# Patient Record
Sex: Male | Born: 1985 | Race: Black or African American | Hispanic: No | Marital: Single | Smoking: Former smoker
Health system: Southern US, Community
[De-identification: ages and names within clinical notes are randomized; demographics above are authoritative.]

## PROBLEM LIST (undated history)

## (undated) DIAGNOSIS — E119 Type 2 diabetes mellitus without complications: Secondary | ICD-10-CM

## (undated) DIAGNOSIS — I10 Essential (primary) hypertension: Secondary | ICD-10-CM

## (undated) DIAGNOSIS — K760 Fatty (change of) liver, not elsewhere classified: Secondary | ICD-10-CM

## (undated) DIAGNOSIS — E785 Hyperlipidemia, unspecified: Secondary | ICD-10-CM

## (undated) HISTORY — DX: Type 2 diabetes mellitus without complications: E11.9

## (undated) HISTORY — DX: Essential (primary) hypertension: I10

## (undated) HISTORY — DX: Fatty (change of) liver, not elsewhere classified: K76.0

## (undated) HISTORY — DX: Hyperlipidemia, unspecified: E78.5

---

## 2014-10-15 HISTORY — PX: FRACTURE SURGERY: SHX138

## 2016-02-06 ENCOUNTER — Ambulatory Visit (INDEPENDENT_AMBULATORY_CARE_PROVIDER_SITE_OTHER): Payer: Worker's Compensation

## 2016-02-06 ENCOUNTER — Encounter: Payer: Self-pay | Admitting: Family Medicine

## 2016-02-06 ENCOUNTER — Ambulatory Visit (INDEPENDENT_AMBULATORY_CARE_PROVIDER_SITE_OTHER): Payer: Worker's Compensation | Admitting: Family Medicine

## 2016-02-06 VITALS — BP 132/82 | HR 80 | Temp 97.0°F | Ht 79.0 in | Wt 351.0 lb

## 2016-02-06 DIAGNOSIS — M545 Low back pain: Secondary | ICD-10-CM

## 2016-02-06 DIAGNOSIS — M25561 Pain in right knee: Secondary | ICD-10-CM

## 2016-02-06 MED ORDER — CYCLOBENZAPRINE HCL 10 MG PO TABS: 10.0000 mg | ORAL_TABLET | Freq: Three times a day (TID) | ORAL | Status: DC | PRN

## 2016-02-06 NOTE — Progress Notes (Signed)
BP 132/82 mmHg  Pulse 80  Temp(Src) 97 F (36.1 C) (Oral)  Ht 6\' 7"  (2.007 m)  Wt 351 lb (159.213 kg)  BMI 39.53 kg/m2   Subjective:    Patient ID: Derek Decker, male    DOB: 07/31/1986, 30 y.o.   MRN: [id number]  HPI: Derek Decker is a 30 y.o. male presenting on 02/06/2016 for Worker's Comp Accident   HPI Worker's Comp., back pain and right leg pain Injury was on 02/04/2016, a short from [employer]. 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 Also when he fell he landed on his right side of his lower back on the concrete. He has been having sharp pains that are 8 out of 10 in his right lower back and hip and right thigh pain with certain movements but the worst of it is the back. He has taken some Advil just yesterday. He denies any numbness or weakness or pain shooting down either leg. He has had back issues previously but never quite like what he is having currently.  Relevant past medical, surgical, family and social history reviewed and updated as indicated. Interim medical history since our last visit reviewed. Allergies and medications reviewed and updated.  Review of Systems  Constitutional: Negative for fever and chills.  HENT: Negative for ear discharge and ear pain.   Eyes: Negative for discharge and visual disturbance.  Respiratory: Negative for cough, shortness of breath and wheezing.   Cardiovascular: Negative for chest pain and leg swelling.  Gastrointestinal: Negative for abdominal pain, diarrhea and constipation.  Genitourinary: Negative for difficulty urinating.  Musculoskeletal: Positive for myalgias, back pain and arthralgias. Negative for gait problem.  Skin: Negative for rash.  Neurological: Negative for syncope, weakness, light-headedness, numbness and headaches.  All other systems reviewed and are negative.   Per HPI unless specifically indicated above       Medication List       This list is accurate as of: 02/06/16 11:19 AM.  Always use your most recent med list.               acetaminophen 325 MG tablet  Commonly known as:  TYLENOL  Take 650 mg by mouth every 6 (six) hours as needed.     cyclobenzaprine 10 MG tablet  Commonly known as:  FLEXERIL  Take 1 tablet (10 mg total) by mouth 3 (three) times daily as needed for muscle spasms.     lisinopril-hydrochlorothiazide 10-12.5 MG tablet  Commonly known as:  PRINZIDE,ZESTORETIC  Take 1 tablet by mouth daily.           Objective:    BP 132/82 mmHg  Pulse 80  Temp(Src) 97 F (36.1 C) (Oral)  Ht 6\' 7"  (2.007 m)  Wt 351 lb (159.213 kg)  BMI 39.53 kg/m2  Wt Readings from Last 3 Encounters:  02/06/16 351 lb (159.213 kg)    Physical Exam  Constitutional: He is oriented to person, place, and time. He appears well-developed and well-nourished. No distress.  Eyes: Conjunctivae and EOM are normal. Pupils are equal, round, and reactive to light. Right eye exhibits no discharge. No scleral icterus.  Cardiovascular: Normal rate, regular rhythm, normal heart sounds and intact distal pulses.   No murmur heard. Pulmonary/Chest: Effort normal and breath sounds normal. No respiratory distress. He has no wheezes.  Musculoskeletal: Normal range of motion. He exhibits no  edema.       Lumbar back: He exhibits tenderness, pain and spasm. He exhibits normal range of motion, no bony tenderness, no swelling and no laceration.       Back:       Right upper leg: He exhibits tenderness.       Legs: Neurological: He is alert and oriented to person, place, and time. Coordination normal.  Skin: Skin is warm and dry. No rash noted. He is not diaphoretic.  Psychiatric: He has a normal mood and affect. His behavior is normal.  Nursing note and vitals reviewed.   Lumbar x-ray: Negative for any acute bony abnormalities, with final report radiologist. Right knee x-ray: Negative for any acute bony  abnormality, early signs of osteoarthritis, await final read by radiology.    Assessment & Plan:       Problem List Items Addressed This Visit    None    Visit Diagnoses    Low back pain, unspecified back pain laterality, with sciatica presence unspecified    -  Primary    Low back pain is the worst of his, will do one week of no lifting greater than 20 pounds and no pushing or pulling greater than 40 pounds consistently.    Relevant Medications    acetaminophen (TYLENOL) 325 MG tablet    cyclobenzaprine (FLEXERIL) 10 MG tablet    Other Relevant Orders    DG Lumbar Spine 2-3 Views    Knee pain, acute, right        Relevant Orders    DG Knee 1-2 Views Right        Follow up plan: Return if symptoms worsen or fail to improve.  Counseling provided for all of the vaccine components Orders Placed This Encounter  Procedures  . DG Knee 1-2 Views Right  . DG Lumbar Spine 2-3 Views    Arville Care, MD [facility] 02/06/2016, 11:19 AM

## 2016-02-06 NOTE — Patient Instructions (Signed)
Return as needed, Work restrictions of no lifting greater than 20 pounds consistently and no pushing or pulling greater than 40 pounds consistently. Stretching, heat, anti-inflammatory, muscle relaxer

## 2016-03-22 ENCOUNTER — Encounter: Payer: Self-pay | Admitting: Family Medicine

## 2016-03-22 ENCOUNTER — Ambulatory Visit: Payer: Worker's Compensation | Admitting: Family Medicine

## 2016-03-22 VITALS — BP 128/81 | HR 93 | Temp 97.4°F | Ht 79.0 in | Wt 349.9 lb

## 2016-03-22 DIAGNOSIS — M545 Low back pain, unspecified: Secondary | ICD-10-CM

## 2016-03-22 MED ORDER — PREDNISONE 20 MG PO TABS: ORAL_TABLET | ORAL | Status: DC

## 2016-03-22 NOTE — Progress Notes (Signed)
BP 128/81 mmHg  Pulse 93  Temp(Src) 97.4 F (36.3 C) (Oral)  Ht  (2.007 m)  Wt 349 lb 13.8 oz (158.695 kg)  BMI 39.40 kg/m2   Subjective:    Patient ID: Derek Decker, male    DOB: 1985/11/03, 30 y.o.   MRN: [id number]  HPI: Derek Decker is a 30 y.o. male presenting on 03/22/2016 for Back Pain   HPI Back pain Patient has low back pain that has been off and on and persistent for the past couple months. He went to urgent care in the emergency room over the weekend and had x-ray which showed that it was normal. He denies any tingling or numbness going down either leg.  Relevant past medical, surgical, family and social history reviewed and updated as indicated. Interim medical history since our last visit reviewed. Allergies and medications reviewed and updated.  Review of Systems  Constitutional: Negative for fever and chills.  HENT: Negative for ear discharge and ear pain.   Eyes: Negative for discharge and visual disturbance.  Respiratory: Negative for cough, shortness of breath and wheezing.   Cardiovascular: Negative for chest pain and leg swelling.  Gastrointestinal: Negative for abdominal pain, diarrhea and constipation.  Genitourinary: Negative for difficulty urinating.  Musculoskeletal: Positive for myalgias, back pain and arthralgias. Negative for gait problem.  Skin: Negative for rash.  Neurological: Negative for syncope, weakness, light-headedness, numbness and headaches.  All other systems reviewed and are negative.   Per HPI unless specifically indicated above     Medication List       This list is accurate as of: 03/22/16  3:22 PM.  Always use your most recent med list.               acetaminophen 325 MG tablet  Commonly known as:  TYLENOL  Take 650 mg by mouth every 6 (six) hours as needed.     cyclobenzaprine 10 MG tablet  Commonly known as:  FLEXERIL  Take 1 tablet (10 mg total) by mouth 3 (three) times daily as needed for muscle spasms.       lisinopril-hydrochlorothiazide 10-12.5 MG tablet  Commonly known as:  PRINZIDE,ZESTORETIC  Take 1 tablet by mouth daily.     predniSONE 20 MG tablet  Commonly known as:  DELTASONE  2 po at same time daily for 5 days           Objective:    BP 128/81 mmHg  Pulse 93  Temp(Src) 97.4 F (36.3 C) (Oral)  Ht  (2.007 m)  Wt 349 lb 13.8 oz (158.695 kg)  BMI 39.40 kg/m2  Wt Readings from Last 3 Encounters:  03/22/16 349 lb 13.8 oz (158.695 kg)  02/06/16 351 lb (159.213 kg)    Physical Exam  Constitutional: He is oriented to person, place, and time. He appears well-developed and well-nourished. No distress.  Eyes: Conjunctivae and EOM are normal. Pupils are equal, round, and reactive to light. Right eye exhibits no discharge. No scleral icterus.  Cardiovascular: Normal rate, regular rhythm, normal heart sounds and intact distal pulses.   No murmur heard. Pulmonary/Chest: Effort normal and breath sounds normal. No respiratory distress. He has no wheezes.  Musculoskeletal: Normal range of motion. He exhibits no edema.       Lumbar back: He exhibits tenderness (Bilateral paraspinal tenderness. Negative straight leg raise), pain and spasm. He exhibits normal range of motion, no bony tenderness, no swelling and no laceration.  Neurological: He is alert and  oriented to person, place, and time. Coordination normal.  Skin: Skin is warm and dry. No rash noted. He is not diaphoretic.  Psychiatric: He has a normal mood and affect. His behavior is normal.  Nursing note and vitals reviewed.   No results found for this or any previous visit.    Assessment & Plan:       Problem List Items Addressed This Visit    None    Visit Diagnoses    Bilateral low back pain without sciatica    -  Primary    Has tried Flexeril and will try a dose of prednisone, will send to physical therapy.    Relevant Medications    predniSONE (DELTASONE) 20 MG tablet    Other Relevant Orders    Ambulatory  referral to Physical Therapy        Follow up plan: Return if symptoms worsen or fail to improve.  Counseling provided for all of the vaccine components Orders Placed This Encounter  Procedures  . Ambulatory referral to Physical Therapy    Arville CareJoshua Dettinger, MD [facility] 03/22/2016, 3:22 PM

## 2016-06-19 ENCOUNTER — Encounter: Payer: Self-pay | Admitting: Family Medicine

## 2016-06-19 ENCOUNTER — Ambulatory Visit: Payer: Worker's Compensation | Admitting: Family Medicine

## 2016-06-19 VITALS — BP 128/79 | HR 86 | Temp 97.1°F | Ht 79.0 in | Wt 346.8 lb

## 2016-06-19 DIAGNOSIS — M5432 Sciatica, left side: Secondary | ICD-10-CM | POA: Diagnosis not present

## 2016-06-19 DIAGNOSIS — Z029 Encounter for administrative examinations, unspecified: Secondary | ICD-10-CM

## 2016-06-19 NOTE — Patient Instructions (Signed)
Continue your current medications, we will work on an orthopedic referral.

## 2016-06-19 NOTE — Progress Notes (Signed)
   HPI  Patient presents today for Worker's Compensation follow-up of left back pain.  Patient was injured on 02/04/2016 when fell under a conveyor belt which landed on his right mid thigh causing right leg and right back pain at that time.   He was sent for physical therapy on follow-up in June without much improvement of pain.  Pain over the last week has gotten much worse. He describes left-sided low back pain with radiation down his left lateral leg. It hurts worse with movement, twisting, and bending. He's been told there is no light duty at his job and he has been out of work for one week.  He states he's been on prednisone, Naprosyn, and Flexeril with not much improvement.  He's also had third injection at urgent care on 06/12/2016.  PMH: Smoking status noted ROS: Per HPI  Objective: BP 128/79   Pulse 86   Temp 97.1 F (36.2 C) (Oral)   Ht 6\' 7"  (2.007 m)   Wt (!) 346 lb 12.8 oz (157.3 kg)   BMI 39.07 kg/m  Gen: NAD, alert, cooperative with exam HEENT: NCAT, EOMI Ext: No edema, warm Neuro: Alert and oriented, No gross deficits  Msk: Left-sided and midline tenderness to palpation Pain with twisting to the left reproduces, also pain with bending forward at about 70.  Assessment and plan:  # Left-sided low back pain with sciatica Continue current medications Given work restrictions including not lifting or carrying more than 5 pounds or pushing or pulling more than 10 pounds. No bending Referring to orthopedic surgery.  Considering that his pain is better at rest I have not changed his medications.  If there is no light duty at his job and he will have to be out until he can be cleared by orthopedic surgery considering that his pain is unresolved after 1 week of no work and worsened with activity.    Orders Placed This Encounter  Procedures  . Ambulatory referral to Orthopedic Surgery    Referral Priority:   Routine    Referral Type:   Surgical    Referral  Reason:   Specialty Services Required    Requested Specialty:   Orthopedic Surgery    Number of Visits Requested:   1    Murtis SinkSam Irelynn Schermerhorn, MD [facility] 06/19/2016, 9:57 AM

## 2016-06-21 ENCOUNTER — Telehealth: Payer: Self-pay | Admitting: *Deleted

## 2016-06-21 NOTE — Telephone Encounter (Addendum)
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 Patient will need to have FMLA forms filled out since it is not a wc. I spoke with Bjorn Loserhonda in our billing department and she is going to check on this and verify with [employer] and then let us know.

## 2016-06-28 NOTE — Telephone Encounter (Signed)
Per [employer] they opened up the WC back up.

## 2018-04-21 DIAGNOSIS — I10 Essential (primary) hypertension: Secondary | ICD-10-CM | POA: Diagnosis not present

## 2018-04-21 DIAGNOSIS — L729 Follicular cyst of the skin and subcutaneous tissue, unspecified: Secondary | ICD-10-CM | POA: Diagnosis not present

## 2018-04-21 DIAGNOSIS — K611 Rectal abscess: Secondary | ICD-10-CM | POA: Diagnosis not present

## 2018-10-01 ENCOUNTER — Emergency Department (HOSPITAL_COMMUNITY)
Admission: EM | Admit: 2018-10-01 | Discharge: 2018-10-02 | Disposition: A | Discharge status: Home / Self Care | Payer: Self-pay | Attending: Emergency Medicine | Admitting: Emergency Medicine

## 2018-10-01 ENCOUNTER — Emergency Department (HOSPITAL_COMMUNITY): Payer: Self-pay

## 2018-10-01 ENCOUNTER — Encounter (HOSPITAL_COMMUNITY): Payer: Self-pay | Admitting: Emergency Medicine

## 2018-10-01 ENCOUNTER — Other Ambulatory Visit: Payer: Self-pay

## 2018-10-01 DIAGNOSIS — I10 Essential (primary) hypertension: Secondary | ICD-10-CM | POA: Insufficient documentation

## 2018-10-01 DIAGNOSIS — L0291 Cutaneous abscess, unspecified: Secondary | ICD-10-CM

## 2018-10-01 DIAGNOSIS — L02215 Cutaneous abscess of perineum: Secondary | ICD-10-CM | POA: Insufficient documentation

## 2018-10-01 DIAGNOSIS — Z87891 Personal history of nicotine dependence: Secondary | ICD-10-CM | POA: Insufficient documentation

## 2018-10-01 LAB — COMPREHENSIVE METABOLIC PANEL
ALT: 35 U/L (ref 0–44)
AST: 22 U/L (ref 15–41)
Albumin: 4.1 g/dL (ref 3.5–5.0)
Alkaline Phosphatase: 51 U/L (ref 38–126)
Anion gap: 10 (ref 5–15)
BUN: 9 mg/dL (ref 6–20)
CO2: 24 mmol/L (ref 22–32)
Calcium: 8.9 mg/dL (ref 8.9–10.3)
Chloride: 104 mmol/L (ref 98–111)
Creatinine, Ser: 0.87 mg/dL (ref 0.61–1.24)
GFR calc non Af Amer: >60 mL/min (ref >60)
Glucose, Bld: 204 mg/dL — ABNORMAL HIGH (ref 70–99)
POTASSIUM: 3.7 mmol/L (ref 3.5–5.1)
Sodium: 138 mmol/L (ref 135–145)
Total Bilirubin: 1.5 mg/dL — ABNORMAL HIGH (ref 0.3–1.2)
Total Protein: 7.2 g/dL (ref 6.5–8.1)

## 2018-10-01 LAB — CBC WITH DIFFERENTIAL/PLATELET
Abs Immature Granulocytes: 0.04 10*3/uL (ref 0.00–0.07)
BASOS PCT: 0 %
Basophils Absolute: 0 10*3/uL (ref 0.0–0.1)
EOS ABS: 0.1 10*3/uL (ref 0.0–0.5)
Eosinophils Relative: 1 %
HCT: 45.8 % (ref 39.0–52.0)
Hemoglobin: 14.4 g/dL (ref 13.0–17.0)
Immature Granulocytes: 0 %
Lymphocytes Relative: 18 %
Lymphs Abs: 1.8 10*3/uL (ref 0.7–4.0)
MCH: 29.1 pg (ref 26.0–34.0)
MCHC: 31.4 g/dL (ref 30.0–36.0)
MCV: 92.7 fL (ref 80.0–100.0)
Monocytes Absolute: 0.9 10*3/uL (ref 0.1–1.0)
Monocytes Relative: 8 %
Neutro Abs: 7.4 10*3/uL (ref 1.7–7.7)
Neutrophils Relative %: 73 %
PLATELETS: 227 10*3/uL (ref 150–400)
RBC: 4.94 MIL/uL (ref 4.22–5.81)
RDW: 11.8 % (ref 11.5–15.5)
WBC: 10.3 10*3/uL (ref 4.0–10.5)
nRBC: 0 % (ref 0.0–0.2)

## 2018-10-01 MED ORDER — LIDOCAINE HCL (PF) 2 % IJ SOLN
INTRAMUSCULAR | Status: AC
  Filled 2018-10-01: qty 10

## 2018-10-01 MED ORDER — TRAMADOL HCL 50 MG PO TABS: 50.0000 mg | ORAL_TABLET | Freq: Four times a day (QID) | ORAL | 0 refills | Status: DC | PRN

## 2018-10-01 MED ORDER — POVIDONE-IODINE 10 % EX SOLN
CUTANEOUS | Status: AC
  Filled 2018-10-01: qty 15

## 2018-10-01 MED ORDER — CIPROFLOXACIN HCL 250 MG PO TABS
500.0000 mg | ORAL_TABLET | Freq: Once | ORAL | Status: AC
  Administered 2018-10-01: 500 mg via ORAL
  Filled 2018-10-01: qty 2

## 2018-10-01 MED ORDER — HYDROMORPHONE HCL 1 MG/ML IJ SOLN
1.0000 mg | Freq: Once | INTRAMUSCULAR | Status: AC
  Administered 2018-10-01: 1 mg via INTRAVENOUS
  Filled 2018-10-01: qty 1

## 2018-10-01 MED ORDER — METRONIDAZOLE 500 MG PO TABS
500.0000 mg | ORAL_TABLET | Freq: Once | ORAL | Status: AC
  Administered 2018-10-01: 500 mg via ORAL
  Filled 2018-10-01: qty 1

## 2018-10-01 MED ORDER — SODIUM CHLORIDE 0.9 % IV SOLN
1.0000 g | Freq: Once | INTRAVENOUS | Status: AC
  Administered 2018-10-01: 1 g via INTRAVENOUS
  Filled 2018-10-01: qty 10

## 2018-10-01 MED ORDER — CIPROFLOXACIN HCL 500 MG PO TABS: 500.0000 mg | ORAL_TABLET | Freq: Two times a day (BID) | ORAL | 0 refills | Status: DC

## 2018-10-01 MED ORDER — LORAZEPAM 2 MG/ML IJ SOLN
0.5000 mg | Freq: Once | INTRAMUSCULAR | Status: AC
  Administered 2018-10-01: 0.5 mg via INTRAVENOUS
  Filled 2018-10-01: qty 1

## 2018-10-01 MED ORDER — METRONIDAZOLE 500 MG PO TABS: 500.0000 mg | ORAL_TABLET | Freq: Two times a day (BID) | ORAL | 0 refills | Status: DC

## 2018-10-01 MED ORDER — IOPAMIDOL (ISOVUE-300) INJECTION 61%
100.0000 mL | Freq: Once | INTRAVENOUS | Status: AC | PRN
  Administered 2018-10-01: 100 mL via INTRAVENOUS

## 2018-10-01 NOTE — ED Triage Notes (Signed)
Patient reports an abscess behind the left testicle. Onset 1 week ago. Patient reports edema today.

## 2018-10-01 NOTE — Discharge Instructions (Signed)
Clean area twice a day and sit in warm bathtub twice a day.  Follow-up with Dr. Lovell SheehanJenkins on Tuesday.  Return here sooner if any problems.  Also you need to get a family doctor to follow your sugar which was slightly elevated and your blood pressure at that is slightly elevated

## 2018-10-01 NOTE — ED Provider Notes (Signed)
[facility]   CSN: [id number] Arrival date & time: 10/01/18  1640     History   Chief Complaint Chief Complaint  Patient presents with  . Abscess    HPI Earma ReadingKevin T Hatchel is a 32 y.o. male.  Patient complains of swelling between his anus and scrotum.  The history is provided by the patient. No language interpreter was used.  Illness  This is a new problem. The current episode started more than 2 days ago. The problem occurs constantly. The problem has not changed since onset.Pertinent negatives include no chest pain, no abdominal pain and no headaches. Nothing aggravates the symptoms. Nothing relieves the symptoms. He has tried nothing for the symptoms. The treatment provided no relief.    Past Medical History:  Diagnosis Date  . Hypertension     There are no active problems to display for this patient.   Past Surgical History:  Procedure Laterality Date  . FRACTURE SURGERY Left 2016   left foot        Home Medications    Prior to Admission medications   Medication Sig Start Date End Date Taking? Authorizing Provider  ciprofloxacin (CIPRO) 500 MG tablet Take 1 tablet (500 mg total) by mouth 2 (two) times daily. One po bid x 7 days 10/01/18   Bethann BerkshireZammit, Placido Hangartner, MD  metroNIDAZOLE (FLAGYL) 500 MG tablet Take 1 tablet (500 mg total) by mouth 2 (two) times daily. One po bid x 7 days 10/01/18   Bethann BerkshireZammit, Yashira Offenberger, MD  traMADol (ULTRAM) 50 MG tablet Take 1 tablet (50 mg total) by mouth every 6 (six) hours as needed. 10/01/18   Bethann BerkshireZammit, Jalaine Riggenbach, MD    Family History Family History  Problem Relation Age of Onset  . Hypertension Mother   . Hypertension Father     Social History Social History   Tobacco Use  . Smoking status: Former Smoker    Packs/day: 0.25    Years: 3.00    Pack years: 0.75  . Smokeless tobacco: Never Used  Substance Use Topics  . Alcohol use: No  . Drug use: No     Allergies   Patient has no known  allergies.   Review of Systems Review of Systems  Constitutional: Negative for appetite change and fatigue.  HENT: Negative for congestion, ear discharge and sinus pressure.   Eyes: Negative for discharge.  Respiratory: Negative for cough.   Cardiovascular: Negative for chest pain.  Gastrointestinal: Negative for abdominal pain and diarrhea.  Genitourinary: Negative for frequency and hematuria.  Musculoskeletal: Negative for back pain.  Skin: Negative for rash.       Swelling and pain between his anus and scrotum  Neurological: Negative for seizures and headaches.  Psychiatric/Behavioral: Negative for hallucinations.     Physical Exam Updated Vital Signs BP (!) 149/92 (BP Location: Right Arm) Comment: told rn high bp  Pulse (!) 104 Comment: told rn high pulse   Temp 100.2 F (37.9 C) (Oral) Comment (Src): told rn temp up  Resp 20   Ht 6\' 7"  (2.007 m)   Wt (!) 148.3 kg   SpO2 97%   BMI 36.84 kg/m   Physical Exam Constitutional:      Appearance: He is well-developed.  HENT:     Head: Normocephalic.     Nose: Nose normal.  Eyes:     General: No scleral icterus.    Conjunctiva/sclera: Conjunctivae normal.  Neck:     Musculoskeletal: Neck supple.     Thyroid:  No thyromegaly.  Cardiovascular:     Rate and Rhythm: Normal rate and regular rhythm.     Heart sounds: No murmur. No friction rub. No gallop.   Pulmonary:     Breath sounds: No stridor. No wheezing or rales.  Chest:     Chest wall: No tenderness.  Abdominal:     General: There is no distension.     Tenderness: There is no abdominal tenderness. There is no rebound.  Genitourinary:    Comments: Patient has tenderness and swelling to the perineal area Musculoskeletal: Normal range of motion.  Lymphadenopathy:     Cervical: No cervical adenopathy.  Skin:    Findings: No erythema or rash.  Neurological:     Mental Status: He is oriented to person, place, and time.     Motor: No abnormal muscle tone.      Coordination: Coordination normal.  Psychiatric:        Behavior: Behavior normal.      ED Treatments / Results  Labs (all labs ordered are listed, but only abnormal results are displayed) Labs Reviewed  COMPREHENSIVE METABOLIC PANEL - Abnormal; Notable for the following components:      Result Value   Glucose, Bld 204 (*)    Total Bilirubin 1.5 (*)    All other components within normal limits  CBC WITH DIFFERENTIAL/PLATELET    EKG None  Radiology Ct Abdomen Pelvis W Contrast  Result Date: 10/01/2018 CLINICAL DATA:  Abscess behind the left testicle for the past week. Worsening edema today. EXAM: CT ABDOMEN AND PELVIS WITH CONTRAST TECHNIQUE: Multidetector CT imaging of the abdomen and pelvis was performed using the standard protocol following bolus administration of intravenous contrast. CONTRAST:  ISOVUE-300 IOPAMIDOL (ISOVUE-300) INJECTION 61% COMPARISON:  None. FINDINGS: Lower chest: Limited visualization of the lower thorax is negative for focal airspace opacity or pleural effusion. Normal heart size.  No pericardial effusion. Hepatobiliary: Normal hepatic contour. There is diffuse decreased attenuation of the hepatic parenchyma on this postcontrast examination suggestive of hepatic steatosis. No discrete hepatic lesions. Normal appearance of the gallbladder given degree distention. No radiopaque gallstones. No intra extrahepatic biliary duct dilatation. No ascites. Pancreas: Normal appearance of the pancreas. Spleen: Normal appearance of the spleen Adrenals/Urinary Tract: There is symmetric enhancement of the bilateral kidneys. No definite renal stones. No urine obstruction or perinephric stranding. Normal appearance the bilateral adrenal glands. Normal appearance of the urinary bladder given degree distention. Stomach/Bowel: Scattered colonic diverticulosis without evidence of superimposed acute diverticulitis. Moderate colonic stool burden without evidence of enteric  obstruction. Normal appearance of the terminal ileum and the retrocecal appendix. No pneumoperitoneum, pneumatosis or portal venous gas. Vascular/Lymphatic: Normal caliber of the abdominal aorta. The major branch vessels of the abdominal aorta appear patent on this non CTA examination. Scattered retroperitoneal lymph nodes are numerous though individually not enlarged by size criteria. No bulky retroperitoneal, mesenteric, pelvic or inguinal lymphadenopathy. Reproductive: No free fluid in the pelvic cul-de-sac. Other: There is a approximately 5.1 x 2.6 x 3.4 cm peripherally enhancing abscess within the perineum, anterior to the anus (axial image 109, series 2; sagittal image 72, series 6). Expected adjacent subcutaneous stranding. No subcutaneous emphysema. There is no definitive communication between this apparent perineal abscess and the undersurface of the base of the penis. Musculoskeletal: No acute or aggressive osseous abnormalities. Moderate multilevel lumbar spine DDD, worse at L3-L4 and L4-L5 with disc space height loss, endplate irregularity and sclerosis. IMPRESSION: 1. Approximately 5.1 cm suspected abscess within the perineum, anterior  to the anus, without subcutaneous emphysema or definitive communication with the base of the penis. 2. Advanced hepatic steatosis.  Correlation with LFTs is advised. 3. Colonic diverticulosis without evidence of superimposed acute diverticulitis. Electronically Signed   By: Simonne Come M.D.   On: 10/01/2018 21:34    Procedures .Marland KitchenIncision and Drainage Date/Time: 10/01/2018 11:18 PM Performed by: Bethann Berkshire, MD Authorized by: Bethann Berkshire, MD   Consent:    Consent obtained:  Verbal   Consent given by:  Patient   Risks discussed:  Bleeding   Alternatives discussed:  No treatment Location:    Type:  Abscess   Size:  5 x 3 x 3 cm   Location: Perineum. Pre-procedure details:    Skin preparation:  Antiseptic wash Sedation:    Sedation type: Dilaudid  and Ativan. Procedure type:    Complexity:  Simple Procedure details:    Incision types:  Single straight   Scalpel blade:  11   Wound management:  Probed and deloculated   Drainage:  Bloody   Drainage amount:  Moderate   Wound treatment:  Wound left open   Packing materials:  None Post-procedure details:    Patient tolerance of procedure:  Tolerated well, no immediate complications Comments:     Patient had incision that was about 2 and half centimeters in his perineum.  Area was probed and deloculated.  No obvious pus drained.  Only blood   (including critical care time)  Medications Ordered in ED Medications  lidocaine (XYLOCAINE) 2 % injection (has no administration in time range)  lidocaine (XYLOCAINE) 2 % injection (has no administration in time range)  povidone-iodine (BETADINE) 10 % external solution (has no administration in time range)  metroNIDAZOLE (FLAGYL) tablet 500 mg (has no administration in time range)  ciprofloxacin (CIPRO) tablet 500 mg (has no administration in time range)  cefTRIAXone (ROCEPHIN) 1 g in sodium chloride 0.9 % 100 mL IVPB (0 g Intravenous Stopped 10/01/18 2133)  iopamidol (ISOVUE-300) 61 % injection 100 mL (100 mLs Intravenous Contrast Given 10/01/18 2114)  HYDROmorphone (DILAUDID) injection 1 mg (1 mg Intravenous Given 10/01/18 2207)  LORazepam (ATIVAN) injection 0.5 mg (0.5 mg Intravenous Given 10/01/18 2206)     Initial Impression / Assessment and Plan / ED Course  I have reviewed the triage vital signs and the nursing notes.  Pertinent labs & imaging results that were available during my care of the patient were reviewed by me and considered in my medical decision making (see chart for details).     Patient has abscess to perineum.  I spoke with Dr. Lovell Sheehan general surgery.  And then I went ahead and I&D the area.  I did not get any pus out.  I spoke again with Dr. Lovell Sheehan and he wants patient on Flagyl and Cipro and will follow him up  Tuesday.  Patient is also been told to return sooner if any problems and Dr. Kathrine Cords said he is on call rest of the week  Final Clinical Impressions(s) / ED Diagnoses   Final diagnoses:  Abscess    ED Discharge Orders         Ordered    ciprofloxacin (CIPRO) 500 MG tablet  2 times daily     10/01/18 2312    metroNIDAZOLE (FLAGYL) 500 MG tablet  2 times daily     10/01/18 2312    traMADol (ULTRAM) 50 MG tablet  Every 6 hours PRN     10/01/18 2312  Bethann Berkshire, MD 10/01/18 2320

## 2018-10-02 NOTE — ED Notes (Signed)
Dressing placed to I&D site.  Patient tolerated well.

## 2018-11-28 DIAGNOSIS — I10 Essential (primary) hypertension: Secondary | ICD-10-CM | POA: Diagnosis not present

## 2018-11-28 DIAGNOSIS — R748 Abnormal levels of other serum enzymes: Secondary | ICD-10-CM | POA: Diagnosis not present

## 2018-12-25 DIAGNOSIS — E119 Type 2 diabetes mellitus without complications: Secondary | ICD-10-CM | POA: Diagnosis not present

## 2018-12-25 DIAGNOSIS — I10 Essential (primary) hypertension: Secondary | ICD-10-CM | POA: Diagnosis not present

## 2018-12-25 DIAGNOSIS — R748 Abnormal levels of other serum enzymes: Secondary | ICD-10-CM | POA: Diagnosis not present

## 2019-02-25 ENCOUNTER — Other Ambulatory Visit: Payer: Self-pay | Admitting: *Deleted

## 2019-02-26 DIAGNOSIS — I10 Essential (primary) hypertension: Secondary | ICD-10-CM | POA: Diagnosis not present

## 2019-02-26 DIAGNOSIS — E119 Type 2 diabetes mellitus without complications: Secondary | ICD-10-CM | POA: Diagnosis not present

## 2019-02-26 DIAGNOSIS — R748 Abnormal levels of other serum enzymes: Secondary | ICD-10-CM | POA: Diagnosis not present

## 2019-07-09 ENCOUNTER — Other Ambulatory Visit: Payer: Self-pay

## 2019-07-10 ENCOUNTER — Encounter: Payer: Self-pay | Admitting: Family Medicine

## 2019-07-10 ENCOUNTER — Ambulatory Visit (INDEPENDENT_AMBULATORY_CARE_PROVIDER_SITE_OTHER): Payer: BC Managed Care – PPO | Admitting: Family Medicine

## 2019-07-10 VITALS — BP 135/90 | HR 85 | Temp 97.1°F | Resp 20 | Ht 79.0 in | Wt 342.8 lb

## 2019-07-10 DIAGNOSIS — E119 Type 2 diabetes mellitus without complications: Secondary | ICD-10-CM

## 2019-07-10 DIAGNOSIS — I10 Essential (primary) hypertension: Secondary | ICD-10-CM

## 2019-07-10 DIAGNOSIS — Z23 Encounter for immunization: Secondary | ICD-10-CM | POA: Diagnosis not present

## 2019-07-10 DIAGNOSIS — I152 Hypertension secondary to endocrine disorders: Secondary | ICD-10-CM

## 2019-07-10 DIAGNOSIS — E1159 Type 2 diabetes mellitus with other circulatory complications: Secondary | ICD-10-CM

## 2019-07-10 LAB — BAYER DCA HB A1C WAIVED: HB A1C (BAYER DCA - WAIVED): 6.7 % (ref <7.0)

## 2019-07-10 MED ORDER — METFORMIN HCL 500 MG PO TABS: 500.0000 mg | ORAL_TABLET | Freq: Two times a day (BID) | ORAL | 2 refills | Status: DC

## 2019-07-10 MED ORDER — LISINOPRIL-HYDROCHLOROTHIAZIDE 10-12.5 MG PO TABS: 1.0000 | ORAL_TABLET | Freq: Every day | ORAL | 2 refills | Status: DC

## 2019-07-10 NOTE — Patient Instructions (Addendum)

## 2019-07-10 NOTE — Progress Notes (Signed)
Subjective:  Patient ID: Derek Decker, male    DOB: 21-Jul-1986, 33 y.o.   MRN: [id number]  Patient Care Team: Dettinger, Fransisca Kaufmann, MD as PCP - General (Family Medicine)   Chief Complaint:  New Patient (Initial Visit)   HPI: Derek Decker is a 33 y.o. male presenting on 07/10/2019 for New Patient (Initial Visit)   Pt presents today to establish care with new PCP as his PCP has retired. Pt states he has a history of type 2 diabetes and hypertension. Pt states he checks his blood sugars daily and they have been running in the 160-180 range over the last few weeks. States he has been out of his medications for three weeks. States his blood pressure is usually good. He states he has had a slight headache recently but controls this with tylenol as needed. He denies chest pain, palpitations, leg swelling, or dizziness. States weight has been stable. He tries to watch is diet and is active daily. States he does not have a set exercise routine. His last A1C was 7.2 on 02/26/2019.    Relevant past medical, surgical, family, and social history reviewed and updated as indicated.  Allergies and medications reviewed and updated. Date reviewed: Chart in Epic.   Past Medical History:  Diagnosis Date   Hypertension     Past Surgical History:  Procedure Laterality Date   FRACTURE SURGERY Left 2016   left foot    Social History   Socioeconomic History   Marital status: Single    Spouse name: Not on file   Number of children: Not on file   Years of education: Not on file   Highest education level: Not on file  Occupational History   Not on file  Social Needs   Financial resource strain: Not on file   Food insecurity    Worry: Not on file    Inability: Not on file   Transportation needs    Medical: Not on file    Non-medical: Not on file  Tobacco Use   Smoking status: Former Smoker    Packs/day: 0.25    Years: 3.00    Pack years: 0.75   Smokeless tobacco: Never Used    Substance and Sexual Activity   Alcohol use: No   Drug use: No   Sexual activity: Not on file  Lifestyle   Physical activity    Days per week: Not on file    Minutes per session: Not on file   Stress: Not on file  Relationships   Social connections    Talks on phone: Not on file    Gets together: Not on file    Attends religious service: Not on file    Active member of club or organization: Not on file    Attends meetings of clubs or organizations: Not on file    Relationship status: Not on file   Intimate partner violence    Fear of current or ex partner: Not on file    Emotionally abused: Not on file    Physically abused: Not on file    Forced sexual activity: Not on file  Other Topics Concern   Not on file  Social History Narrative   Not on file    Outpatient Encounter Medications as of 07/10/2019  Medication Sig   glucose blood (ONETOUCH VERIO) test strip USE 1 STRIP TO CHECK GLUCOSE ONCE DAILY   lisinopril-hydrochlorothiazide (ZESTORETIC) 10-12.5 MG tablet Take 1 tablet by mouth daily.  metFORMIN (GLUCOPHAGE) 500 MG tablet Take 1 tablet (500 mg total) by mouth 2 (two) times daily.   [DISCONTINUED] lisinopril-hydrochlorothiazide (ZESTORETIC) 10-12.5 MG tablet Take 10-12.5 tablets by mouth daily.   [DISCONTINUED] metFORMIN (GLUCOPHAGE) 500 MG tablet Take 500 mg by mouth 2 (two) times daily.   No facility-administered encounter medications on file as of 07/10/2019.     No Known Allergies  Review of Systems  Constitutional: Negative for activity change, appetite change, chills, diaphoresis, fatigue, fever and unexpected weight change.  HENT: Negative.   Eyes: Negative.  Negative for photophobia and visual disturbance.  Respiratory: Negative for cough, chest tightness and shortness of breath.   Cardiovascular: Negative for chest pain, palpitations and leg swelling.  Gastrointestinal: Negative for abdominal pain, blood in stool, constipation, diarrhea,  nausea and vomiting.  Endocrine: Negative.  Negative for polydipsia, polyphagia and polyuria.  Genitourinary: Negative for decreased urine volume, difficulty urinating, dysuria, frequency and urgency.  Musculoskeletal: Negative for arthralgias and myalgias.  Skin: Negative.   Allergic/Immunologic: Negative.   Neurological: Positive for headaches. Negative for dizziness, tremors, seizures, syncope, facial asymmetry, speech difficulty, weakness, light-headedness and numbness.  Hematological: Negative.   Psychiatric/Behavioral: Negative for confusion, hallucinations, sleep disturbance and suicidal ideas.  All other systems reviewed and are negative.       Objective:  BP 135/90    Pulse 85    Temp (!) 97.1 F (36.2 C) (Temporal)    Resp 20    Ht '6\' 7"'  (2.007 m)    Wt (!) 342 lb 12.8 oz (155.5 kg)    SpO2 98%    BMI 38.62 kg/m    Wt Readings from Last 3 Encounters:  07/10/19 (!) 342 lb 12.8 oz (155.5 kg)  10/01/18 (!) 327 lb (148.3 kg)  06/19/16 (!) 346 lb 12.8 oz (157.3 kg)    Physical Exam Vitals signs and nursing note reviewed.  Constitutional:      General: He is not in acute distress.    Appearance: Normal appearance. He is well-developed and well-groomed. He is obese. He is not ill-appearing, toxic-appearing or diaphoretic.  HENT:     Head: Normocephalic and atraumatic.     Jaw: There is normal jaw occlusion.     Right Ear: Hearing, tympanic membrane, ear canal and external ear normal.     Left Ear: Hearing, tympanic membrane, ear canal and external ear normal.     Nose: Nose normal.     Mouth/Throat:     Lips: Pink.     Mouth: Mucous membranes are moist.     Pharynx: Oropharynx is clear. Uvula midline.  Eyes:     General: Lids are normal.     Extraocular Movements: Extraocular movements intact.     Conjunctiva/sclera: Conjunctivae normal.     Pupils: Pupils are equal, round, and reactive to light.  Neck:     Musculoskeletal: Normal range of motion and neck supple.      Thyroid: No thyroid mass, thyromegaly or thyroid tenderness.     Vascular: No carotid bruit or JVD.     Trachea: Trachea and phonation normal.  Cardiovascular:     Rate and Rhythm: Normal rate and regular rhythm.     Chest Wall: PMI is not displaced.     Pulses: Normal pulses.     Heart sounds: Normal heart sounds. No murmur. No friction rub. No gallop.   Pulmonary:     Effort: Pulmonary effort is normal. No respiratory distress.     Breath sounds: Normal breath sounds.  No wheezing.  Abdominal:     General: Bowel sounds are normal. There is no distension or abdominal bruit.     Palpations: Abdomen is soft. There is no hepatomegaly or splenomegaly.     Tenderness: There is no abdominal tenderness. There is no right CVA tenderness or left CVA tenderness.     Hernia: No hernia is present.  Musculoskeletal: Normal range of motion.     Right lower leg: No edema.     Left lower leg: No edema.  Lymphadenopathy:     Cervical: No cervical adenopathy.  Skin:    General: Skin is warm and dry.     Capillary Refill: Capillary refill takes less than 2 seconds.     Coloration: Skin is not cyanotic, jaundiced or pale.     Findings: No rash.  Neurological:     General: No focal deficit present.     Mental Status: He is alert and oriented to person, place, and time.     Cranial Nerves: Cranial nerves are intact. No cranial nerve deficit.     Sensory: Sensation is intact. No sensory deficit.     Motor: Motor function is intact. No weakness.     Coordination: Coordination is intact. Coordination normal.     Gait: Gait is intact. Gait normal.     Deep Tendon Reflexes: Reflexes are normal and symmetric. Reflexes normal.  Psychiatric:        Attention and Perception: Attention and perception normal.        Mood and Affect: Mood and affect normal.        Speech: Speech normal.        Behavior: Behavior normal. Behavior is cooperative.        Thought Content: Thought content normal.        Cognition  and Memory: Cognition and memory normal.        Judgment: Judgment normal.     Results for orders placed or performed during the hospital encounter of 10/01/18  CBC with Differential/Platelet  Result Value Ref Range   WBC 10.3 4.0 - 10.5 K/uL   RBC 4.94 4.22 - 5.81 MIL/uL   Hemoglobin 14.4 13.0 - 17.0 g/dL   HCT 45.8 39.0 - 52.0 %   MCV 92.7 80.0 - 100.0 fL   MCH 29.1 26.0 - 34.0 pg   MCHC 31.4 30.0 - 36.0 g/dL   RDW 11.8 11.5 - 15.5 %   Platelets 227 150 - 400 K/uL   nRBC 0.0 0.0 - 0.2 %   Neutrophils Relative % 73 %   Neutro Abs 7.4 1.7 - 7.7 K/uL   Lymphocytes Relative 18 %   Lymphs Abs 1.8 0.7 - 4.0 K/uL   Monocytes Relative 8 %   Monocytes Absolute 0.9 0.1 - 1.0 K/uL   Eosinophils Relative 1 %   Eosinophils Absolute 0.1 0.0 - 0.5 K/uL   Basophils Relative 0 %   Basophils Absolute 0.0 0.0 - 0.1 K/uL   Immature Granulocytes 0 %   Abs Immature Granulocytes 0.04 0.00 - 0.07 K/uL  Comprehensive metabolic panel  Result Value Ref Range   Sodium 138 135 - 145 mmol/L   Potassium 3.7 3.5 - 5.1 mmol/L   Chloride 104 98 - 111 mmol/L   CO2 24 22 - 32 mmol/L   Glucose, Bld 204 (H) 70 - 99 mg/dL   BUN 9 6 - 20 mg/dL   Creatinine, Ser 0.87 0.61 - 1.24 mg/dL   Calcium 8.9 8.9 - 10.3 mg/dL  Total Protein 7.2 6.5 - 8.1 g/dL   Albumin 4.1 3.5 - 5.0 g/dL   AST 22 15 - 41 U/L   ALT 35 0 - 44 U/L   Alkaline Phosphatase 51 38 - 126 U/L   Total Bilirubin 1.5 (H) 0.3 - 1.2 mg/dL   GFR calc non Af Amer >60 >60 mL/min   GFR calc Af Amer >60 >60 mL/min   Anion gap 10 5 - 15       Pertinent labs & imaging results that were available during my care of the patient were reviewed by me and considered in my medical decision making.  Assessment & Plan:  Gardy was seen today for new patient (initial visit).  Diagnoses and all orders for this visit:  Type 2 diabetes mellitus without complication, without long-term current use of insulin ([place]) Lab Results  Component Value Date   HGBA1C  6.7 07/10/2019    Diabetes Control: well, A1C 6.7 today Instruction/counseling given: reminded to get eye exam, reminded to bring blood glucose meter & log to each visit, reminded to bring medications to each visit, discussed foot care, discussed the need for weight loss, discussed diet, discussed sick day management and provided printed educational material   1.  Rx changes: none 2.  Education: Reviewed ABCs of diabetes management (respective goals in parentheses):  A1C (<7), blood pressure (<130/80), BMI (<25), and cholesterol (LDL <100). 3.  Discussed pathophysiology of DM; difference between type 1 and type 2 DM. 4.  CHO counting diet discussed.  Reviewed CHO amount in various foods and how to read nutrition labels.  Discussed recommended serving sizes.  5.  Recommend check BG several times a week and record readings to bring to next appointment. Report persistent high or low readings. 6.  Recommended increase physical activity - goal is 150 minutes per week and advance as tolerated. 7.  Adequate sleep, at least 6-8 hours per night.  8.  Smoking cessation.  9.  Follow up: 3 months   -     metFORMIN (GLUCOPHAGE) 500 MG tablet; Take 1 tablet (500 mg total) by mouth 2 (two) times daily. -     CMP14+EGFR -     CBC with Differential/Platelet -     Lipid panel -     Thyroid Panel With TSH -     Microalbumin / creatinine urine ratio -     Bayer DCA Hb A1c Waived  Hypertension associated with type 2 diabetes mellitus (HCC) BP fairly controlled. Changes were not made in regimen today as pt has been out of medications for 3 weeks. Daily blood pressure log given with instructions on how to fill out and told to bring to next visit. Goal BP 130/80. Pt aware to report any persistent high or low readings. DASH diet and exercise encouraged. Exercise at least 150 minutes per week and increase as tolerated. Goal BMI > 25. Stress management encouraged. Smoking cessation discussed. Avoid excessive alcohol.  Avoid NSAID's. Avoid more than 2000 mg of sodium daily. Medications as prescribed. Follow up as scheduled.  -     lisinopril-hydrochlorothiazide (ZESTORETIC) 10-12.5 MG tablet; Take 1 tablet by mouth daily. -     CMP14+EGFR -     CBC with Differential/Platelet -     Lipid panel -     Thyroid Panel With TSH -     Microalbumin / creatinine urine ratio  Morbid obesity (HCC) Diet and exercise encouraged. Labs pending.  -     CMP14+EGFR -  CBC with Differential/Platelet -     Lipid panel -     Thyroid Panel With TSH -     Microalbumin / creatinine urine ratio  Need for immunization against influenza -     Flu Vaccine QUAD 36+ mos IM     Continue all other maintenance medications.  Follow up plan: Return in about 3 months (around 10/09/2019), or if symptoms worsen or fail to improve, for DM.  Continue healthy lifestyle choices, including diet (rich in fruits, vegetables, and lean proteins, and low in salt and simple carbohydrates) and exercise (at least 30 minutes of moderate physical activity daily).  Educational handout given for DM  The above assessment and management plan was discussed with the patient. The patient verbalized understanding of and has agreed to the management plan. Patient is aware to call the clinic if they develop any new symptoms or if symptoms persist or worsen. Patient is aware when to return to the clinic for a follow-up visit. Patient educated on when it is appropriate to go to the emergency department.   Monia Pouch, FNP-C [facility] [phone]

## 2019-07-11 LAB — CBC WITH DIFFERENTIAL/PLATELET
Basophils Absolute: 0.1 10*3/uL (ref 0.0–0.2)
Basos: 1 %
EOS (ABSOLUTE): 0.4 10*3/uL (ref 0.0–0.4)
Eos: 5 %
Hematocrit: 44.6 % (ref 37.5–51.0)
Hemoglobin: 15 g/dL (ref 13.0–17.7)
Immature Grans (Abs): 0.1 10*3/uL (ref 0.0–0.1)
Immature Granulocytes: 1 %
Lymphocytes Absolute: 2.8 10*3/uL (ref 0.7–3.1)
Lymphs: 33 %
MCH: 29.4 pg (ref 26.6–33.0)
MCHC: 33.6 g/dL (ref 31.5–35.7)
MCV: 87 fL (ref 79–97)
Monocytes Absolute: 0.4 10*3/uL (ref 0.1–0.9)
Monocytes: 5 %
Neutrophils Absolute: 4.6 10*3/uL (ref 1.4–7.0)
Neutrophils: 55 %
Platelets: 333 10*3/uL (ref 150–450)
RBC: 5.11 x10E6/uL (ref 4.14–5.80)
RDW: 11.7 % (ref 11.6–15.4)
WBC: 8.4 10*3/uL (ref 3.4–10.8)

## 2019-07-11 LAB — MICROALBUMIN / CREATININE URINE RATIO
Creatinine, Urine: 150.9 mg/dL
Microalb/Creat Ratio: 115 mg/g creat — ABNORMAL HIGH (ref 0–29)
Microalbumin, Urine: 173.2 ug/mL

## 2019-07-11 LAB — CMP14+EGFR
ALT: 68 IU/L — ABNORMAL HIGH (ref 0–44)
AST: 44 IU/L — ABNORMAL HIGH (ref 0–40)
Albumin/Globulin Ratio: 1.7 (ref 1.2–2.2)
Albumin: 4.5 g/dL (ref 4.0–5.0)
Alkaline Phosphatase: 60 IU/L (ref 39–117)
BUN/Creatinine Ratio: 11 (ref 9–20)
BUN: 10 mg/dL (ref 6–20)
Bilirubin Total: 0.5 mg/dL (ref 0.0–1.2)
CO2: 20 mmol/L (ref 20–29)
Calcium: 9.7 mg/dL (ref 8.7–10.2)
Chloride: 102 mmol/L (ref 96–106)
Creatinine, Ser: 0.92 mg/dL (ref 0.76–1.27)
GFR calc Af Amer: 125 mL/min/{1.73_m2} (ref >59)
GFR calc non Af Amer: 108 mL/min/{1.73_m2} (ref >59)
Globulin, Total: 2.6 g/dL (ref 1.5–4.5)
Glucose: 122 mg/dL — ABNORMAL HIGH (ref 65–99)
Potassium: 4.6 mmol/L (ref 3.5–5.2)
Sodium: 141 mmol/L (ref 134–144)
Total Protein: 7.1 g/dL (ref 6.0–8.5)

## 2019-07-11 LAB — LIPID PANEL
Chol/HDL Ratio: 3.3 ratio (ref 0.0–5.0)
Cholesterol, Total: 161 mg/dL (ref 100–199)
HDL: 49 mg/dL (ref >39)
LDL Chol Calc (NIH): 89 mg/dL (ref 0–99)
Triglycerides: 128 mg/dL (ref 0–149)
VLDL Cholesterol Cal: 23 mg/dL (ref 5–40)

## 2019-07-11 LAB — THYROID PANEL WITH TSH
Free Thyroxine Index: 2.4 (ref 1.2–4.9)
T3 Uptake Ratio: 27 % (ref 24–39)
T4, Total: 8.9 ug/dL (ref 4.5–12.0)
TSH: 0.86 u[IU]/mL (ref 0.450–4.500)

## 2019-10-12 ENCOUNTER — Other Ambulatory Visit: Payer: Self-pay

## 2019-10-12 ENCOUNTER — Ambulatory Visit (INDEPENDENT_AMBULATORY_CARE_PROVIDER_SITE_OTHER): Payer: BC Managed Care – PPO | Admitting: Family Medicine

## 2019-10-12 ENCOUNTER — Encounter: Payer: Self-pay | Admitting: Family Medicine

## 2019-10-12 VITALS — BP 133/86 | HR 106 | Temp 97.1°F | Resp 20 | Ht 79.0 in | Wt 342.0 lb

## 2019-10-12 DIAGNOSIS — E119 Type 2 diabetes mellitus without complications: Secondary | ICD-10-CM

## 2019-10-12 DIAGNOSIS — E1159 Type 2 diabetes mellitus with other circulatory complications: Secondary | ICD-10-CM

## 2019-10-12 DIAGNOSIS — I10 Essential (primary) hypertension: Secondary | ICD-10-CM

## 2019-10-12 LAB — BAYER DCA HB A1C WAIVED: HB A1C (BAYER DCA - WAIVED): 7.9 % — ABNORMAL HIGH (ref <7.0)

## 2019-10-12 MED ORDER — FREESTYLE LIBRE 14 DAY SENSOR MISC: 1.0000 "application " | 11 refills | Status: DC

## 2019-10-12 MED ORDER — FREESTYLE LIBRE READER DEVI: 1.0000 | Freq: Three times a day (TID) | 0 refills | Status: DC

## 2019-10-12 MED ORDER — CANAGLIFLOZIN 100 MG PO TABS: 100.0000 mg | ORAL_TABLET | Freq: Every day | ORAL | 3 refills | Status: DC

## 2019-10-12 NOTE — Patient Instructions (Signed)

## 2019-10-12 NOTE — Progress Notes (Signed)
Subjective:  Patient ID: Derek Decker, male    DOB: 01/15/1986, 33 y.o.   MRN: [id number]  Patient Care Team: Baruch Gouty, FNP as PCP - General (Family Medicine)   Chief Complaint:  Medical Management of Chronic Issues (3 mo ), Diabetes, and Hypertension   HPI: Derek Decker is a 33 y.o. male presenting on 10/12/2019 for Medical Management of Chronic Issues (3 mo ), Diabetes, and Hypertension   1. Hypertension associated with type 2 diabetes mellitus ([place]) Complaint with meds - Yes Current Medications - Zestoretic Checking BP at home - No Exercising Regularly - Yes Watching Salt intake - Yes Pertinent ROS:  Headache - No Fatigue - No Visual Disturbances - No Chest pain - No Dyspnea - No Palpitations - No LE edema - No They report good compliance with medications and can restate their regimen by memory. No medication side effects.  Family, social, and smoking history reviewed.   BP Readings from Last 3 Encounters:  10/12/19 133/86  07/10/19 135/90  10/02/18 (!) 164/92   CMP Latest Ref Rng & Units 07/10/2019 10/01/2018  Glucose 65 - 99 mg/dL 122(H) 204(H)  BUN 6 - 20 mg/dL 10 9  Creatinine 0.76 - 1.27 mg/dL 0.92 0.87  Sodium 134 - 144 mmol/L 141 138  Potassium 3.5 - 5.2 mmol/L 4.6 3.7  Chloride 96 - 106 mmol/L 102 104  CO2 20 - 29 mmol/L 20 24  Calcium 8.7 - 10.2 mg/dL 9.7 8.9  Total Protein 6.0 - 8.5 g/dL 7.1 7.2  Total Bilirubin 0.0 - 1.2 mg/dL 0.5 1.5(H)  Alkaline Phos 39 - 117 IU/L 60 51  AST 0 - 40 IU/L 44(H) 22  ALT 0 - 44 IU/L 68(H) 35      2. Type 2 diabetes mellitus without complication, without long-term current use of insulin (HCC) Pt presents for follow up evaluation of Type 2 diabetes mellitus.  Current symptoms include none. Patient denies foot ulcerations, hyperglycemia, hypoglycemia , increased appetite, nausea, paresthesia of the feet, polydipsia, polyuria, visual disturbances, vomiting and weight loss.  Current diabetic medications  include metformin Compliant with meds - Yes, having diarrhea every other day with metformin.  Current monitoring regimen: home blood tests - 2-3 times daily Home blood sugar records: trend: fluctuating a lot Any episodes of hypoglycemia? no  Known diabetic complications: cardiovascular disease Cardiovascular risk factors: diabetes mellitus, dyslipidemia, hypertension, male gender and obesity (BMI >= 30 kg/m2) Eye exam current (within one year): no Podiatry yearly?  No Weight trend: stable Current diet: in general, an "unhealthy" diet Current exercise: walking  PNA Vaccine UTD?  No Hep B Vaccine?  Yes Tdap Vaccine UTD?  Yes Urine microalbumin UTD? Yes  Is He on ACE inhibitor or angiotensin II receptor blocker?  Yes, lisinopril Is He on statin? No , declines Is He on ASA 81 mg daily?  No    3. Morbid obesity ([place]) Pt states his diet has not been great due to the holidays. States he has been walking daily.    Relevant past medical, surgical, family, and social history reviewed and updated as indicated.  Allergies and medications reviewed and updated. Date reviewed: Chart in Epic.   Past Medical History:  Diagnosis Date  . Hypertension     Past Surgical History:  Procedure Laterality Date  . FRACTURE SURGERY Left 2016   left foot    Social History   Socioeconomic History  . Marital status: Single    Spouse name: Not on  file  . Number of children: Not on file  . Years of education: Not on file  . Highest education level: Not on file  Occupational History  . Not on file  Tobacco Use  . Smoking status: Former Smoker    Packs/day: 0.25    Years: 3.00    Pack years: 0.75  . Smokeless tobacco: Never Used  Substance and Sexual Activity  . Alcohol use: No  . Drug use: No  . Sexual activity: Not on file  Other Topics Concern  . Not on file  Social History Narrative  . Not on file   Social Determinants of Health   Financial Resource Strain:   . Difficulty of  Paying Living Expenses: Not on file  Food Insecurity:   . Worried About Charity fundraiser in the Last Year: Not on file  . Ran Out of Food in the Last Year: Not on file  Transportation Needs:   . Lack of Transportation (Medical): Not on file  . Lack of Transportation (Non-Medical): Not on file  Physical Activity:   . Days of Exercise per Week: Not on file  . Minutes of Exercise per Session: Not on file  Stress:   . Feeling of Stress : Not on file  Social Connections:   . Frequency of Communication with Friends and Family: Not on file  . Frequency of Social Gatherings with Friends and Family: Not on file  . Attends Religious Services: Not on file  . Active Member of Clubs or Organizations: Not on file  . Attends Archivist Meetings: Not on file  . Marital Status: Not on file  Intimate Partner Violence:   . Fear of Current or Ex-Partner: Not on file  . Emotionally Abused: Not on file  . Physically Abused: Not on file  . Sexually Abused: Not on file    Outpatient Encounter Medications as of 10/12/2019  Medication Sig  . glucose blood (ONETOUCH VERIO) test strip USE 1 STRIP TO CHECK GLUCOSE ONCE DAILY  . lisinopril-hydrochlorothiazide (ZESTORETIC) 10-12.5 MG tablet Take 1 tablet by mouth daily.  . metFORMIN (GLUCOPHAGE) 500 MG tablet Take 1 tablet (500 mg total) by mouth 2 (two) times daily.  . canagliflozin (INVOKANA) 100 MG TABS tablet Take 1 tablet (100 mg total) by mouth daily before breakfast.  . Continuous Blood Gluc Receiver (FREESTYLE LIBRE READER) DEVI 1 Device by Does not apply route 3 (three) times daily.  . Continuous Blood Gluc Sensor (FREESTYLE LIBRE 14 DAY SENSOR) MISC 1 application by Does not apply route every 14 (fourteen) days.   No facility-administered encounter medications on file as of 10/12/2019.    No Known Allergies  Review of Systems  Constitutional: Negative for activity change, appetite change, chills, diaphoresis, fatigue and fever.    HENT: Negative.   Eyes: Negative.  Negative for photophobia and visual disturbance.  Respiratory: Negative for cough, chest tightness and shortness of breath.   Cardiovascular: Negative for chest pain, palpitations and leg swelling.  Gastrointestinal: Positive for diarrhea. Negative for abdominal distention, abdominal pain, anal bleeding, blood in stool, constipation, nausea, rectal pain and vomiting.  Endocrine: Negative.  Negative for polydipsia, polyphagia and polyuria.  Genitourinary: Negative for decreased urine volume, difficulty urinating, dysuria, frequency and urgency.  Musculoskeletal: Negative for arthralgias and myalgias.  Skin: Negative.  Negative for color change, rash and wound.  Allergic/Immunologic: Negative.   Neurological: Negative for dizziness, tremors, seizures, syncope, facial asymmetry, speech difficulty, weakness, numbness and headaches.  Hematological: Negative.  Psychiatric/Behavioral: Negative for confusion, hallucinations, sleep disturbance and suicidal ideas.  All other systems reviewed and are negative.       Objective:  BP 133/86   Pulse (!) 106   Temp (!) 97.1 F (36.2 C)   Resp 20   Ht '6\' 7"'  (2.007 m)   Wt (!) 342 lb (155.1 kg)   SpO2 98%   BMI 38.53 kg/m    Wt Readings from Last 3 Encounters:  10/12/19 (!) 342 lb (155.1 kg)  07/10/19 (!) 342 lb 12.8 oz (155.5 kg)  10/01/18 (!) 327 lb (148.3 kg)    Physical Exam Vitals and nursing note reviewed.  Constitutional:      General: He is not in acute distress.    Appearance: Normal appearance. He is well-developed and well-groomed. He is morbidly obese. He is not ill-appearing, toxic-appearing or diaphoretic.  HENT:     Head: Normocephalic and atraumatic.     Jaw: There is normal jaw occlusion.     Right Ear: Hearing normal.     Left Ear: Hearing normal.     Nose: Nose normal.     Mouth/Throat:     Lips: Pink.     Mouth: Mucous membranes are moist.     Pharynx: Oropharynx is clear.  Uvula midline.  Eyes:     General: Lids are normal.     Extraocular Movements: Extraocular movements intact.     Conjunctiva/sclera: Conjunctivae normal.     Pupils: Pupils are equal, round, and reactive to light.  Neck:     Thyroid: No thyroid mass, thyromegaly or thyroid tenderness.     Vascular: No carotid bruit or JVD.     Trachea: Trachea and phonation normal.  Cardiovascular:     Rate and Rhythm: Normal rate and regular rhythm.     Chest Wall: PMI is not displaced.     Pulses: Normal pulses.     Heart sounds: Normal heart sounds. No murmur. No friction rub. No gallop.   Pulmonary:     Effort: Pulmonary effort is normal. No respiratory distress.     Breath sounds: Normal breath sounds. No wheezing.  Abdominal:     General: Bowel sounds are normal. There is no distension or abdominal bruit.     Palpations: Abdomen is soft. There is no hepatomegaly or splenomegaly.     Tenderness: There is no abdominal tenderness. There is no right CVA tenderness or left CVA tenderness.     Hernia: No hernia is present.  Musculoskeletal:        General: Normal range of motion.     Cervical back: Normal range of motion and neck supple.     Right lower leg: No edema.     Left lower leg: No edema.  Lymphadenopathy:     Cervical: No cervical adenopathy.  Skin:    General: Skin is warm and dry.     Capillary Refill: Capillary refill takes less than 2 seconds.     Coloration: Skin is not cyanotic, jaundiced or pale.     Findings: No rash.  Neurological:     General: No focal deficit present.     Mental Status: He is alert and oriented to person, place, and time.     Cranial Nerves: Cranial nerves are intact.     Sensory: Sensation is intact.     Motor: Motor function is intact.     Coordination: Coordination is intact.     Gait: Gait is intact.     Deep  Tendon Reflexes: Reflexes are normal and symmetric.  Psychiatric:        Attention and Perception: Attention and perception normal.         Mood and Affect: Mood and affect normal.        Speech: Speech normal.        Behavior: Behavior normal. Behavior is cooperative.        Thought Content: Thought content normal.        Cognition and Memory: Cognition and memory normal.        Judgment: Judgment normal.     Results for orders placed or performed in visit on 07/10/19  CMP14+EGFR  Result Value Ref Range   Glucose 122 (H) 65 - 99 mg/dL   BUN 10 6 - 20 mg/dL   Creatinine, Ser 0.92 0.76 - 1.27 mg/dL   GFR calc non Af Amer 108 >59 mL/min/1.73   GFR calc Af Amer 125 >59 mL/min/1.73   BUN/Creatinine Ratio 11 9 - 20   Sodium 141 134 - 144 mmol/L   Potassium 4.6 3.5 - 5.2 mmol/L   Chloride 102 96 - 106 mmol/L   CO2 20 20 - 29 mmol/L   Calcium 9.7 8.7 - 10.2 mg/dL   Total Protein 7.1 6.0 - 8.5 g/dL   Albumin 4.5 4.0 - 5.0 g/dL   Globulin, Total 2.6 1.5 - 4.5 g/dL   Albumin/Globulin Ratio 1.7 1.2 - 2.2   Bilirubin Total 0.5 0.0 - 1.2 mg/dL   Alkaline Phosphatase 60 39 - 117 IU/L   AST 44 (H) 0 - 40 IU/L   ALT 68 (H) 0 - 44 IU/L  CBC with Differential/Platelet  Result Value Ref Range   WBC 8.4 3.4 - 10.8 x10E3/uL   RBC 5.11 4.14 - 5.80 x10E6/uL   Hemoglobin 15.0 13.0 - 17.7 g/dL   Hematocrit 44.6 37.5 - 51.0 %   MCV 87 79 - 97 fL   MCH 29.4 26.6 - 33.0 pg   MCHC 33.6 31.5 - 35.7 g/dL   RDW 11.7 11.6 - 15.4 %   Platelets 333 150 - 450 x10E3/uL   Neutrophils 55 Not Estab. %   Lymphs 33 Not Estab. %   Monocytes 5 Not Estab. %   Eos 5 Not Estab. %   Basos 1 Not Estab. %   Neutrophils Absolute 4.6 1.4 - 7.0 x10E3/uL   Lymphocytes Absolute 2.8 0.7 - 3.1 x10E3/uL   Monocytes Absolute 0.4 0.1 - 0.9 x10E3/uL   EOS (ABSOLUTE) 0.4 0.0 - 0.4 x10E3/uL   Basophils Absolute 0.1 0.0 - 0.2 x10E3/uL   Immature Granulocytes 1 Not Estab. %   Immature Grans (Abs) 0.1 0.0 - 0.1 x10E3/uL  Lipid panel  Result Value Ref Range   Cholesterol, Total 161 100 - 199 mg/dL   Triglycerides 128 0 - 149 mg/dL   HDL 49 >39 mg/dL   VLDL  Cholesterol Cal 23 5 - 40 mg/dL   LDL Chol Calc (NIH) 89 0 - 99 mg/dL   Chol/HDL Ratio 3.3 0.0 - 5.0 ratio  Thyroid Panel With TSH  Result Value Ref Range   TSH 0.860 0.450 - 4.500 uIU/mL   T4, Total 8.9 4.5 - 12.0 ug/dL   T3 Uptake Ratio 27 24 - 39 %   Free Thyroxine Index 2.4 1.2 - 4.9  Microalbumin / creatinine urine ratio  Result Value Ref Range   Creatinine, Urine 150.9 Not Estab. mg/dL   Microalbumin, Urine 173.2 Not Estab. ug/mL   Microalb/Creat Ratio 115 (  H) 0 - 29 mg/g creat  Bayer DCA Hb A1c Waived  Result Value Ref Range   HB A1C (BAYER DCA - WAIVED) 6.7 <7.0 %       Pertinent labs & imaging results that were available during my care of the patient were reviewed by me and considered in my medical decision making.  Assessment & Plan:  Eliott was seen today for medical management of chronic issues, diabetes and hypertension.  Diagnoses and all orders for this visit:  Hypertension associated with type 2 diabetes mellitus ([place]) BP well controlled. Changes were not made in regimen today. Goal BP is 130/80. Pt aware to report any persistent high or low readings. DASH diet and exercise encouraged. Exercise at least 150 minutes per week and increase as tolerated. Goal BMI > 25. Stress management encouraged. Avoid nicotine and tobacco product use. Avoid excessive alcohol and NSAID's. Avoid more than 2000 mg of sodium daily. Medications as prescribed. Follow up as scheduled.  -     CBC with Differential/Platelet -     CMP14+EGFR  Type 2 diabetes mellitus without complication, without long-term current use of insulin (HCC) Lab Results  Component Value Date   HGBA1C 6.7 07/10/2019    Diabetes Control: poor, A1V 7.9 today Instruction/counseling given: reminded to get eye exam, reminded to bring blood glucose meter & log to each visit, reminded to bring medications to each visit, discussed foot care, discussed the need for weight loss, discussed diet and provided printed educational  material   1.  Rx changes: Added invoakna, did not increase metformin due to intolerance to higher doses 2.  Education: Reviewed 'ABCs' of diabetes management (respective goals in parentheses):  A1C (<7), blood pressure (<130/80), BMI (<25), and cholesterol (LDL <100). 3.  Discussed pathophysiology of DM; difference between type 1 and type 2 DM. 4.  CHO counting diet discussed.  Reviewed CHO amount in various foods and how to read nutrition labels.  Discussed recommended serving sizes.  5.  Recommend check BG several times a week and record readings to bring to next appointment. Report persistent high or low readings. 6.  Recommended increase physical activity - goal is 150 minutes per week and advance as tolerated. 7.  Adequate sleep, at least 6-8 hours per night.  8.  Smoking cessation.  9.  Follow up: 3 months   -     CBC with Differential/Platelet -     Bayer DCA Hb A1c Waived -     Continuous Blood Gluc Receiver (FREESTYLE LIBRE READER) DEVI; 1 Device by Does not apply route 3 (three) times daily. -     Continuous Blood Gluc Sensor (FREESTYLE LIBRE 14 DAY SENSOR) MISC; 1 application by Does not apply route every 14 (fourteen) days. -     canagliflozin (INVOKANA) 100 MG TABS tablet; Take 1 tablet (100 mg total) by mouth daily before breakfast.  Morbid obesity ([place]) Diet and exercise encouraged. Will initiate below due to elevated A1C, will help with weight loss.  -     canagliflozin (INVOKANA) 100 MG TABS tablet; Take 1 tablet (100 mg total) by mouth daily before breakfast.     Continue all other maintenance medications.  Follow up plan: Return in about 3 months (around 01/10/2020), or if symptoms worsen or fail to improve, for DM.  Continue healthy lifestyle choices, including diet (rich in fruits, vegetables, and lean proteins, and low in salt and simple carbohydrates) and exercise (at least 30 minutes of moderate physical activity daily).  Educational  handout given for DM  The  above assessment and management plan was discussed with the patient. The patient verbalized understanding of and has agreed to the management plan. Patient is aware to call the clinic if they develop any new symptoms or if symptoms persist or worsen. Patient is aware when to return to the clinic for a follow-up visit. Patient educated on when it is appropriate to go to the emergency department.   Monia Pouch, FNP-C [facility] [phone]

## 2019-10-13 LAB — CMP14+EGFR
ALT: 82 IU/L — ABNORMAL HIGH (ref 0–44)
AST: 61 IU/L — ABNORMAL HIGH (ref 0–40)
Albumin/Globulin Ratio: 2.2 (ref 1.2–2.2)
Albumin: 4.8 g/dL (ref 4.0–5.0)
Alkaline Phosphatase: 65 IU/L (ref 39–117)
BUN/Creatinine Ratio: 10 (ref 9–20)
BUN: 11 mg/dL (ref 6–20)
Bilirubin Total: 0.7 mg/dL (ref 0.0–1.2)
CO2: 24 mmol/L (ref 20–29)
Calcium: 10 mg/dL (ref 8.7–10.2)
Chloride: 96 mmol/L (ref 96–106)
Creatinine, Ser: 1.13 mg/dL (ref 0.76–1.27)
GFR calc Af Amer: 97 mL/min/{1.73_m2} (ref >59)
GFR calc non Af Amer: 84 mL/min/{1.73_m2} (ref >59)
Globulin, Total: 2.2 g/dL (ref 1.5–4.5)
Glucose: 234 mg/dL — ABNORMAL HIGH (ref 65–99)
Potassium: 4.9 mmol/L (ref 3.5–5.2)
Sodium: 138 mmol/L (ref 134–144)
Total Protein: 7 g/dL (ref 6.0–8.5)

## 2019-10-13 LAB — CBC WITH DIFFERENTIAL/PLATELET
Basophils Absolute: 0.1 10*3/uL (ref 0.0–0.2)
Basos: 1 %
EOS (ABSOLUTE): 0.3 10*3/uL (ref 0.0–0.4)
Eos: 3 %
Hematocrit: 45.5 % (ref 37.5–51.0)
Hemoglobin: 15.3 g/dL (ref 13.0–17.7)
Immature Grans (Abs): 0.1 10*3/uL (ref 0.0–0.1)
Immature Granulocytes: 1 %
Lymphocytes Absolute: 3 10*3/uL (ref 0.7–3.1)
Lymphs: 27 %
MCH: 29.8 pg (ref 26.6–33.0)
MCHC: 33.6 g/dL (ref 31.5–35.7)
MCV: 89 fL (ref 79–97)
Monocytes Absolute: 0.7 10*3/uL (ref 0.1–0.9)
Monocytes: 6 %
Neutrophils Absolute: 6.9 10*3/uL (ref 1.4–7.0)
Neutrophils: 62 %
Platelets: 318 10*3/uL (ref 150–450)
RBC: 5.14 x10E6/uL (ref 4.14–5.80)
RDW: 11.8 % (ref 11.6–15.4)
WBC: 11 10*3/uL — ABNORMAL HIGH (ref 3.4–10.8)

## 2019-10-14 NOTE — Progress Notes (Signed)
Pt was giving his labs

## 2019-11-03 ENCOUNTER — Other Ambulatory Visit: Payer: Self-pay | Admitting: *Deleted

## 2019-11-03 DIAGNOSIS — E119 Type 2 diabetes mellitus without complications: Secondary | ICD-10-CM

## 2019-11-03 DIAGNOSIS — I152 Hypertension secondary to endocrine disorders: Secondary | ICD-10-CM

## 2019-11-03 DIAGNOSIS — E1159 Type 2 diabetes mellitus with other circulatory complications: Secondary | ICD-10-CM

## 2019-11-03 MED ORDER — METFORMIN HCL 500 MG PO TABS: 500.0000 mg | ORAL_TABLET | Freq: Two times a day (BID) | ORAL | 0 refills | Status: DC

## 2019-11-03 MED ORDER — LISINOPRIL-HYDROCHLOROTHIAZIDE 10-12.5 MG PO TABS: 1.0000 | ORAL_TABLET | Freq: Every day | ORAL | 0 refills | Status: DC

## 2019-12-07 ENCOUNTER — Other Ambulatory Visit: Payer: Self-pay | Admitting: *Deleted

## 2019-12-07 DIAGNOSIS — E119 Type 2 diabetes mellitus without complications: Secondary | ICD-10-CM

## 2019-12-08 ENCOUNTER — Other Ambulatory Visit: Payer: Self-pay | Admitting: Family Medicine

## 2019-12-08 DIAGNOSIS — E119 Type 2 diabetes mellitus without complications: Secondary | ICD-10-CM

## 2020-01-13 ENCOUNTER — Ambulatory Visit (INDEPENDENT_AMBULATORY_CARE_PROVIDER_SITE_OTHER): Payer: 59 | Admitting: Family Medicine

## 2020-01-13 ENCOUNTER — Encounter: Payer: Self-pay | Admitting: Family Medicine

## 2020-01-13 ENCOUNTER — Other Ambulatory Visit: Payer: Self-pay

## 2020-01-13 VITALS — BP 136/84 | HR 120 | Temp 105.0°F | Ht 79.0 in | Wt 337.6 lb

## 2020-01-13 DIAGNOSIS — E1159 Type 2 diabetes mellitus with other circulatory complications: Secondary | ICD-10-CM | POA: Diagnosis not present

## 2020-01-13 DIAGNOSIS — Z23 Encounter for immunization: Secondary | ICD-10-CM | POA: Diagnosis not present

## 2020-01-13 DIAGNOSIS — E119 Type 2 diabetes mellitus without complications: Secondary | ICD-10-CM

## 2020-01-13 DIAGNOSIS — I10 Essential (primary) hypertension: Secondary | ICD-10-CM

## 2020-01-13 LAB — BAYER DCA HB A1C WAIVED: HB A1C (BAYER DCA - WAIVED): 7.8 % — ABNORMAL HIGH (ref <7.0)

## 2020-01-13 MED ORDER — METFORMIN HCL 500 MG PO TABS: 500.0000 mg | ORAL_TABLET | Freq: Every day | ORAL | 2 refills | Status: DC

## 2020-01-13 MED ORDER — CANAGLIFLOZIN 300 MG PO TABS: 300.0000 mg | ORAL_TABLET | Freq: Every day | ORAL | 2 refills | Status: DC

## 2020-01-13 MED ORDER — FREESTYLE LIBRE 14 DAY SENSOR MISC: 1.0000 "application " | 11 refills | Status: DC

## 2020-01-13 MED ORDER — LISINOPRIL-HYDROCHLOROTHIAZIDE 10-12.5 MG PO TABS: 1.0000 | ORAL_TABLET | Freq: Every day | ORAL | 2 refills | Status: DC

## 2020-01-13 NOTE — Patient Instructions (Signed)

## 2020-01-13 NOTE — Progress Notes (Signed)
Subjective:  Patient ID: Derek Decker, male    DOB: September 28, 1986, 34 y.o.   MRN: [id number]  Patient Care Team: Sharion Balloon, FNP as PCP - General (Family Medicine)   Chief Complaint:  Medical Management of Chronic Issues, Hypertension, and Diabetes   HPI: Derek Decker is a 34 y.o. male presenting on 01/13/2020 for Medical Management of Chronic Issues, Hypertension, and Diabetes  1. Hypertension associated with type 2 diabetes mellitus ([place]) Pt has been taking medications as prescribed and tolerating well. No chest pain, shortness of breath, leg swelling, headaches, weakness, or confusion.   2. Type 2 diabetes mellitus without complication, without long-term current use of insulin ([place]) Has been doing ok with diet but would like more information about diet. States the metformin is causing diarrhea daily and he is unable to tolerate it. States he has not adjusted to the diarrhea. He denies polyuria, polydipsia, or polyphagia.   3. Morbid obesity ([place]) Has tried to change diet. Does not exercise on a regular basis.      Relevant past medical, surgical, family, and social history reviewed and updated as indicated.  Allergies and medications reviewed and updated. Date reviewed: Chart in Epic.   Past Medical History:  Diagnosis Date  . Hypertension     Past Surgical History:  Procedure Laterality Date  . FRACTURE SURGERY Left 2016   left foot    Social History   Socioeconomic History  . Marital status: Single    Spouse name: Not on file  . Number of children: Not on file  . Years of education: Not on file  . Highest education level: Not on file  Occupational History  . Not on file  Tobacco Use  . Smoking status: Former Smoker    Packs/day: 0.25    Years: 3.00    Pack years: 0.75  . Smokeless tobacco: Never Used  Substance and Sexual Activity  . Alcohol use: No  . Drug use: No  . Sexual activity: Not on file  Other Topics Concern  . Not on file  Social  History Narrative  . Not on file   Social Determinants of Health   Financial Resource Strain:   . Difficulty of Paying Living Expenses:   Food Insecurity:   . Worried About Charity fundraiser in the Last Year:   . Arboriculturist in the Last Year:   Transportation Needs:   . Film/video editor (Medical):   Marland Kitchen Lack of Transportation (Non-Medical):   Physical Activity:   . Days of Exercise per Week:   . Minutes of Exercise per Session:   Stress:   . Feeling of Stress :   Social Connections:   . Frequency of Communication with Friends and Family:   . Frequency of Social Gatherings with Friends and Family:   . Attends Religious Services:   . Active Member of Clubs or Organizations:   . Attends Archivist Meetings:   Marland Kitchen Marital Status:   Intimate Partner Violence:   . Fear of Current or Ex-Partner:   . Emotionally Abused:   Marland Kitchen Physically Abused:   . Sexually Abused:     Outpatient Encounter Medications as of 01/13/2020  Medication Sig  . canagliflozin (INVOKANA) 300 MG TABS tablet Take 1 tablet (300 mg total) by mouth daily before breakfast.  . Continuous Blood Gluc Receiver (FREESTYLE LIBRE 14 DAY READER) DEVI 1 Device by Other route 3 (three) times daily. Dx E11.9  .  Continuous Blood Gluc Sensor (FREESTYLE LIBRE 14 DAY SENSOR) MISC 1 application by Does not apply route every 14 (fourteen) days.  Marland Kitchen glucose blood (ONETOUCH VERIO) test strip USE 1 STRIP TO CHECK GLUCOSE ONCE DAILY  . lisinopril-hydrochlorothiazide (ZESTORETIC) 10-12.5 MG tablet Take 1 tablet by mouth daily.  . metFORMIN (GLUCOPHAGE) 500 MG tablet Take 1 tablet (500 mg total) by mouth daily with breakfast.  . [DISCONTINUED] canagliflozin (INVOKANA) 100 MG TABS tablet Take 1 tablet (100 mg total) by mouth daily before breakfast.  . [DISCONTINUED] Continuous Blood Gluc Sensor (FREESTYLE LIBRE 14 DAY SENSOR) MISC 1 application by Does not apply route every 14 (fourteen) days.  . [DISCONTINUED]  lisinopril-hydrochlorothiazide (ZESTORETIC) 10-12.5 MG tablet Take 1 tablet by mouth daily.  . [DISCONTINUED] metFORMIN (GLUCOPHAGE) 500 MG tablet Take 1 tablet (500 mg total) by mouth 2 (two) times daily.   No facility-administered encounter medications on file as of 01/13/2020.    No Known Allergies  Review of Systems  Constitutional: Negative for activity change, appetite change, chills, diaphoresis, fatigue, fever and unexpected weight change.  HENT: Negative.   Eyes: Negative.  Negative for photophobia and visual disturbance.  Respiratory: Negative for cough, chest tightness and shortness of breath.   Cardiovascular: Negative for chest pain, palpitations and leg swelling.  Gastrointestinal: Positive for diarrhea. Negative for abdominal pain, blood in stool, constipation, nausea and vomiting.  Endocrine: Negative.  Negative for cold intolerance, heat intolerance, polydipsia, polyphagia and polyuria.  Genitourinary: Negative for decreased urine volume, difficulty urinating, dysuria, frequency and urgency.  Musculoskeletal: Negative for arthralgias and myalgias.  Skin: Negative.   Allergic/Immunologic: Negative.   Neurological: Negative for dizziness, tremors, seizures, syncope, facial asymmetry, speech difficulty, weakness, light-headedness, numbness and headaches.  Hematological: Negative.   Psychiatric/Behavioral: Negative for confusion, hallucinations, sleep disturbance and suicidal ideas.  All other systems reviewed and are negative.       Objective:  BP 136/84   Pulse (!) 120   Temp (!) 105 F (40.6 C)   Ht _0  (2.007 m)   Wt (!) 337 lb 9.6 oz (153.1 kg)   SpO2 98%   BMI 38.03 kg/m    Wt Readings from Last 3 Encounters:  01/13/20 (!) 337 lb 9.6 oz (153.1 kg)  10/12/19 (!) 342 lb (155.1 kg)  07/10/19 (!) 342 lb 12.8 oz (155.5 kg)    Physical Exam Vitals and nursing note reviewed.  Constitutional:      General: He is not in acute distress.    Appearance: Normal  appearance. He is well-developed and well-groomed. He is morbidly obese. He is not ill-appearing, toxic-appearing or diaphoretic.  HENT:     Head: Normocephalic and atraumatic.     Jaw: There is normal jaw occlusion.     Right Ear: Hearing normal.     Left Ear: Hearing normal.     Nose: Nose normal.     Mouth/Throat:     Lips: Pink.     Mouth: Mucous membranes are moist.     Pharynx: Oropharynx is clear. Uvula midline.  Eyes:     General: Lids are normal.     Extraocular Movements: Extraocular movements intact.     Conjunctiva/sclera: Conjunctivae normal.     Pupils: Pupils are equal, round, and reactive to light.  Neck:     Thyroid: No thyroid mass, thyromegaly or thyroid tenderness.     Vascular: No carotid bruit or JVD.     Trachea: Trachea and phonation normal.  Cardiovascular:     Rate  and Rhythm: Normal rate and regular rhythm.     Chest Wall: PMI is not displaced.     Pulses: Normal pulses.     Heart sounds: Normal heart sounds. No murmur. No friction rub. No gallop.   Pulmonary:     Effort: Pulmonary effort is normal. No respiratory distress.     Breath sounds: Normal breath sounds. No wheezing.  Abdominal:     General: Bowel sounds are normal. There is no distension or abdominal bruit.     Palpations: Abdomen is soft. There is no hepatomegaly or splenomegaly.     Tenderness: There is no abdominal tenderness. There is no right CVA tenderness or left CVA tenderness.     Hernia: No hernia is present.  Musculoskeletal:        General: Normal range of motion.     Cervical back: Normal range of motion and neck supple.     Right lower leg: No edema.     Left lower leg: No edema.  Lymphadenopathy:     Cervical: No cervical adenopathy.  Skin:    General: Skin is warm and dry.     Capillary Refill: Capillary refill takes less than 2 seconds.     Coloration: Skin is not cyanotic, jaundiced or pale.     Findings: No rash.  Neurological:     General: No focal deficit  present.     Mental Status: He is alert and oriented to person, place, and time.     Cranial Nerves: Cranial nerves are intact. No cranial nerve deficit.     Sensory: Sensation is intact. No sensory deficit.     Motor: Motor function is intact. No weakness.     Coordination: Coordination is intact. Coordination normal.     Gait: Gait is intact. Gait normal.     Deep Tendon Reflexes: Reflexes are normal and symmetric. Reflexes normal.  Psychiatric:        Attention and Perception: Attention and perception normal.        Mood and Affect: Mood and affect normal.        Speech: Speech normal.        Behavior: Behavior normal. Behavior is cooperative.        Thought Content: Thought content normal.        Cognition and Memory: Cognition and memory normal.        Judgment: Judgment normal.     Results for orders placed or performed in visit on 01/13/20  Bayer DCA Hb A1c Waived  Result Value Ref Range   HB A1C (BAYER DCA - WAIVED) 7.8 (H) <7.0 %       Pertinent labs & imaging results that were available during my care of the patient were reviewed by me and considered in my medical decision making.  Assessment & Plan:  Lakyn was seen today for medical management of chronic issues, hypertension and diabetes.  Diagnoses and all orders for this visit:  Hypertension associated with type 2 diabetes mellitus ([place]) BP well controlled. Changes were not made in regimen today. Goal BP is 130/80. Pt aware to report any persistent high or low readings. DASH diet and exercise encouraged. Exercise at least 150 minutes per week and increase as tolerated. Goal BMI > 25. Stress management encouraged. Avoid nicotine and tobacco product use. Avoid excessive alcohol and NSAID's. Avoid more than 2000 mg of sodium daily. Medications as prescribed. Follow up as scheduled.  -     CMP14+EGFR -  CBC with Differential/Platelet -     lisinopril-hydrochlorothiazide (ZESTORETIC) 10-12.5 MG tablet; Take 1 tablet by  mouth daily.  Type 2 diabetes mellitus without complication, without long-term current use of insulin (H[facility]) A1C 7.8 today. Pt unable to tolerate metformin 500 mg twice daily. Will decrease to once daily and increase invokana to 300 mg daily. Diet and exercise discussed in detail. PNA vaccine updated today. Pt scheduled to have eye exam next month with Dr. Marin Comment. Will get records.  -     CMP14+EGFR -     CBC with Differential/Platelet -     Cancel: HgB A1c -     Bayer DCA Hb A1c Waived -     Continuous Blood Gluc Sensor (FREESTYLE LIBRE 14 DAY SENSOR) MISC; 1 application by Does not apply route every 14 (fourteen) days. -     metFORMIN (GLUCOPHAGE) 500 MG tablet; Take 1 tablet (500 mg total) by mouth daily with breakfast. -     canagliflozin (INVOKANA) 300 MG TABS tablet; Take 1 tablet (300 mg total) by mouth daily before breakfast. -     Pneumococcal polysaccharide vaccine 23-valent greater than or equal to 2yo subcutaneous/IM  Morbid obesity (HCC) Diet and exercise encouraged.  -     canagliflozin (INVOKANA) 300 MG TABS tablet; Take 1 tablet (300 mg total) by mouth daily before breakfast.     Continue all other maintenance medications.  Follow up plan: Return in about 3 months (around 04/13/2020), or if symptoms worsen or fail to improve, for DM.    Continue healthy lifestyle choices, including diet (rich in fruits, vegetables, and lean proteins, and low in salt and simple carbohydrates) and exercise (at least 30 minutes of moderate physical activity daily).  Educational handout given for DM, diabetic diet, diabetic medications   The above assessment and management plan was discussed with the patient. The patient verbalized understanding of and has agreed to the management plan. Patient is aware to call the clinic if they develop any new symptoms or if symptoms persist or worsen. Patient is aware when to return to the clinic for a follow-up visit. Patient educated on when it is appropriate  to go to the emergency department.   Monia Pouch, FNP-C [facility] [phone]

## 2020-01-14 LAB — CMP14+EGFR
ALT: 64 IU/L — ABNORMAL HIGH (ref 0–44)
AST: 42 IU/L — ABNORMAL HIGH (ref 0–40)
Albumin/Globulin Ratio: 1.8 (ref 1.2–2.2)
Albumin: 4.4 g/dL (ref 4.0–5.0)
Alkaline Phosphatase: 62 IU/L (ref 39–117)
BUN/Creatinine Ratio: 11 (ref 9–20)
BUN: 11 mg/dL (ref 6–20)
Bilirubin Total: 0.5 mg/dL (ref 0.0–1.2)
CO2: 21 mmol/L (ref 20–29)
Calcium: 9.8 mg/dL (ref 8.7–10.2)
Chloride: 99 mmol/L (ref 96–106)
Creatinine, Ser: 1.01 mg/dL (ref 0.76–1.27)
GFR calc Af Amer: 112 mL/min/{1.73_m2} (ref >59)
GFR calc non Af Amer: 97 mL/min/{1.73_m2} (ref >59)
Globulin, Total: 2.4 g/dL (ref 1.5–4.5)
Glucose: 173 mg/dL — ABNORMAL HIGH (ref 65–99)
Potassium: 4.5 mmol/L (ref 3.5–5.2)
Sodium: 139 mmol/L (ref 134–144)
Total Protein: 6.8 g/dL (ref 6.0–8.5)

## 2020-01-14 LAB — CBC WITH DIFFERENTIAL/PLATELET
Basophils Absolute: 0.1 10*3/uL (ref 0.0–0.2)
Basos: 1 %
EOS (ABSOLUTE): 0.3 10*3/uL (ref 0.0–0.4)
Eos: 3 %
Hematocrit: 45.2 % (ref 37.5–51.0)
Hemoglobin: 15.1 g/dL (ref 13.0–17.7)
Immature Grans (Abs): 0 10*3/uL (ref 0.0–0.1)
Immature Granulocytes: 0 %
Lymphocytes Absolute: 2.7 10*3/uL (ref 0.7–3.1)
Lymphs: 28 %
MCH: 30.1 pg (ref 26.6–33.0)
MCHC: 33.4 g/dL (ref 31.5–35.7)
MCV: 90 fL (ref 79–97)
Monocytes Absolute: 0.6 10*3/uL (ref 0.1–0.9)
Monocytes: 6 %
Neutrophils Absolute: 6 10*3/uL (ref 1.4–7.0)
Neutrophils: 62 %
Platelets: 286 10*3/uL (ref 150–450)
RBC: 5.02 x10E6/uL (ref 4.14–5.80)
RDW: 11.8 % (ref 11.6–15.4)
WBC: 9.7 10*3/uL (ref 3.4–10.8)

## 2020-01-25 ENCOUNTER — Ambulatory Visit (INDEPENDENT_AMBULATORY_CARE_PROVIDER_SITE_OTHER)
Admission: RE | Admit: 2020-01-25 | Discharge: 2020-01-25 | Disposition: A | Discharge status: Home / Self Care | Payer: Self-pay | Source: Ambulatory Visit

## 2020-01-25 DIAGNOSIS — L0291 Cutaneous abscess, unspecified: Secondary | ICD-10-CM

## 2020-01-25 MED ORDER — CIPROFLOXACIN HCL 500 MG PO TABS: 500.0000 mg | ORAL_TABLET | Freq: Two times a day (BID) | ORAL | 0 refills | Status: AC

## 2020-01-25 MED ORDER — METRONIDAZOLE 500 MG PO TABS: 500.0000 mg | ORAL_TABLET | Freq: Two times a day (BID) | ORAL | 0 refills | Status: DC

## 2020-01-25 NOTE — ED Provider Notes (Addendum)
Virtual Visit via Video Note:  Derek Decker  initiated request for Telemedicine visit with [facility] team. I connected with Derek Decker  on 01/25/2020 at 11:09 AM  for a synchronized telemedicine visit using a video enabled HIPPA compliant telemedicine application. I verified that I am speaking with Derek Decker  using two identifiers. Janace Aris, NP  was physically located in a [facility] site and Derek Decker was located at a different location.   The limitations of evaluation and management by telemedicine as well as the availability of in-person appointments were discussed. Patient was informed that he  may incur a bill ( including co-pay) for this virtual visit encounter. Derek Decker  expressed understanding and gave verbal consent to proceed with virtual visit.     History of Present Illness:Derek Decker  is a 34 y.o. male presents with perirectal abscess.  This has been present and worsening over the past couple days.  Denies any drainage from the area.  History of similar in the past a few years back.  Denies any fever, chills, body aches or night sweats.  Denies any nausea or vomiting.  Past Medical History:  Diagnosis Date  . Hypertension     No Known Allergies      Observations/Objective:VITALS: Per patient if applicable, see vitals. GENERAL: Alert, appears well and in no acute distress. HEENT: Atraumatic, conjunctiva clear, no obvious abnormalities on inspection of external nose and ears. NECK: Normal movements of the head and neck. CARDIOPULMONARY: No increased WOB. Speaking in clear sentences. I:E ratio WNL.  MS: Moves all visible extremities without noticeable abnormality. PSYCH: Pleasant and cooperative, well-groomed. Speech normal rate and rhythm. Affect is appropriate. Insight and judgement are appropriate. Attention is focused, linear, and appropriate.  NEURO: CN grossly intact. Oriented as arrived to appointment on time with no  prompting. Moves both UE equally.  SKIN: No obvious lesions, wounds, erythema, or cyanosis noted on face or hands.    Assessment and Plan: Based on history and symptoms we will go ahead and treat for possible recurrence of perirectal abscess.  Treating with Cipro and Flagyl. Warm compresses, hot showers and baths Tylenol for pain or ibuprofen as needed   Follow Up Instructions: Recommended if symptoms continue or worsen to include more severe pain, swelling with indication for incision and drainage he will need to go to the ER. Patient understanding and agree.    I discussed the assessment and treatment plan with the patient. The patient was provided an opportunity to ask questions and all were answered. The patient agreed with the plan and demonstrated an understanding of the instructions.   The patient was advised to call back or seek an in-person evaluation if the symptoms worsen or if the condition fails to improve as anticipated.      Janace Aris, NP  01/25/2020 11:09 AM         Janace Aris, NP 01/26/20 1559    Janace Aris, NP 01/26/20 1600

## 2020-01-25 NOTE — Discharge Instructions (Signed)
Go to the ER for any continued or worsening problems. Take the antibiotics as prescribed. Warm compresses

## 2020-01-26 ENCOUNTER — Other Ambulatory Visit: Payer: Self-pay | Admitting: Family Medicine

## 2020-01-26 DIAGNOSIS — E119 Type 2 diabetes mellitus without complications: Secondary | ICD-10-CM

## 2020-02-29 ENCOUNTER — Other Ambulatory Visit: Payer: Self-pay | Admitting: *Deleted

## 2020-02-29 DIAGNOSIS — E119 Type 2 diabetes mellitus without complications: Secondary | ICD-10-CM

## 2020-02-29 MED ORDER — FREESTYLE LIBRE 14 DAY SENSOR MISC: 1.0000 "application " | 2 refills | Status: DC

## 2020-02-29 MED ORDER — FREESTYLE LIBRE 14 DAY READER DEVI: 1.0000 | Freq: Three times a day (TID) | 2 refills | Status: DC

## 2020-03-28 ENCOUNTER — Other Ambulatory Visit: Payer: Self-pay | Admitting: *Deleted

## 2020-03-28 DIAGNOSIS — E119 Type 2 diabetes mellitus without complications: Secondary | ICD-10-CM

## 2020-03-28 DIAGNOSIS — I152 Hypertension secondary to endocrine disorders: Secondary | ICD-10-CM

## 2020-03-28 MED ORDER — METFORMIN HCL 500 MG PO TABS: 500.0000 mg | ORAL_TABLET | Freq: Two times a day (BID) | ORAL | 0 refills | Status: DC

## 2020-03-28 MED ORDER — LISINOPRIL-HYDROCHLOROTHIAZIDE 10-12.5 MG PO TABS: 1.0000 | ORAL_TABLET | Freq: Every day | ORAL | 0 refills | Status: DC

## 2020-04-13 ENCOUNTER — Other Ambulatory Visit: Payer: Self-pay

## 2020-04-13 ENCOUNTER — Encounter: Payer: Self-pay | Admitting: Family

## 2020-04-13 ENCOUNTER — Ambulatory Visit (INDEPENDENT_AMBULATORY_CARE_PROVIDER_SITE_OTHER): Payer: 59 | Admitting: Family

## 2020-04-13 VITALS — BP 129/78 | HR 106 | Temp 97.6°F | Ht 79.0 in | Wt 332.0 lb

## 2020-04-13 DIAGNOSIS — E1169 Type 2 diabetes mellitus with other specified complication: Secondary | ICD-10-CM | POA: Diagnosis not present

## 2020-04-13 DIAGNOSIS — I152 Hypertension secondary to endocrine disorders: Secondary | ICD-10-CM

## 2020-04-13 DIAGNOSIS — E1159 Type 2 diabetes mellitus with other circulatory complications: Secondary | ICD-10-CM

## 2020-04-13 DIAGNOSIS — I10 Essential (primary) hypertension: Secondary | ICD-10-CM

## 2020-04-13 DIAGNOSIS — E785 Hyperlipidemia, unspecified: Secondary | ICD-10-CM

## 2020-04-13 DIAGNOSIS — E119 Type 2 diabetes mellitus without complications: Secondary | ICD-10-CM

## 2020-04-13 DIAGNOSIS — Z1159 Encounter for screening for other viral diseases: Secondary | ICD-10-CM

## 2020-04-13 DIAGNOSIS — R252 Cramp and spasm: Secondary | ICD-10-CM

## 2020-04-13 LAB — BAYER DCA HB A1C WAIVED: HB A1C (BAYER DCA - WAIVED): 8.8 % — ABNORMAL HIGH (ref <7.0)

## 2020-04-13 MED ORDER — OZEMPIC (0.25 OR 0.5 MG/DOSE) 2 MG/1.5ML ~~LOC~~ SOPN: 0.5000 mg | PEN_INJECTOR | SUBCUTANEOUS | 2 refills | Status: DC

## 2020-04-13 MED ORDER — LISINOPRIL 20 MG PO TABS: 20.0000 mg | ORAL_TABLET | Freq: Every day | ORAL | 3 refills | Status: DC

## 2020-04-13 MED ORDER — DEXCOM G6 TRANSMITTER MISC: 4 refills | Status: DC

## 2020-04-13 MED ORDER — ONETOUCH VERIO VI STRP: ORAL_STRIP | 2 refills | Status: AC

## 2020-04-13 MED ORDER — DEXCOM G6 SENSOR MISC: 9 refills | Status: DC

## 2020-04-13 MED ORDER — DEXCOM G6 RECEIVER DEVI: 1 refills | Status: DC

## 2020-04-13 MED ORDER — ATORVASTATIN CALCIUM 20 MG PO TABS: 20.0000 mg | ORAL_TABLET | Freq: Every day | ORAL | 3 refills | Status: DC

## 2020-04-13 MED ORDER — METFORMIN HCL 500 MG PO TABS: 500.0000 mg | ORAL_TABLET | Freq: Two times a day (BID) | ORAL | 0 refills | Status: DC

## 2020-04-13 MED ORDER — CANAGLIFLOZIN 300 MG PO TABS: 300.0000 mg | ORAL_TABLET | Freq: Every day | ORAL | 2 refills | Status: DC

## 2020-04-13 NOTE — Progress Notes (Signed)
Subjective:    Patient ID: Derek Decker, male    DOB: 1986/05/19, 34 y.o.   MRN: [id number]  Chief Complaint  Patient presents with  . Medical Management of Chronic Issues   Pt presents to the office today to establish care. PT complaining of increased leg cramping since starting HCTZ.  Diabetes He presents for his follow-up diabetic visit. He has type 2 diabetes mellitus. His disease course has been stable. There are no hypoglycemic associated symptoms. Pertinent negatives for hypoglycemia include no headaches. Pertinent negatives for diabetes include no blurred vision and no foot paresthesias. There are no hypoglycemic complications. Symptoms are stable. Pertinent negatives for diabetic complications include no CVA, heart disease, nephropathy or peripheral neuropathy. Risk factors for coronary artery disease include hypertension, male sex and sedentary lifestyle. He is following a generally unhealthy diet. His overall blood glucose range is 130-140 mg/dl. An ACE inhibitor/angiotensin II receptor blocker is being taken. Eye exam is not current.  Hypertension This is a chronic problem. The current episode started more than 1 year ago. The problem has been resolved since onset. The problem is controlled. Pertinent negatives include no blurred vision, headaches, malaise/fatigue or shortness of breath. Risk factors for coronary artery disease include male gender and obesity. The current treatment provides moderate improvement. There is no history of kidney disease, CVA or heart failure.  Hyperlipidemia This is a new problem. The current episode started more than 1 year ago. Pertinent negatives include no shortness of breath.      Review of Systems  Constitutional: Negative for malaise/fatigue.  Eyes: Negative for blurred vision.  Respiratory: Negative for shortness of breath.   Neurological: Negative for headaches.  All other systems reviewed and are negative.      Objective:   Physical  Exam Vitals reviewed.  Constitutional:      General: He is not in acute distress.    Appearance: He is well-developed. He is obese.  HENT:     Head: Normocephalic.     Right Ear: Tympanic membrane normal.     Left Ear: Tympanic membrane normal.  Eyes:     General:        Right eye: No discharge.        Left eye: No discharge.     Pupils: Pupils are equal, round, and reactive to light.  Neck:     Thyroid: No thyromegaly.  Cardiovascular:     Rate and Rhythm: Normal rate and regular rhythm.     Heart sounds: Normal heart sounds. No murmur heard.   Pulmonary:     Effort: Pulmonary effort is normal. No respiratory distress.     Breath sounds: Normal breath sounds. No wheezing.  Abdominal:     General: Bowel sounds are normal. There is no distension.     Palpations: Abdomen is soft.     Tenderness: There is no abdominal tenderness.  Musculoskeletal:        General: No tenderness. Normal range of motion.     Cervical back: Normal range of motion and neck supple.  Skin:    General: Skin is warm and dry.     Findings: No erythema or rash.  Neurological:     Mental Status: He is alert and oriented to person, place, and time.     Cranial Nerves: No cranial nerve deficit.     Deep Tendon Reflexes: Reflexes are normal and symmetric.  Psychiatric:        Behavior: Behavior normal.  Thought Content: Thought content normal.        Judgment: Judgment normal.       BP 129/78   Pulse (!) 106   Temp 97.6 F (36.4 C) (Temporal)   Ht '6\' 7"'  (2.007 m)   Wt (!) 332 lb (150.6 kg)   BMI 37.40 kg/m      Assessment & Plan:  RAISTLIN GUM comes in today with chief complaint of Medical Management of Chronic Issues   Diagnosis and orders addressed:  1. Type 2 diabetes mellitus without complication, without long-term current use of insulin ([place]) Will add Ozempic today Will order Dexcom to see if this is covered since Elenor Legato is too expensive Follow up with Clinical Pharm for  diabetic education Follow up in 1 month for recheck - Bayer DCA Hb A1c Waived - canagliflozin (INVOKANA) 300 MG TABS tablet; Take 1 tablet (300 mg total) by mouth daily before breakfast.  Dispense: 90 tablet; Refill: 2 - metFORMIN (GLUCOPHAGE) 500 MG tablet; Take 1 tablet (500 mg total) by mouth 2 (two) times daily.  Dispense: 180 tablet; Refill: 0 - Semaglutide,0.25 or 0.5MG/DOS, (OZEMPIC, 0.25 OR 0.5 MG/DOSE,) 2 MG/1.5ML SOPN; Inject 0.375 mLs (0.5 mg total) into the skin once a week.  Dispense: 1 pen; Refill: 2 - Continuous Blood Gluc Receiver (DEXCOM G6 RECEIVER) DEVI; Use to check Blood glucose up to 4 times daily  Dispense: 1 each; Refill: 1 - Continuous Blood Gluc Transmit (DEXCOM G6 TRANSMITTER) MISC; Use to check Blood glucose up to 4 times daily  Dispense: 1 each; Refill: 4 - Continuous Blood Gluc Sensor (DEXCOM G6 SENSOR) MISC; Use to check Blood glucose up to 4 times daily  Dispense: 90 each; Refill: 9 - CMP14+EGFR - CBC with Differential/Platelet  2. Hypertension associated with type 2 diabetes mellitus ([employer]) Will stop HCTZ 12.5 mg related to leg cramps and will increase Lisinopril to 20 mg from 10 mg -Dash diet information given -Exercise encouraged - Stress Management  -Continue current meds -RTO in 4 weeks  - lisinopril (ZESTRIL) 20 MG tablet; Take 1 tablet (20 mg total) by mouth daily.  Dispense: 90 tablet; Refill: 3 - CMP14+EGFR - CBC with Differential/Platelet  3. Morbid obesity (HCC) - canagliflozin (INVOKANA) 300 MG TABS tablet; Take 1 tablet (300 mg total) by mouth daily before breakfast.  Dispense: 90 tablet; Refill: 2 - CMP14+EGFR - CBC with Differential/Platelet  4. Hyperlipidemia associated with type 2 diabetes mellitus ([place]) Will start Lipitor today - atorvastatin (LIPITOR) 20 MG tablet; Take 1 tablet (20 mg total) by mouth daily.  Dispense: 90 tablet; Refill: 3 - CMP14+EGFR - CBC with Differential/Platelet  5. Need for hepatitis C screening test -  CMP14+EGFR - CBC with Differential/Platelet - Hepatitis C antibody  6. Leg cramp Stop HCTZ    Labs pending Health Maintenance reviewed Diet and exercise encouraged  Follow up plan: 1 month to recheck HTN and DM  Evelina Dun, FNP

## 2020-04-13 NOTE — Patient Instructions (Signed)

## 2020-04-14 ENCOUNTER — Other Ambulatory Visit: Payer: Self-pay | Admitting: Family

## 2020-04-14 DIAGNOSIS — R748 Abnormal levels of other serum enzymes: Secondary | ICD-10-CM

## 2020-04-14 DIAGNOSIS — K760 Fatty (change of) liver, not elsewhere classified: Secondary | ICD-10-CM

## 2020-04-14 LAB — CBC WITH DIFFERENTIAL/PLATELET
Basophils Absolute: 0.1 10*3/uL (ref 0.0–0.2)
Basos: 1 %
EOS (ABSOLUTE): 0.3 10*3/uL (ref 0.0–0.4)
Eos: 3 %
Hematocrit: 43.6 % (ref 37.5–51.0)
Hemoglobin: 14.1 g/dL (ref 13.0–17.7)
Immature Grans (Abs): 0.1 10*3/uL (ref 0.0–0.1)
Immature Granulocytes: 1 %
Lymphocytes Absolute: 2.4 10*3/uL (ref 0.7–3.1)
Lymphs: 28 %
MCH: 29.7 pg (ref 26.6–33.0)
MCHC: 32.3 g/dL (ref 31.5–35.7)
MCV: 92 fL (ref 79–97)
Monocytes Absolute: 0.5 10*3/uL (ref 0.1–0.9)
Monocytes: 6 %
Neutrophils Absolute: 5.2 10*3/uL (ref 1.4–7.0)
Neutrophils: 61 %
Platelets: 287 10*3/uL (ref 150–450)
RBC: 4.74 x10E6/uL (ref 4.14–5.80)
RDW: 12.2 % (ref 11.6–15.4)
WBC: 8.4 10*3/uL (ref 3.4–10.8)

## 2020-04-14 LAB — CMP14+EGFR
ALT: 52 IU/L — ABNORMAL HIGH (ref 0–44)
AST: 29 IU/L (ref 0–40)
Albumin/Globulin Ratio: 1.7 (ref 1.2–2.2)
Albumin: 4.5 g/dL (ref 4.0–5.0)
Alkaline Phosphatase: 67 IU/L (ref 48–121)
BUN/Creatinine Ratio: 14 (ref 9–20)
BUN: 17 mg/dL (ref 6–20)
Bilirubin Total: 0.5 mg/dL (ref 0.0–1.2)
CO2: 22 mmol/L (ref 20–29)
Calcium: 9.6 mg/dL (ref 8.7–10.2)
Chloride: 96 mmol/L (ref 96–106)
Creatinine, Ser: 1.23 mg/dL (ref 0.76–1.27)
GFR calc Af Amer: 88 mL/min/{1.73_m2} (ref >59)
GFR calc non Af Amer: 76 mL/min/{1.73_m2} (ref >59)
Globulin, Total: 2.6 g/dL (ref 1.5–4.5)
Glucose: 204 mg/dL — ABNORMAL HIGH (ref 65–99)
Potassium: 4.4 mmol/L (ref 3.5–5.2)
Sodium: 133 mmol/L — ABNORMAL LOW (ref 134–144)
Total Protein: 7.1 g/dL (ref 6.0–8.5)

## 2020-04-14 LAB — HEPATITIS C ANTIBODY: Hep C Virus Ab: <0.1 s/co ratio (ref 0.0–0.9)

## 2020-04-22 ENCOUNTER — Encounter: Payer: Self-pay | Admitting: Internal Medicine

## 2020-06-14 ENCOUNTER — Other Ambulatory Visit: Payer: Self-pay

## 2020-06-14 ENCOUNTER — Encounter: Payer: Self-pay | Admitting: Family

## 2020-06-14 ENCOUNTER — Ambulatory Visit (INDEPENDENT_AMBULATORY_CARE_PROVIDER_SITE_OTHER): Payer: 59 | Admitting: Family

## 2020-06-14 VITALS — BP 127/81 | HR 96 | Temp 97.8°F | Ht 79.0 in | Wt 325.2 lb

## 2020-06-14 DIAGNOSIS — E1169 Type 2 diabetes mellitus with other specified complication: Secondary | ICD-10-CM | POA: Diagnosis not present

## 2020-06-14 DIAGNOSIS — E119 Type 2 diabetes mellitus without complications: Secondary | ICD-10-CM | POA: Diagnosis not present

## 2020-06-14 DIAGNOSIS — E1159 Type 2 diabetes mellitus with other circulatory complications: Secondary | ICD-10-CM | POA: Diagnosis not present

## 2020-06-14 DIAGNOSIS — K760 Fatty (change of) liver, not elsewhere classified: Secondary | ICD-10-CM

## 2020-06-14 DIAGNOSIS — I10 Essential (primary) hypertension: Secondary | ICD-10-CM

## 2020-06-14 DIAGNOSIS — E785 Hyperlipidemia, unspecified: Secondary | ICD-10-CM

## 2020-06-14 LAB — BAYER DCA HB A1C WAIVED: HB A1C (BAYER DCA - WAIVED): 7.1 % — ABNORMAL HIGH (ref <7.0)

## 2020-06-14 NOTE — Patient Instructions (Signed)

## 2020-06-14 NOTE — Progress Notes (Signed)
Subjective:    Patient ID: Derek Decker, male    DOB: October 23, 1985, 34 y.o.   MRN: [id number]  Chief Complaint  Patient presents with   Hypertension   Diabetes   Pt presents to the office today for chronic follow up. Her A1C was elevated and we started on Ozempic.  Hypertension This is a chronic problem. The current episode started more than 1 year ago. The problem has been resolved since onset. The problem is controlled. Pertinent negatives include no blurred vision, malaise/fatigue, peripheral edema or shortness of breath. Risk factors for coronary artery disease include obesity, male gender and sedentary lifestyle. The current treatment provides moderate improvement. There is no history of CVA or heart failure.  Diabetes He presents for his follow-up diabetic visit. He has type 2 diabetes mellitus. His disease course has been stable. There are no hypoglycemic associated symptoms. Pertinent negatives for diabetes include no blurred vision and no foot paresthesias. Symptoms are stable. Pertinent negatives for diabetic complications include no CVA, heart disease, nephropathy or peripheral neuropathy. Risk factors for coronary artery disease include male sex, hypertension, sedentary lifestyle, dyslipidemia and diabetes mellitus. He is following a generally unhealthy diet. His overall blood glucose range is 110-130 mg/dl. An ACE inhibitor/angiotensin II receptor blocker is being taken. Eye exam is not current.  Hyperlipidemia This is a chronic problem. The current episode started more than 1 year ago. The problem is controlled. Recent lipid tests were reviewed and are normal. Pertinent negatives include no shortness of breath. Current antihyperlipidemic treatment includes statins. The current treatment provides moderate improvement of lipids. Risk factors for coronary artery disease include dyslipidemia, diabetes mellitus, male sex, hypertension and a sedentary lifestyle.      Review of Systems    Constitutional: Negative for malaise/fatigue.  Eyes: Negative for blurred vision.  Respiratory: Negative for shortness of breath.   All other systems reviewed and are negative.      Objective:   Physical Exam Vitals reviewed.  Constitutional:      General: He is not in acute distress.    Appearance: He is well-developed. He is obese.  HENT:     Head: Normocephalic.     Right Ear: Tympanic membrane normal.     Left Ear: Tympanic membrane normal.  Eyes:     General:        Right eye: No discharge.        Left eye: No discharge.     Pupils: Pupils are equal, round, and reactive to light.  Neck:     Thyroid: No thyromegaly.  Cardiovascular:     Rate and Rhythm: Normal rate and regular rhythm.     Heart sounds: Normal heart sounds. No murmur heard.   Pulmonary:     Effort: Pulmonary effort is normal. No respiratory distress.     Breath sounds: Normal breath sounds. No wheezing.  Abdominal:     General: Bowel sounds are normal. There is no distension.     Palpations: Abdomen is soft.     Tenderness: There is no abdominal tenderness.  Musculoskeletal:        General: No tenderness. Normal range of motion.     Cervical back: Normal range of motion and neck supple.  Skin:    General: Skin is warm and dry.     Findings: No erythema or rash.  Neurological:     Mental Status: He is alert and oriented to person, place, and time.     Cranial Nerves: No cranial  nerve deficit.     Deep Tendon Reflexes: Reflexes are normal and symmetric.  Psychiatric:        Behavior: Behavior normal.        Thought Content: Thought content normal.        Judgment: Judgment normal.       BP 127/81    Pulse 96    Temp 97.8 F (36.6 C) (Temporal)    Ht _0  (2.007 m)    Wt (!) 325 lb 3.2 oz (147.5 kg)    SpO2 97%    BMI 36.64 kg/m      Assessment & Plan:  Derek Decker comes in today with chief complaint of Hypertension and Diabetes   Diagnosis and orders addressed:  1. Hypertension  associated with type 2 diabetes mellitus (HCC) - BMP8+EGFR  2. Hyperlipidemia associated with type 2 diabetes mellitus (HCC) - BMP8+EGFR  3. Type 2 diabetes mellitus without complication, without long-term current use of insulin (HCC)  - Bayer DCA Hb A1c Waived - BMP8+EGFR  4. Morbid obesity (HCC) - BMP8+EGFR  5. Hepatic steatosis - BMP8+EGFR   Labs pending He will make an appointment with Clinical Pharm to discuss  Diabetic education and to see if he can get libre or dexcom cheaper.  Health Maintenance reviewed Diet and exercise encouraged  Follow up plan: 3 months    Evelina Dun, FNP

## 2020-06-15 LAB — BMP8+EGFR
BUN/Creatinine Ratio: 10 (ref 9–20)
BUN: 9 mg/dL (ref 6–20)
CO2: 23 mmol/L (ref 20–29)
Calcium: 9.5 mg/dL (ref 8.7–10.2)
Chloride: 104 mmol/L (ref 96–106)
Creatinine, Ser: 0.91 mg/dL (ref 0.76–1.27)
GFR calc Af Amer: 126 mL/min/{1.73_m2} (ref >59)
GFR calc non Af Amer: 109 mL/min/{1.73_m2} (ref >59)
Glucose: 93 mg/dL (ref 65–99)
Potassium: 4.6 mmol/L (ref 3.5–5.2)
Sodium: 141 mmol/L (ref 134–144)

## 2020-06-25 ENCOUNTER — Encounter: Payer: Self-pay | Admitting: Gastroenterology

## 2020-06-25 NOTE — Progress Notes (Signed)
Referring Provider: [name], FNP Primary Care Physician:  [name], FNP Primary Gastroenterologist:  Dr. Jena Gauss  Chief Complaint  Patient presents with  . Elevated Hepatic Enzymes    HPI:   Derek Decker is a 34 y.o. male presenting today at the request of [name], FNP for elevated liver enzymes and hepatic steatosis.   CT A/P with contrast December 2019 with hepatic steatosis. Normal hepatic contour. Normal biliary tree.  Labs in June 2021 with ALT 52, platelets within normal limits, hep C Ab negative. August 2021, hemoglobin A1C 7.1, BMP wnl.   LFTs (AST and ALT)  have been mildly elevated since September 2020.   Today:  No alcohol currently. Last drank about 1 year ago. No history of heavy alcohol use. No history of drug use. No known exposure to hepatitis. No tattoos. No family history of liver disease. No swelling in abdomina or LE. No bruising, bleeding, jaundice, or confusion. No tylenol for NSAID use. Just started Lipitor in June 2021.   No abdominal pain. BMs daily. At most 4 BMs per day. Stool can be loose or formed. Since starting metformin, his bowels have become loose. Prior to metformin, he would have 2 BMs per day.  No blood in the stool or black stool. No nausea or vomiting. Admits to GERD symptoms 3-4 times a week. Present for years. Will take OTC tums. No dysphagia.   Fast foods 2-3 times a week. No spicy foods. Drinks carbonated beverages daily. Noted onions cause the worst GERD symptoms.   Past Medical History:  Diagnosis Date  . Diabetes mellitus without complication (HCC)   . HLD (hyperlipidemia)   . Hypertension     Past Surgical History:  Procedure Laterality Date  . FRACTURE SURGERY Left 2016   left foot    Current Outpatient Medications  Medication Sig Dispense Refill  . atorvastatin (LIPITOR) 20 MG tablet Take 1 tablet (20 mg total) by mouth daily. 90 tablet 3  . canagliflozin (INVOKANA) 300 MG TABS tablet Take 1 tablet  (300 mg total) by mouth daily before breakfast. 90 tablet 2  . lisinopril (ZESTRIL) 20 MG tablet Take 1 tablet (20 mg total) by mouth daily. 90 tablet 3  . metFORMIN (GLUCOPHAGE) 500 MG tablet Take 1 tablet (500 mg total) by mouth 2 (two) times daily. 180 tablet 0  . Semaglutide,0.25 or 0.5MG /DOS, (OZEMPIC, 0.25 OR 0.5 MG/DOSE,) 2 MG/1.5ML SOPN Inject 0.375 mLs (0.5 mg total) into the skin once a week. 1 pen 2  . Continuous Blood Gluc Receiver (DEXCOM G6 RECEIVER) DEVI Use to check Blood glucose up to 4 times daily (Patient not taking: Reported on 06/27/2020) 1 each 1  . Continuous Blood Gluc Sensor (DEXCOM G6 SENSOR) MISC Use to check Blood glucose up to 4 times daily (Patient not taking: Reported on 06/14/2020) 90 each 9  . Continuous Blood Gluc Transmit (DEXCOM G6 TRANSMITTER) MISC Use to check Blood glucose up to 4 times daily (Patient not taking: Reported on 06/14/2020) 1 each 4  . glucose blood (ONETOUCH VERIO) test strip USE 1 STRIP TO CHECK GLUCOSE four times a day (Patient not taking: Reported on 06/27/2020) 100 each 2   No current facility-administered medications for this visit.    Allergies as of 06/27/2020  . (No Known Allergies)    Family History  Problem Relation Age of Onset  . Hypertension Mother   . Hypertension Father   . Liver disease Neg Hx   . Colon cancer  Neg Hx     Social History   Socioeconomic History  . Marital status: Single    Spouse name: Not on file  . Number of children: Not on file  . Years of education: Not on file  . Highest education level: Not on file  Occupational History  . Not on file  Tobacco Use  . Smoking status: Former Smoker    Packs/day: 0.25    Years: 3.00    Pack years: 0.75  . Smokeless tobacco: Never Used  Vaping Use  . Vaping Use: Never used  Substance and Sexual Activity  . Alcohol use: Not Currently  . Drug use: No  . Sexual activity: Not on file  Other Topics Concern  . Not on file  Social History Narrative  . Not on  file   Social Determinants of Health   Financial Resource Strain:   . Difficulty of Paying Living Expenses: Not on file  Food Insecurity:   . Worried About Programme researcher, broadcasting/film/video in the Last Year: Not on file  . Ran Out of Food in the Last Year: Not on file  Transportation Needs:   . Lack of Transportation (Medical): Not on file  . Lack of Transportation (Non-Medical): Not on file  Physical Activity:   . Days of Exercise per Week: Not on file  . Minutes of Exercise per Session: Not on file  Stress:   . Feeling of Stress : Not on file  Social Connections:   . Frequency of Communication with Friends and Family: Not on file  . Frequency of Social Gatherings with Friends and Family: Not on file  . Attends Religious Services: Not on file  . Active Member of Clubs or Organizations: Not on file  . Attends Banker Meetings: Not on file  . Marital Status: Not on file  Intimate Partner Violence:   . Fear of Current or Ex-Partner: Not on file  . Emotionally Abused: Not on file  . Physically Abused: Not on file  . Sexually Abused: Not on file    Review of Systems: Gen: Denies any fever, chills, cold or flulike symptoms, lightheadedness, dizziness, presyncope, syncope. CV: Denies chest pain or heart palpitations. Resp: Denies shortness of breath or cough. GI: See HPI GU : Denies dysuria or hematuria. MS: Denies joint pain Derm: Denies rash Psych: Denies depression or anxiety Heme: See HPI  Physical Exam: BP (!) 146/82   Pulse 92   Temp (!) 97 F (36.1 C) (Oral)   Ht 6\' 7"  (2.007 m)   Wt (!) 329 lb 12.8 oz (149.6 kg)   BMI 37.15 kg/m  General:   Alert and oriented. Pleasant and cooperative. Well-nourished and well-developed.  Head:  Normocephalic and atraumatic. Eyes:  Without icterus, sclera clear and conjunctiva pink.  Ears:  Normal auditory acuity. Lungs:  Clear to auscultation bilaterally. No wheezes, rales, or rhonchi. No distress.  Heart:  S1, S2 present  without murmurs appreciated.  Abdomen:  +BS, soft, non-tender and non-distended. No HSM noted. No guarding or rebound. No masses appreciated.  Rectal:  Deferred  Msk:  Symmetrical without gross deformities. Normal posture. Extremities:  Without edema. Neurologic:  Alert and  oriented x4;  grossly normal neurologically. Skin:  Intact without significant lesions or rashes.. Psych:  Normal mood and affect.

## 2020-06-27 ENCOUNTER — Ambulatory Visit (INDEPENDENT_AMBULATORY_CARE_PROVIDER_SITE_OTHER): Payer: 59 | Admitting: Gastroenterology

## 2020-06-27 ENCOUNTER — Other Ambulatory Visit: Payer: Self-pay

## 2020-06-27 ENCOUNTER — Encounter: Payer: Self-pay | Admitting: Gastroenterology

## 2020-06-27 ENCOUNTER — Encounter: Payer: Self-pay | Admitting: *Deleted

## 2020-06-27 VITALS — BP 146/82 | HR 92 | Temp 97.0°F | Ht 79.0 in | Wt 329.8 lb

## 2020-06-27 DIAGNOSIS — K219 Gastro-esophageal reflux disease without esophagitis: Secondary | ICD-10-CM | POA: Diagnosis not present

## 2020-06-27 DIAGNOSIS — K760 Fatty (change of) liver, not elsewhere classified: Secondary | ICD-10-CM

## 2020-06-27 DIAGNOSIS — R7989 Other specified abnormal findings of blood chemistry: Secondary | ICD-10-CM | POA: Diagnosis not present

## 2020-06-27 NOTE — Assessment & Plan Note (Signed)
34 year old male reporting several year history of frequent GERD symptoms. No alarm symptoms. I suspect this is likely secondary to dietary habits and body habitus with BMI 37. We have discussed a GERD diet/lifestyle as well as the importance of weight loss through diet and exercise. GERD handout was provided. We will try these measures for the next 4 months. If symptoms are not well controlled despite these efforts, will trial PPI. Plan to follow-up in 4 months. Advised to call with any concerns prior to next office visit.

## 2020-06-27 NOTE — Assessment & Plan Note (Addendum)
34-year-old male with history of mildly elevated AST and ALT since September 2020. Most recent labs on file from June 2021 improved with ALT 52 (H), AST 29 (WNL). Alk phos and total bilirubin remain within normal limits. Platelets within normal limits. Hepatitis C antibody negative in June 2021. Most recent imaging on file is a CT abdomen pelvis from December 2019 with evidence of hepatic steatosis. Spleen within normal limits. He has no signs or symptoms of decompensated liver disease. Denies any history of significant alcohol use or history of drug use. No regular Tylenol use. He is on Lipitor but this was just started in June 2021.  I am suspicious that elevated LFTs are secondary to fatty liver with history of diabetes, HTN, and obesity. We discussed the potential clinical course of fatty liver with possibility to progress to cirrhosis over time.  Plan: Update HFP Complete B serologies Complete iron panel Ultrasound abdomen complete Instructions for fatty liver:  Recommend 1-2# weight loss per week until ideal body weight through exercise & diet.  Low fat/cholesterol diet.    Avoid sweets, sodas, fruit juices, sweetened beverages like tea, etc.  Gradually increase exercise from 15 min daily up to 1 hr per day 5 days/week.  Limit alcohol use. Follow-up in 4 months. 

## 2020-06-27 NOTE — Patient Instructions (Addendum)
Please have labs and ultrasound completed at [facility].  Instructions for fatty liver: Recommend 1-2# weight loss per week until ideal body weight through exercise & diet. Low fat/cholesterol diet.   Avoid sweets, sodas, fruit juices, sweetened beverages like tea, etc. Gradually increase exercise from 15 min daily up to 1 hr per day 5 days/week. Limit alcohol use.  Follow a GERD diet:  Avoid fried, fatty, greasy, spicy, citrus foods. Avoid caffeine and carbonated beverages. Avoid chocolate. Try eating 4-6 small meals a day rather than 3 large meals. Do not eat within 3 hours of laying down. Prop head of bed up on wood or bricks to create a 6 inch incline.  We will plan to see back in the office in 4 months. Do not hesitate to call with questions or concerns prior.  Ermalinda Memos, PA-C [facility]   Nonalcoholic Fatty Liver Disease Diet, Adult Nonalcoholic fatty liver disease is a condition that causes fat to build up in and around the liver. The disease makes it harder for the liver to work the way that it should. Following a healthy diet can help to keep nonalcoholic fatty liver disease under control. It can also help to prevent or improve conditions that are associated with the disease, such as heart disease, diabetes, high blood pressure, and abnormal cholesterol levels. Along with regular exercise, this diet:  Promotes weight loss.  Helps to control blood sugar levels.  Helps to improve the way that the body uses insulin. What are tips for following this plan? Reading food labels Always check food labels for:  The amount of saturated fat in a food. You should limit your intake of saturated fat. Saturated fat is found in foods that come from animals, including meat and dairy products such as butter, cheese, and whole milk.  The amount of fiber in a food. You should choose high-fiber foods such as fruits, vegetables, and whole grains. Try to get  25-30 grams (g) of fiber a day.  Cooking  When cooking, use heart-healthy oils that are high in monounsaturated fats. These include olive oil, canola oil, and avocado oil.  Limit frying or deep-frying foods. Cook foods using healthy methods such as baking, boiling, steaming, and grilling instead. Meal planning  You may want to keep track of how many calories you take in. Eating the right amount of calories will help you achieve a healthy weight. Meeting with a registered dietitian can help you get started.  Limit how often you eat takeout and fast food. These foods are usually very high in fat, salt, and sugar.  Use the glycemic index (GI) to plan your meals. The index tells you how quickly a food will raise your blood sugar. Choose low-GI foods (GI less than 55). These foods take a longer time to raise blood sugar. A registered dietitian can help you identify foods lower on the GI scale. Lifestyle  You may want to follow a Mediterranean diet. This diet includes a lot of vegetables, lean meats or fish, whole grains, fruits, and healthy oils and fats. What foods can I eat?  Fruits Bananas. Apples. Oranges. Grapes. Papaya. Mango. Pomegranate. Kiwi. Grapefruit. Cherries. Vegetables Lettuce. Spinach. Peas. Beets. Cauliflower. Cabbage. Broccoli. Carrots. Tomatoes. Squash. Eggplant. Herbs. Peppers. Onions. Cucumbers. Brussels sprouts. Yams and sweet potatoes. Beans. Lentils. Grains Whole wheat or whole-grain foods, including breads, crackers, cereals, and pasta. Stone-ground whole wheat. Unsweetened oatmeal. Bulgur. Barley. Quinoa. Brown or wild rice. Corn or whole wheat flour tortillas. Meats and other  proteins Lean meats. Poultry. Tofu. Seafood and shellfish. Dairy Low-fat or fat-free dairy products, such as yogurt, cottage cheese, or cheese. Beverages Water. Sugar-free drinks. Tea. Coffee. Low-fat or skim milk. Milk alternatives, such as soy or almond milk. Real fruit juice. Fats and  oils Avocado. Canola or olive oil. Nuts and nut butters. Seeds. Seasonings and condiments Mustard. Relish. Low-fat, low-sugar ketchup and barbecue sauce. Low-fat or fat-free mayonnaise. Sweets and desserts Sugar-free sweets. The items listed above may not be a complete list of foods and beverages you can eat. Contact a dietitian for more information. What foods should I limit or avoid? Meats and other proteins Limit red meat to 1-2 times a week. Dairy [employer]. Fats and oils Palm oil and coconut oil. Fried foods. Other foods Processed foods. Foods that contain a lot of salt or sodium. Sweets and desserts Sweets that contain sugar. Beverages Sweetened drinks, such as sweet tea, milkshakes, iced sweet drinks, and sodas. Alcohol. The items listed above may not be a complete list of foods and beverages you should avoid. Contact a dietitian for more information. Where to find more information The [employer] of Diabetes and Digestive and Kidney Diseases: [email or web address] Summary  Nonalcoholic fatty liver disease is a condition that causes fat to build up in and around the liver.  Following a healthy diet can help to keep nonalcoholic fatty liver disease under control. Your diet should be rich in fruits, vegetables, whole grains, and lean proteins.  Limit your intake of saturated fat. Saturated fat is found in foods that come from animals, including meat and dairy products such as butter, cheese, and whole milk.  This diet promotes weight loss, helps to control blood sugar levels, and helps to improve the way that the body uses insulin. This information is not intended to replace advice given to you by your health care provider. Make sure you discuss any questions you have with your health care provider. Document Revised: 01/23/2019 Document Reviewed: 10/23/2018 Elsevier Patient Education  2020 [employer].  Food Choices for Gastroesophageal Reflux Disease, Adult When you  have gastroesophageal reflux disease (GERD), the foods you eat and your eating habits are very important. Choosing the right foods can help ease the discomfort of GERD. Consider working with a diet and nutrition specialist (dietitian) to help you make healthy food choices. What general guidelines should I follow?  Eating plan  Choose healthy foods low in fat, such as fruits, vegetables, whole grains, low-fat dairy products, and lean meat, fish, and poultry.  Eat frequent, small meals instead of three large meals each day. Eat your meals slowly, in a relaxed setting. Avoid bending over or lying down until 2-3 hours after eating.  Limit high-fat foods such as fatty meats or fried foods.  Limit your intake of oils, butter, and shortening to less than 8 teaspoons each day.  Avoid the following: ? Foods that cause symptoms. These may be different for different people. Keep a food diary to keep track of foods that cause symptoms. ? Alcohol. ? Drinking large amounts of liquid with meals. ? Eating meals during the 2-3 hours before bed.  Cook foods using methods other than frying. This may include baking, grilling, or broiling. Lifestyle  Maintain a healthy weight. Ask your health care provider what weight is healthy for you. If you need to lose weight, work with your health care provider to do so safely.  Exercise for at least 30 minutes on 5 or more days each week, or as  told by your health care provider.  Avoid wearing clothes that fit tightly around your waist and chest.  Do not use any products that contain nicotine or tobacco, such as cigarettes and e-cigarettes. If you need help quitting, ask your health care provider.  Sleep with the head of your bed raised. Use a wedge under the mattress or blocks under the bed frame to raise the head of the bed. What foods are not recommended? The items listed may not be a complete list. Talk with your dietitian about what dietary choices are best  for you. Grains Pastries or quick breads with added fat. [place] toast. Vegetables Deep fried vegetables. [place] fries. Any vegetables prepared with added fat. Any vegetables that cause symptoms. For some people this may include tomatoes and tomato products, chili peppers, onions and garlic, and horseradish. Fruits Any fruits prepared with added fat. Any fruits that cause symptoms. For some people this may include citrus fruits, such as oranges, grapefruit, pineapple, and lemons. Meats and other protein foods High-fat meats, such as fatty beef or pork, hot dogs, ribs, ham, sausage, salami and bacon. Fried meat or protein, including fried fish and fried chicken. Nuts and nut butters. Dairy Whole milk and chocolate milk. Sour cream. Cream. Ice cream. Cream cheese. Milk shakes. Beverages Coffee and tea, with or without caffeine. Carbonated beverages. Sodas. Energy drinks. Fruit juice made with acidic fruits (such as orange or grapefruit). Tomato juice. Alcoholic drinks. Fats and oils Butter. Margarine. Shortening. Ghee. Sweets and desserts Chocolate and cocoa. Donuts. Seasoning and other foods Pepper. Peppermint and spearmint. Any condiments, herbs, or seasonings that cause symptoms. For some people, this may include curry, hot sauce, or vinegar-based salad dressings. Summary  When you have gastroesophageal reflux disease (GERD), food and lifestyle choices are very important to help ease the discomfort of GERD.  Eat frequent, small meals instead of three large meals each day. Eat your meals slowly, in a relaxed setting. Avoid bending over or lying down until 2-3 hours after eating.  Limit high-fat foods such as fatty meat or fried foods. This information is not intended to replace advice given to you by your health care provider. Make sure you discuss any questions you have with your health care provider. Document Revised: 01/22/2019 Document Reviewed: 10/02/2016 Elsevier Patient Education   2020 [employer].

## 2020-06-27 NOTE — Assessment & Plan Note (Signed)
Addressed under elevated LFTs. 

## 2020-06-28 ENCOUNTER — Other Ambulatory Visit: Payer: Self-pay | Admitting: Family

## 2020-06-28 DIAGNOSIS — E1159 Type 2 diabetes mellitus with other circulatory complications: Secondary | ICD-10-CM

## 2020-06-28 NOTE — Telephone Encounter (Signed)
°  Prescription Request  06/28/2020  What is the name of the medication or equipment? Lisinopril 20 mg  Have you contacted your pharmacy to request a refill? (if applicable) YES  Which pharmacy would you like this sent to? [facility]   Patient notified that their request is being sent to the clinical staff for review and that they should receive a response within 2 business days.

## 2020-06-29 NOTE — Telephone Encounter (Signed)
Pt gets meds in a pack from [facility], they gave him the HCTZ-Lisinopril combination. Straightened this out with [facility] here in [place], the combination was deactivated and remaining refills for Lisinopril 20 mg was given verbally since this one was deactivated in [facility]'s system as well

## 2020-07-04 ENCOUNTER — Other Ambulatory Visit: Payer: Self-pay

## 2020-07-04 ENCOUNTER — Ambulatory Visit (HOSPITAL_COMMUNITY)
Admission: RE | Admit: 2020-07-04 | Discharge: 2020-07-04 | Disposition: A | Discharge status: Home / Self Care | Payer: 59 | Source: Ambulatory Visit | Attending: Gastroenterology | Admitting: Gastroenterology

## 2020-07-04 ENCOUNTER — Other Ambulatory Visit (HOSPITAL_COMMUNITY)
Admission: RE | Admit: 2020-07-04 | Discharge: 2020-07-04 | Disposition: A | Discharge status: Home / Self Care | Payer: 59 | Source: Ambulatory Visit | Attending: Gastroenterology | Admitting: Gastroenterology

## 2020-07-04 ENCOUNTER — Other Ambulatory Visit: Payer: Self-pay | Admitting: *Deleted

## 2020-07-04 DIAGNOSIS — R7989 Other specified abnormal findings of blood chemistry: Secondary | ICD-10-CM | POA: Insufficient documentation

## 2020-07-04 DIAGNOSIS — K760 Fatty (change of) liver, not elsewhere classified: Secondary | ICD-10-CM | POA: Diagnosis not present

## 2020-07-04 DIAGNOSIS — K838 Other specified diseases of biliary tract: Secondary | ICD-10-CM

## 2020-07-04 DIAGNOSIS — R17 Unspecified jaundice: Secondary | ICD-10-CM

## 2020-07-04 LAB — HEPATITIS B CORE ANTIBODY, TOTAL: Hep B Core Total Ab: NONREACTIVE

## 2020-07-04 LAB — HEPATIC FUNCTION PANEL
ALT: 57 U/L — ABNORMAL HIGH (ref 0–44)
AST: 36 U/L (ref 15–41)
Albumin: 3.9 g/dL (ref 3.5–5.0)
Alkaline Phosphatase: 49 U/L (ref 38–126)
Bilirubin, Direct: 0.2 mg/dL (ref 0.0–0.2)
Indirect Bilirubin: 1.5 mg/dL — ABNORMAL HIGH (ref 0.3–0.9)
Total Bilirubin: 1.7 mg/dL — ABNORMAL HIGH (ref 0.3–1.2)
Total Protein: 6.8 g/dL (ref 6.5–8.1)

## 2020-07-04 LAB — IRON AND TIBC
Iron: 86 ug/dL (ref 45–182)
Saturation Ratios: 24 % (ref 17.9–39.5)
TIBC: 355 ug/dL (ref 250–450)
UIBC: 269 ug/dL

## 2020-07-04 LAB — FERRITIN: Ferritin: 248 ng/mL (ref 24–336)

## 2020-07-04 LAB — HEPATITIS B SURFACE ANTIGEN: Hepatitis B Surface Ag: NONREACTIVE

## 2020-07-04 NOTE — Progress Notes (Signed)
Iron panel within normal limits. ALT is elevated but fairly stable. He has mildly elevated bilirubin at 1.7 (indirect 1.5, direct 0.2), bilirubin was 0.5, 2 months ago.   His [place] again reveals he has fatty liver as we discussed at his OV. Also with borderline enlarged CBD at 0.7cm with recommendations for MRI if concern for biliary obstruction.  We are waiting on hepatitis labs to result.  Spoke with patient. He has no abdominal pain, nausea or vomiting.   Recommendations:  1. Due to elevated bilirubin and dilated CBD, we will need to pursue MRI/MRCP to evaluate this further.  I have discussed this with patient and he is agreeable to arranging.  [facility] clinical pool, please arrange. 2.  Advised if he develops any abdominal pain, nausea, or vomiting, to please call our office. If any significant symptoms or if he is unable to reach our office, he should proceed to the emergency room. 3.  We will call him with hepatitis labs once they result.

## 2020-07-05 LAB — HEPATITIS B SURFACE ANTIBODY, QUANTITATIVE: Hep B S AB Quant (Post): <3.1 m[IU]/mL — ABNORMAL LOW (ref >9.9)

## 2020-07-13 ENCOUNTER — Ambulatory Visit (HOSPITAL_COMMUNITY)
Admission: RE | Admit: 2020-07-13 | Discharge: 2020-07-13 | Disposition: A | Discharge status: Home / Self Care | Payer: 59 | Source: Ambulatory Visit | Attending: Gastroenterology | Admitting: Gastroenterology

## 2020-07-13 ENCOUNTER — Other Ambulatory Visit: Payer: Self-pay

## 2020-07-13 ENCOUNTER — Other Ambulatory Visit: Payer: Self-pay | Admitting: Gastroenterology

## 2020-07-13 DIAGNOSIS — R17 Unspecified jaundice: Secondary | ICD-10-CM

## 2020-07-13 DIAGNOSIS — K838 Other specified diseases of biliary tract: Secondary | ICD-10-CM | POA: Insufficient documentation

## 2020-07-13 MED ORDER — GADOBUTROL 1 MMOL/ML IV SOLN
10.0000 mL | Freq: Once | INTRAVENOUS | Status: AC | PRN
  Administered 2020-07-13: 10 mL via INTRAVENOUS

## 2020-07-14 ENCOUNTER — Other Ambulatory Visit: Payer: Self-pay | Admitting: *Deleted

## 2020-07-14 DIAGNOSIS — K760 Fatty (change of) liver, not elsewhere classified: Secondary | ICD-10-CM

## 2020-07-14 DIAGNOSIS — R17 Unspecified jaundice: Secondary | ICD-10-CM

## 2020-07-14 DIAGNOSIS — K838 Other specified diseases of biliary tract: Secondary | ICD-10-CM

## 2020-07-14 DIAGNOSIS — R7989 Other specified abnormal findings of blood chemistry: Secondary | ICD-10-CM

## 2020-07-22 ENCOUNTER — Other Ambulatory Visit: Payer: Self-pay | Admitting: Family

## 2020-07-22 DIAGNOSIS — E119 Type 2 diabetes mellitus without complications: Secondary | ICD-10-CM

## 2020-07-30 ENCOUNTER — Other Ambulatory Visit: Payer: Self-pay | Admitting: Family

## 2020-07-30 DIAGNOSIS — E119 Type 2 diabetes mellitus without complications: Secondary | ICD-10-CM

## 2020-08-24 ENCOUNTER — Other Ambulatory Visit: Payer: Self-pay | Admitting: Family

## 2020-08-24 DIAGNOSIS — E119 Type 2 diabetes mellitus without complications: Secondary | ICD-10-CM

## 2020-09-01 ENCOUNTER — Other Ambulatory Visit: Payer: Self-pay

## 2020-09-01 ENCOUNTER — Ambulatory Visit (INDEPENDENT_AMBULATORY_CARE_PROVIDER_SITE_OTHER): Payer: 59 | Admitting: Family

## 2020-09-01 ENCOUNTER — Encounter: Payer: Self-pay | Admitting: Family

## 2020-09-01 VITALS — BP 130/89 | HR 94 | Temp 97.2°F | Ht 79.0 in | Wt 327.4 lb

## 2020-09-01 DIAGNOSIS — Z23 Encounter for immunization: Secondary | ICD-10-CM

## 2020-09-01 DIAGNOSIS — E1159 Type 2 diabetes mellitus with other circulatory complications: Secondary | ICD-10-CM | POA: Diagnosis not present

## 2020-09-01 DIAGNOSIS — K760 Fatty (change of) liver, not elsewhere classified: Secondary | ICD-10-CM

## 2020-09-01 DIAGNOSIS — I152 Hypertension secondary to endocrine disorders: Secondary | ICD-10-CM

## 2020-09-01 DIAGNOSIS — E1169 Type 2 diabetes mellitus with other specified complication: Secondary | ICD-10-CM

## 2020-09-01 DIAGNOSIS — K219 Gastro-esophageal reflux disease without esophagitis: Secondary | ICD-10-CM | POA: Diagnosis not present

## 2020-09-01 DIAGNOSIS — Z114 Encounter for screening for human immunodeficiency virus [HIV]: Secondary | ICD-10-CM

## 2020-09-01 DIAGNOSIS — E785 Hyperlipidemia, unspecified: Secondary | ICD-10-CM

## 2020-09-01 DIAGNOSIS — E119 Type 2 diabetes mellitus without complications: Secondary | ICD-10-CM

## 2020-09-01 NOTE — Patient Instructions (Signed)
Fatty Liver Disease  Fatty liver disease occurs when too much fat has built up in your liver cells. Fatty liver disease is also called hepatic steatosis or steatohepatitis. The liver removes harmful substances from your bloodstream and produces fluids that your body needs. It also helps your body use and store energy from the food you eat. In many cases, fatty liver disease does not cause symptoms or problems. It is often diagnosed when tests are being done for other reasons. However, over time, fatty liver can cause inflammation that may lead to more serious liver problems, such as scarring of the liver (cirrhosis) and liver failure. Fatty liver is associated with insulin resistance, increased body fat, high blood pressure (hypertension), and high cholesterol. These are features of metabolic syndrome and increase your risk for stroke, diabetes, and heart disease. What are the causes? This condition may be caused by:  Drinking too much alcohol.  Poor nutrition.  Obesity.  Cushing's syndrome.  Diabetes.  High cholesterol.  Certain drugs.  Poisons.  Some viral infections.  Pregnancy. What increases the risk? You are more likely to develop this condition if you:  Abuse alcohol.  Are overweight.  Have diabetes.  Have hepatitis.  Have a high triglyceride level.  Are pregnant. What are the signs or symptoms? Fatty liver disease often does not cause symptoms. If symptoms do develop, they can include:  Fatigue.  Weakness.  Weight loss.  Confusion.  Abdominal pain.  Nausea and vomiting.  Yellowing of your skin and the white parts of your eyes (jaundice).  Itchy skin. How is this diagnosed? This condition may be diagnosed by:  A physical exam and medical history.  Blood tests.  Imaging tests, such as an ultrasound, CT scan, or MRI.  A liver biopsy. A small sample of liver tissue is removed using a needle. The sample is then looked at under a microscope. How  is this treated? Fatty liver disease is often caused by other health conditions. Treatment for fatty liver may involve medicines and lifestyle changes to manage conditions such as:  Alcoholism.  High cholesterol.  Diabetes.  Being overweight or obese. Follow these instructions at home:   Do not drink alcohol. If you have trouble quitting, ask your health care provider how to safely quit with the help of medicine or a supervised program. This is important to keep your condition from getting worse.  Eat a healthy diet as told by your health care provider. Ask your health care provider about working with a diet and nutrition specialist (dietitian) to develop an eating plan.  Exercise regularly. This can help you lose weight and control your cholesterol and diabetes. Talk to your health care provider about an exercise plan and which activities are best for you.  Take over-the-counter and prescription medicines only as told by your health care provider.  Keep all follow-up visits as told by your health care provider. This is important. Contact a health care provider if: You have trouble controlling your:  Blood sugar. This is especially important if you have diabetes.  Cholesterol.  Drinking of alcohol. Get help right away if:  You have abdominal pain.  You have jaundice.  You have nausea and vomiting.  You vomit blood or material that looks like coffee grounds.  You have stools that are black, tar-like, or bloody. Summary  Fatty liver disease develops when too much fat builds up in the cells of your liver.  Fatty liver disease often causes no symptoms or problems. However, over   time, fatty liver can cause inflammation that may lead to more serious liver problems, such as scarring of the liver (cirrhosis).  You are more likely to develop this condition if you abuse alcohol, are pregnant, are overweight, have diabetes, have hepatitis, or have high triglyceride  levels.  Contact your health care provider if you have trouble controlling your weight, blood sugar, cholesterol, or drinking of alcohol. This information is not intended to replace advice given to you by your health care provider. Make sure you discuss any questions you have with your health care provider. Document Revised: 09/13/2017 Document Reviewed: 07/10/2017 Elsevier Patient Education  2020 [employer]  

## 2020-09-01 NOTE — Progress Notes (Signed)
Subjective:    Patient ID: Derek Decker, male    DOB: [birth year], 34 y.o.   MRN: [id number]  Chief Complaint  Patient presents with  . Diabetes    fasting, no concerns   . Hypertension   Pt presents to the office today for chronic follow up. Pt is followed by GI for hepatic stenosis.  Diabetes He presents for his follow-up diabetic visit. He has type 2 diabetes mellitus. His disease course has been stable. There are no hypoglycemic associated symptoms. Pertinent negatives for diabetes include no blurred vision and no foot paresthesias. Symptoms are stable. Pertinent negatives for diabetic complications include no CVA, heart disease, nephropathy or peripheral neuropathy. Risk factors for coronary artery disease include diabetes mellitus. He is following a generally healthy diet. His overall blood glucose range is 110-130 mg/dl. An ACE inhibitor/angiotensin II receptor blocker is being taken. Eye exam is not current.  Hypertension This is a chronic problem. The current episode started more than 1 year ago. The problem has been resolved since onset. The problem is controlled. Pertinent negatives include no blurred vision, malaise/fatigue or peripheral edema. Risk factors for coronary artery disease include dyslipidemia, diabetes mellitus, obesity and male gender. The current treatment provides moderate improvement. There is no history of CVA.  Hyperlipidemia This is a chronic problem. The current episode started more than 1 year ago.      Review of Systems  Constitutional: Negative for malaise/fatigue.  Eyes: Negative for blurred vision.  All other systems reviewed and are negative.      Objective:   Physical Exam Vitals reviewed.  Constitutional:      General: He is not in acute distress.    Appearance: He is well-developed.  HENT:     Head: Normocephalic.     Right Ear: Tympanic membrane normal.     Left Ear: Tympanic membrane normal.  Eyes:     General:        Right eye: No  discharge.        Left eye: No discharge.     Pupils: Pupils are equal, round, and reactive to light.  Neck:     Thyroid: No thyromegaly.  Cardiovascular:     Rate and Rhythm: Normal rate and regular rhythm.     Heart sounds: Normal heart sounds. No murmur heard.   Pulmonary:     Effort: Pulmonary effort is normal. No respiratory distress.     Breath sounds: Normal breath sounds. No wheezing.  Abdominal:     General: Bowel sounds are normal. There is no distension.     Palpations: Abdomen is soft.     Tenderness: There is no abdominal tenderness.  Musculoskeletal:        General: No tenderness. Normal range of motion.     Cervical back: Normal range of motion and neck supple.  Skin:    General: Skin is warm and dry.     Findings: No erythema or rash.  Neurological:     Mental Status: He is alert and oriented to person, place, and time.     Cranial Nerves: No cranial nerve deficit.     Deep Tendon Reflexes: Reflexes are normal and symmetric.  Psychiatric:        Behavior: Behavior normal.        Thought Content: Thought content normal.        Judgment: Judgment normal.    Diabetic Foot Exam - Simple   Simple Foot Form Diabetic Foot exam was performed with  the following findings: Yes 09/01/2020 10:52 AM  Visual Inspection See comments: Yes Sensation Testing Intact to touch and monofilament testing bilaterally: Yes Pulse Check Posterior Tibialis and Dorsalis pulse intact bilaterally: Yes Comments Right toes hammer toe       BP 130/89   Pulse 94   Temp (!) 97.2 F (36.2 C) (Temporal)   Ht '6\' 7"'  (2.007 m)   Wt (!) 327 lb 6.4 oz (148.5 kg)   SpO2 97%   BMI 36.88 kg/m      Assessment & Plan:  Derek Decker comes in today with chief complaint of Diabetes (fasting, no concerns ) and Hypertension   Diagnosis and orders addressed:  1. Hypertension associated with type 2 diabetes mellitus (HCC) - BMP8+EGFR - CBC with Differential/Platelet  2. Gastroesophageal  reflux disease, unspecified whether esophagitis present - BMP8+EGFR - CBC with Differential/Platelet  3. Hyperlipidemia associated with type 2 diabetes mellitus (HCC) - BMP8+EGFR - CBC with Differential/Platelet  4. Type 2 diabetes mellitus without complication, without long-term current use of insulin (HCC) - BMP8+EGFR - CBC with Differential/Platelet - Microalbumin / creatinine urine ratio  5. Morbid obesity (HCC) - BMP8+EGFR - CBC with Differential/Platelet  6. Hepatic steatosis - BMP8+EGFR - CBC with Differential/Platelet  7. Encounter for screening for HIV - BMP8+EGFR - CBC with Differential/Platelet - HIV Antibody (routine testing w rflx)  8. Need for immunization against influenza - Flu Vaccine QUAD 36+ mos IM   Labs pending Health Maintenance reviewed Diet and exercise encouraged  Follow up plan: 6 months    Evelina Dun, FNP

## 2020-09-02 LAB — CBC WITH DIFFERENTIAL/PLATELET
Basophils Absolute: 0.1 10*3/uL (ref 0.0–0.2)
Basos: 1 %
EOS (ABSOLUTE): 0.4 10*3/uL (ref 0.0–0.4)
Eos: 5 %
Hematocrit: 49.1 % (ref 37.5–51.0)
Hemoglobin: 16.2 g/dL (ref 13.0–17.7)
Immature Grans (Abs): 0 10*3/uL (ref 0.0–0.1)
Immature Granulocytes: 0 %
Lymphocytes Absolute: 2.4 10*3/uL (ref 0.7–3.1)
Lymphs: 33 %
MCH: 29.2 pg (ref 26.6–33.0)
MCHC: 33 g/dL (ref 31.5–35.7)
MCV: 89 fL (ref 79–97)
Monocytes Absolute: 0.4 10*3/uL (ref 0.1–0.9)
Monocytes: 6 %
Neutrophils Absolute: 4 10*3/uL (ref 1.4–7.0)
Neutrophils: 55 %
Platelets: 286 10*3/uL (ref 150–450)
RBC: 5.55 x10E6/uL (ref 4.14–5.80)
RDW: 11.8 % (ref 11.6–15.4)
WBC: 7.3 10*3/uL (ref 3.4–10.8)

## 2020-09-02 LAB — BMP8+EGFR
BUN/Creatinine Ratio: 12 (ref 9–20)
BUN: 12 mg/dL (ref 6–20)
CO2: 24 mmol/L (ref 20–29)
Calcium: 9.9 mg/dL (ref 8.7–10.2)
Chloride: 100 mmol/L (ref 96–106)
Creatinine, Ser: 1.01 mg/dL (ref 0.76–1.27)
GFR calc Af Amer: 111 mL/min/{1.73_m2} (ref >59)
GFR calc non Af Amer: 96 mL/min/{1.73_m2} (ref >59)
Glucose: 148 mg/dL — ABNORMAL HIGH (ref 65–99)
Potassium: 4.9 mmol/L (ref 3.5–5.2)
Sodium: 138 mmol/L (ref 134–144)

## 2020-09-02 LAB — MICROALBUMIN / CREATININE URINE RATIO
Creatinine, Urine: 103.5 mg/dL
Microalb/Creat Ratio: 22 mg/g creat (ref 0–29)
Microalbumin, Urine: 23 ug/mL

## 2020-09-02 LAB — HIV ANTIBODY (ROUTINE TESTING W REFLEX): HIV Screen 4th Generation wRfx: NONREACTIVE

## 2020-09-28 ENCOUNTER — Other Ambulatory Visit: Payer: Self-pay | Admitting: Family

## 2020-09-28 DIAGNOSIS — E119 Type 2 diabetes mellitus without complications: Secondary | ICD-10-CM

## 2020-10-31 ENCOUNTER — Ambulatory Visit: Payer: 59 | Admitting: Gastroenterology

## 2020-11-13 ENCOUNTER — Other Ambulatory Visit: Payer: Self-pay | Admitting: Family

## 2020-11-13 DIAGNOSIS — E119 Type 2 diabetes mellitus without complications: Secondary | ICD-10-CM

## 2020-12-05 NOTE — Progress Notes (Signed)
Referring Provider: Sharion Balloon, FNP Primary Care Physician:  Sharion Balloon, FNP Primary GI Physician: Dr. Abbey Chatters  Chief Complaint  Patient presents with  . elevated LFT's    F/u  . Gastroesophageal Reflux    No problem    HPI:   Derek Decker is a 35 y.o. male presenting today for follow-up of elevated LFTs, hepatic steatosis, and GERD.  Last seen in our office at the time of initial consult on 06/27/2020.  Prior CT A/P in December 2019 with hepatic steatosis.  Labs in June 2021 with ALT elevated at 52, platelets normal, hep C negative.  Per chart review, appeared AST and ALT had been mildly elevated since September 2020.  Patient denies history of heavy alcohol use and last drink about 1 year ago.  No history of drug use, no exposure to hepatitis, tattoos, or family history of liver disease.  No signs or symptoms of decompensated liver disease.  No Tylenol or NSAIDs.  Started Lipitor in June 2021.  Noted GERD symptoms 3-4 times a week for years taking Tums.  Noted loose stools since starting Metformin with about four BMs per day.  Suspected mild elevated LFTs likely secondary to fatty liver.  Plan to update HFP, hep B serologies, iron panel, ultrasound abdomen, and counseled on fatty liver.  Also counseled on GERD diet/lifestyle.  Consider PPI at follow-up office visit if ongoing GERD symptoms.  Follow-up in 4 months.  Labs 07/04/2020: ALT elevated at 57, AST and alk phos normal, total bilirubin elevated at 1.7 with indirect bilirubin 1.5.  Iron panel within normal limits.  No Hep B, insufficient immunity.  Ultrasound with evidence of fatty liver and borderline enlarged CBD at 0.7 cm.  Plan for MRI/MRCP for further evaluation.  MRI/MRCP 07/13/2020: Hepatic steatosis, no biliary ductal dilation and caliber of CBD is no greater than 4 mm.  Exam was somewhat limited due to breath motion artifact.  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  Per chart review, bilirubin has been mildly elevated in the past as well.  Recommended repeat HFP in 4 weeks.  HFP not completed.  Today:  Elevated LFTs/fatty liver: Gained 5 lbs since last visit.  Not sure what his last A1c was. Sugars range 140-150 routinely.  He is not taking Invokana at this time due to insurance issues.  No signs or symptoms of decompensated liver disease.  No alcohol or drug use.  No Tylenol, over-the-counter supplements, or herbal teas.  States he is trying to limit fattier items and eat more at home, but he does continue to eat red meats and fried/fatty/greasy foods fairly frequently.  Discussed the possibility of referral to dietitian, and patient would like this.  GERD: Continues with reflux symptoms at least twice weekly, sometimes more.  Not triggered by any particular food.  He does continue to drink soda daily, and eat fried/fatty foods fairly frequently.  No spicy foods. No dysphagia. No nausea or vomiting. No abdominal pain.   Stools are loose to watery every other day with 2-3 BMs daily. Very rare nocturnal BMs. Started with metformin. Prior to this, he had 2-3 formed BMs daily.  States he mentioned this to his PCP, but they did not offer changing the medication. Uses imodium as needed. No brbpr or melena.    Past Medical History:  Diagnosis Date  . Diabetes mellitus without complication ([place])   . Fatty liver   . HLD (hyperlipidemia)   .  Hypertension     Past Surgical History:  Procedure Laterality Date  . FRACTURE SURGERY Left 2016   left foot    Current Outpatient Medications  Medication Sig Dispense Refill  . atorvastatin (LIPITOR) 20 MG tablet Take 1 tablet (20 mg total) by mouth daily. 90 tablet 3  . Continuous Blood Gluc Receiver (DEXCOM G6 RECEIVER) DEVI Use to check Blood glucose up to 4 times daily 1 each 1  . Continuous Blood Gluc Sensor (DEXCOM G6 SENSOR) MISC Use to check Blood glucose up to 4 times daily 90 each 9  .  Continuous Blood Gluc Transmit (DEXCOM G6 TRANSMITTER) MISC Use to check Blood glucose up to 4 times daily 1 each 4  . glucose blood (ONETOUCH VERIO) test strip USE 1 STRIP TO CHECK GLUCOSE four times a day 100 each 2  . lisinopril (ZESTRIL) 20 MG tablet Take 1 tablet (20 mg total) by mouth daily. 90 tablet 3  . metFORMIN (GLUCOPHAGE) 500 MG tablet TAKE 1 TABLET BY MOUTH TWICE A DAY 60 tablet 3  . omeprazole (PRILOSEC) 20 MG capsule Take 1 capsule (20 mg total) by mouth daily before breakfast. 30 capsule 3  . Semaglutide,0.25 or 0.5MG /DOS, (OZEMPIC, 0.25 OR 0.5 MG/DOSE,) 2 MG/1.5ML SOPN Inject 0.5 mg into the skin once a week. 4.5 mL 1  . canagliflozin (INVOKANA) 300 MG TABS tablet Take 1 tablet (300 mg total) by mouth daily before breakfast. (Patient not taking: Reported on 12/07/2020) 90 tablet 2   No current facility-administered medications for this visit.    Allergies as of 12/07/2020  . (No Known Allergies)    Family History  Problem Relation Age of Onset  . Hypertension Mother   . Hypertension Father   . Liver disease Neg Hx   . Colon cancer Neg Hx     Social History   Socioeconomic History  . Marital status: Single    Spouse name: Not on file  . Number of children: Not on file  . Years of education: Not on file  . Highest education level: Not on file  Occupational History  . Not on file  Tobacco Use  . Smoking status: Former Smoker    Packs/day: 0.25    Years: 3.00    Pack years: 0.75  . Smokeless tobacco: Never Used  Vaping Use  . Vaping Use: Never used  Substance and Sexual Activity  . Alcohol use: Not Currently  . Drug use: No  . Sexual activity: Not on file  Other Topics Concern  . Not on file  Social History Narrative  . Not on file   Social Determinants of Health   Financial Resource Strain: Not on file  Food Insecurity: Not on file  Transportation Needs: Not on file  Physical Activity: Not on file  Stress: Not on file  Social Connections: Not on  file    Review of Systems: Gen: Denies fever, chills, cold or flulike symptoms, presyncope, syncope.  CV: Denies chest pain or palpitations. Resp: Denies dyspnea or cough GI: See HPI Heme: See HPI  Physical Exam: BP (!) 142/87   Pulse 90   Temp (!) 97.5 F (36.4 C)   Ht 6\' 7"  (2.007 m)   Wt (!) 334 lb 12.8 oz (151.9 kg)   BMI 37.72 kg/m  General:   Alert and oriented. No distress noted. Pleasant and cooperative.  Head:  Normocephalic and atraumatic. Eyes:  Conjuctiva clear without scleral icterus. Heart:  S1, S2 present without murmurs appreciated. Lungs:  Clear  to auscultation bilaterally. No wheezes, rales, or rhonchi. No distress.  Abdomen:  +BS, soft, non-tender and non-distended. No rebound or guarding. No HSM or masses noted. Msk:  Symmetrical without gross deformities. Normal posture. Extremities:  Without edema. Neurologic:  Alert and  oriented x4 Psych: Normal mood and affect.

## 2020-12-07 ENCOUNTER — Telehealth (INDEPENDENT_AMBULATORY_CARE_PROVIDER_SITE_OTHER): Payer: 59 | Admitting: Gastroenterology

## 2020-12-07 ENCOUNTER — Encounter: Payer: Self-pay | Admitting: Gastroenterology

## 2020-12-07 ENCOUNTER — Other Ambulatory Visit: Payer: Self-pay | Admitting: *Deleted

## 2020-12-07 ENCOUNTER — Other Ambulatory Visit: Payer: Self-pay

## 2020-12-07 VITALS — BP 142/87 | HR 90 | Temp 97.5°F | Ht 79.0 in | Wt 334.8 lb

## 2020-12-07 DIAGNOSIS — K760 Fatty (change of) liver, not elsewhere classified: Secondary | ICD-10-CM

## 2020-12-07 DIAGNOSIS — R7989 Other specified abnormal findings of blood chemistry: Secondary | ICD-10-CM | POA: Diagnosis not present

## 2020-12-07 DIAGNOSIS — K219 Gastro-esophageal reflux disease without esophagitis: Secondary | ICD-10-CM

## 2020-12-07 DIAGNOSIS — R197 Diarrhea, unspecified: Secondary | ICD-10-CM | POA: Insufficient documentation

## 2020-12-07 DIAGNOSIS — E119 Type 2 diabetes mellitus without complications: Secondary | ICD-10-CM

## 2020-12-07 MED ORDER — OMEPRAZOLE 20 MG PO CPDR: 20.0000 mg | DELAYED_RELEASE_CAPSULE | Freq: Every day | ORAL | 3 refills | Status: DC

## 2020-12-07 NOTE — Assessment & Plan Note (Signed)
Addressed under elevated LFTs. 

## 2020-12-07 NOTE — Progress Notes (Signed)
Cc'ed to pcp °

## 2020-12-07 NOTE — Patient Instructions (Addendum)
Please have labs completed with [facility].   For elevated LFTs and fatty liver: 1.  As we discussed, I suspect your elevated liver function test are secondary to fatty liver disease.  Is very important to work on weight loss through diet and exercise.  See instructions below. 2.  We are also referring you to a dietitian to help with weight loss. 3.  Instructions for fatty liver:  Recommend 1-2# weight loss per week until ideal body weight through exercise & diet.  Low fat/cholesterol diet.    Avoid sweets, sodas, fruit juices, sweetened beverages like tea, etc.  Gradually increase exercise from 15 min daily up to 1 hr per day 5 days/week.  4. Continue to avoid alcohol. 5.  Continue to avoid Tylenol. 6.  Continue to avoid over-the-counter supplements and herbal teas.    For acid reflux: 1.  Start omeprazole 20 mg daily 30 minutes before breakfast. 2.  Follow a GERD diet:   Avoid fried, fatty, greasy, spicy, citrus foods.  Avoid caffeine and carbonated beverages.  Avoid chocolate.  Try eating 4-6 small meals a day rather than 3 large meals.  Do not eat within 3 hours of laying down.  Prop head of bed up on wood or bricks to create a 6 inch incline.  For loose stools:  1.  I suspect this is secondary to Metformin.  I will reach out to PCP to see if she can talk with you about different formulation or other medication. 2.  We are evaluating you for celiac disease (allergy to gluten) and thyroid abnormalities. 3.  Follow a low-fat diet.   4.  Try to avoid dairy products and or take Lactaid tablets prior to dairy consumption.  We will see you back in 3 months for follow-up.   Ermalinda Memos, PA-C [facility]     Nonalcoholic Fatty Liver Disease Diet, Adult Nonalcoholic fatty liver disease is a condition that causes fat to build up in and around the liver. The disease makes it harder for the liver to work the way that it should. Following a healthy diet can help to keep  nonalcoholic fatty liver disease under control. It can also help to prevent or improve conditions that are associated with the disease, such as heart disease, diabetes, high blood pressure, and abnormal cholesterol levels. Along with regular exercise, this diet:  Promotes weight loss.  Helps to control blood sugar levels.  Helps to improve the way that the body uses insulin. What are tips for following this plan? Reading food labels Always check food labels for:  The amount of saturated fat in a food. You should limit your intake of saturated fat. Saturated fat is found in foods that come from animals, including meat and dairy products such as butter, cheese, and whole milk.  The amount of fiber in a food. You should choose high-fiber foods such as fruits, vegetables, and whole grains. Try to get 25-30 grams (g) of fiber a day.   Cooking  When cooking, use heart-healthy oils that are high in monounsaturated fats. These include olive oil, canola oil, and avocado oil.  Limit frying or deep-frying foods. Cook foods using healthy methods such as baking, boiling, steaming, and grilling instead. Meal planning  You may want to keep track of how many calories you take in. Eating the right amount of calories will help you achieve a healthy weight. Meeting with a registered dietitian can help you get started.  Limit how often you eat takeout and fast  food. These foods are usually very high in fat, salt, and sugar.  Use the glycemic index (GI) to plan your meals. The index tells you how quickly a food will raise your blood sugar. Choose low-GI foods (GI less than 55). These foods take a longer time to raise blood sugar. A registered dietitian can help you identify foods lower on the GI scale. Lifestyle  You may want to follow a Mediterranean diet. This diet includes a lot of vegetables, lean meats or fish, whole grains, fruits, and healthy oils and fats. What foods can I eat? Fruits Bananas.  Apples. Oranges. Grapes. Papaya. Mango. Pomegranate. Kiwi. Grapefruit. Cherries. Vegetables Lettuce. Spinach. Peas. Beets. Cauliflower. Cabbage. Broccoli. Carrots. Tomatoes. Squash. Eggplant. Herbs. Peppers. Onions. Cucumbers. Brussels sprouts. Yams and sweet potatoes. Beans. Lentils. Grains Whole wheat or whole-grain foods, including breads, crackers, cereals, and pasta. Stone-ground whole wheat. Unsweetened oatmeal. Bulgur. Barley. Quinoa. Brown or wild rice. Corn or whole wheat flour tortillas. Meats and other proteins Lean meats. Poultry. Tofu. Seafood and shellfish. Dairy Low-fat or fat-free dairy products, such as yogurt, cottage cheese, or cheese. Beverages Water. Sugar-free drinks. Tea. Coffee. Low-fat or skim milk. Milk alternatives, such as soy or almond milk. Real fruit juice. Fats and oils Avocado. Canola or olive oil. Nuts and nut butters. Seeds. Seasonings and condiments Mustard. Relish. Low-fat, low-sugar ketchup and barbecue sauce. Low-fat or fat-free mayonnaise. Sweets and desserts Sugar-free sweets. The items listed above may not be a complete list of foods and beverages you can eat. Contact a dietitian for more information.   What foods should I limit or avoid? Meats and other proteins Limit red meat to 1-2 times a week. Dairy MicrosoftFull-fat dairy. Fats and oils Palm oil and coconut oil. Fried foods. Other foods Processed foods. Foods that contain a lot of salt or sodium. Sweets and desserts Sweets that contain sugar. Beverages Sweetened drinks, such as sweet tea, milkshakes, iced sweet drinks, and sodas. Alcohol. The items listed above may not be a complete list of foods and beverages you should avoid. Contact a dietitian for more information. Where to find more information The [employer] of Diabetes and Digestive and Kidney Diseases: StageSync.siniddk.nih.gov Summary  Nonalcoholic fatty liver disease is a condition that causes fat to build up in and around the  liver.  Following a healthy diet can help to keep nonalcoholic fatty liver disease under control. Your diet should be rich in fruits, vegetables, whole grains, and lean proteins.  Limit your intake of saturated fat. Saturated fat is found in foods that come from animals, including meat and dairy products such as butter, cheese, and whole milk.  This diet promotes weight loss, helps to control blood sugar levels, and helps to improve the way that the body uses insulin. This information is not intended to replace advice given to you by your health care provider. Make sure you discuss any questions you have with your health care provider. Document Revised: 01/23/2019 Document Reviewed: 10/23/2018 Elsevier Patient Education  2021 Elsevier Inc.  Food Choices for Gastroesophageal Reflux Disease, Adult When you have gastroesophageal reflux disease (GERD), the foods you eat and your eating habits are very important. Choosing the right foods can help ease your discomfort. Think about working with a food expert (dietitian) to help you make good choices. What are tips for following this plan? Reading food labels  Look for foods that are low in saturated fat. Foods that may help with your symptoms include: ? Foods that have less than 5%  of daily value (DV) of fat. ? Foods that have 0 grams of trans fat. Cooking  Do not fry your food.  Cook your food by baking, steaming, grilling, or broiling. These are all methods that do not need a lot of fat for cooking.  To add flavor, try to use herbs that are low in spice and acidity. Meal planning  Choose healthy foods that are low in fat, such as: ? Fruits and vegetables. ? Whole grains. ? Low-fat dairy products. ? Lean meats, fish, and poultry.  Eat small meals often instead of eating 3 large meals each day. Eat your meals slowly in a place where you are relaxed. Avoid bending over or lying down until 2-3 hours after eating.  Limit high-fat foods  such as fatty meats or fried foods.  Limit your intake of fatty foods, such as oils, butter, and shortening.  Avoid the following as told by your doctor: ? Foods that cause symptoms. These may be different for different people. Keep a food diary to keep track of foods that cause symptoms. ? Alcohol. ? Drinking a lot of liquid with meals. ? Eating meals during the 2-3 hours before bed.   Lifestyle  Stay at a healthy weight. Ask your doctor what weight is healthy for you. If you need to lose weight, work with your doctor to do so safely.  Exercise for at least 30 minutes on 5 or more days each week, or as told by your doctor.  Wear loose-fitting clothes.  Do not smoke or use any products that contain nicotine or tobacco. If you need help quitting, ask your doctor.  Sleep with the head of your bed higher than your feet. Use a wedge under the mattress or blocks under the bed frame to raise the head of the bed.  Chew sugar-free gum after meals. What foods should eat? Eat a healthy, well-balanced diet of fruits, vegetables, whole grains, low-fat dairy products, lean meats, fish, and poultry. Each person is different. Foods that may cause symptoms in one person may not cause any symptoms in another person. Work with your doctor to find foods that are safe for you. The items listed above may not be a complete list of what you can eat and drink. Contact a food expert for more options.   What foods should I avoid? Limiting some of these foods may help in managing the symptoms of GERD. Everyone is different. Talk with a food expert or your doctor to help you find the exact foods to avoid, if any. Fruits Any fruits prepared with added fat. Any fruits that cause symptoms. For some people, this may include citrus fruits, such as oranges, grapefruit, pineapple, and lemons. Vegetables Deep-fried vegetables. [place] fries. Any vegetables prepared with added fat. Any vegetables that cause symptoms. For  some people, this may include tomatoes and tomato products, chili peppers, onions and garlic, and horseradish. Grains Pastries or quick breads with added fat. Meats and other proteins High-fat meats, such as fatty beef or pork, hot dogs, ribs, ham, sausage, salami, and bacon. Fried meat or protein, including fried fish and fried chicken. Nuts and nut butters, in large amounts. Dairy Whole milk and chocolate milk. Sour cream. Cream. Ice cream. Cream cheese. Milkshakes. Fats and oils Butter. Margarine. Shortening. Ghee. Beverages Coffee and tea, with or without caffeine. Carbonated beverages. Sodas. Energy drinks. Fruit juice made with acidic fruits, such as orange or grapefruit. Tomato juice. Alcoholic drinks. Sweets and desserts Chocolate and cocoa. Donuts. Seasonings  and condiments Pepper. Peppermint and spearmint. Added salt. Any condiments, herbs, or seasonings that cause symptoms. For some people, this may include curry, hot sauce, or vinegar-based salad dressings. The items listed above may not be a complete list of what you should not eat and drink. Contact a food expert for more options. Questions to ask your doctor Diet and lifestyle changes are often the first steps that are taken to manage symptoms of GERD. If diet and lifestyle changes do not help, talk with your doctor about taking medicines. Where to find more information  International Foundation for Gastrointestinal Disorders: aboutgerd.org Summary  When you have GERD, food and lifestyle choices are very important in easing your symptoms.  Eat small meals often instead of 3 large meals a day. Eat your meals slowly and in a place where you are relaxed.  Avoid bending over or lying down until 2-3 hours after eating.  Limit high-fat foods such as fatty meats or fried foods. This information is not intended to replace advice given to you by your health care provider. Make sure you discuss any questions you have with your  health care provider. Document Revised: 04/11/2020 Document Reviewed: 04/11/2020 Elsevier Patient Education  2021 [employer].

## 2020-12-07 NOTE — Assessment & Plan Note (Signed)
35 year old male with several year history of fairly frequent GERD symptoms.  No alarm symptoms.  I suspect dietary habits and body habitus are likely contributing with BMI 37.  At his last office visit in September 2021, he was counseled on GERD diet/lifestyle.  He has made few changes and continues with GERD symptoms at least twice a week for which he is taking Tums.   Plan: 1.  Start omeprazole 20 mg daily 30 minutes before breakfast. 2.  Counseled on the importance of following a GERD diet/lifestyle.  Separate written instructions and GERD handout was provided. 3.  Follow-up in 3 months.

## 2020-12-07 NOTE — Assessment & Plan Note (Addendum)
Patient reports 2-3 loose to watery BMs daily to every other day with very rare nocturnal BMs that started after he was placed on Metformin for diabetes.  Prior to this, he had 2-3 formed BMs daily.  No BRBPR, melena, or associated abdominal pain.  Uses Imodium as needed.   Suspect diarrhea is likely med effect in the setting of Metformin.  We will also evaluate for thyroid abnormalities and celiac disease.  Poor diet may also be influencing symptoms.  Plan:  1.  TSH 2.  TTG IgA and IgA total 3.  Follow a low-fat diet.   4.  Try to avoid dairy products and or take Lactaid tablets prior to dairy consumption. 5.  I will send a message to PCP to see if another formulation of Metformin may be an option. 6.  Follow-up in 3 months.

## 2020-12-07 NOTE — Assessment & Plan Note (Signed)
35 year old male with history of mildly intermittent elevation of AST and ALT since September 2020.  Most recent labs September 2021 with ALT elevated at 57, AST and alk phos normal.  Total bilirubin was elevated at 1.7 with indirect bilirubin at 1.5.  Hepatitis B and C negative, iron panel within normal limits. Abdominal ultrasound with fatty liver, CBD borderline enlarged at 0.7 cm, spleen normal.  Follow-up MRI/MRCP with hepatic steatosis, no biliary ductal dilation, caliber of CBD no greater than 4 mm. Denies alcohol, illicit drug use, Tylenol use, or OTC supplements/herbal teas.   Suspect mild LFT elevation likely secondary to underlying fatty liver in the setting of obesity, diabetes, HTN, and HLD.  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  We again extensively discussed the clinical course of fatty liver with the possibility of progressing to cirrhosis over time along with the importance of weight loss through diet and exercise.  Plan: Update HFP. Refer to dietitian to assist with dietary adjustments and weight loss. Instructions for fatty liver:  Recommend 1-2# weight loss per week until ideal body weight through exercise & diet.  Low fat/cholesterol diet.    Avoid sweets, sodas, fruit juices, sweetened beverages like tea, etc.  Gradually increase exercise from 15 min daily up to 1 hr per day 5 days/week. Avoid alcohol use. Avoid Tylenol. Avoid over-the-counter supplements and herbal teas. Follow-up in 3 months.

## 2020-12-08 ENCOUNTER — Encounter: Payer: Self-pay | Admitting: Family

## 2020-12-08 ENCOUNTER — Ambulatory Visit (INDEPENDENT_AMBULATORY_CARE_PROVIDER_SITE_OTHER): Payer: 59 | Admitting: Family

## 2020-12-08 VITALS — BP 111/71 | HR 95

## 2020-12-08 DIAGNOSIS — E119 Type 2 diabetes mellitus without complications: Secondary | ICD-10-CM | POA: Diagnosis not present

## 2020-12-08 MED ORDER — OZEMPIC (1 MG/DOSE) 2 MG/1.5ML ~~LOC~~ SOPN: 1.0000 mg | PEN_INJECTOR | SUBCUTANEOUS | 6 refills | Status: DC

## 2020-12-08 MED ORDER — METFORMIN HCL ER 500 MG PO TB24: 500.0000 mg | ORAL_TABLET | Freq: Every day | ORAL | 3 refills | Status: DC

## 2020-12-08 NOTE — Progress Notes (Signed)
   Virtual Visit via telephone Note Due to COVID-19 pandemic this visit was conducted virtually. This visit type was conducted due to national recommendations for restrictions regarding the COVID-19 Pandemic (e.g. social distancing, sheltering in place) in an effort to limit this patient's exposure and mitigate transmission in our community. All issues noted in this document were discussed and addressed.  A physical exam was not performed with this format.  I connected with Derek Decker on 12/08/20 at 3:59 pm  by telephone and verified that I am speaking with the correct person using two identifiers. Derek Decker is currently located at home and no one is currently with him  during visit. The provider, Jannifer Rodney, FNP is located in their office at time of visit.  I discussed the limitations, risks, security and privacy concerns of performing an evaluation and management service by telephone and the availability of in person appointments. I also discussed with the patient that there may be a patient responsible charge related to this service. The patient expressed understanding and agreed to proceed.   History and Present Illness:  Diabetes He presents for his follow-up diabetic visit. He has type 2 diabetes mellitus. His disease course has been stable. There are no hypoglycemic associated symptoms. Pertinent negatives for diabetes include no blurred vision and no foot paresthesias. Symptoms are stable. Risk factors for coronary artery disease include dyslipidemia, diabetes mellitus, hypertension and sedentary lifestyle. His overall blood glucose range is 130-140 mg/dl. An ACE inhibitor/angiotensin II receptor blocker is being taken.      Review of Systems  Eyes: Negative for blurred vision.  All other systems reviewed and are negative.    Observations/Objective: No SOB or distress noted   Assessment and Plan: 1. Type 2 diabetes mellitus without complication, without long-term current  use of insulin (HCC) Will change metformin to XR Strict low carb diet Will increase Ozempic to 1 mg from 0.5 mg - metFORMIN (GLUCOPHAGE-XR) 500 MG 24 hr tablet; Take 1 tablet (500 mg total) by mouth daily with breakfast.  Dispense: 90 tablet; Refill: 3 - Semaglutide, 1 MG/DOSE, (OZEMPIC, 1 MG/DOSE,) 2 MG/1.5ML SOPN; Inject 1 mg into the skin once a week.  Dispense: 1.5 mL; Refill: 6    I discussed the assessment and treatment plan with the patient. The patient was provided an opportunity to ask questions and all were answered. The patient agreed with the plan and demonstrated an understanding of the instructions.   The patient was advised to call back or seek an in-person evaluation if the symptoms worsen or if the condition fails to improve as anticipated.  The above assessment and management plan was discussed with the patient. The patient verbalized understanding of and has agreed to the management plan. Patient is aware to call the clinic if symptoms persist or worsen. Patient is aware when to return to the clinic for a follow-up visit. Patient educated on when it is appropriate to go to the emergency department.   Time call ended:  4:11 pm   I provided 12 minutes of non-face-to-face time during this encounter.    Jannifer Rodney, FNP

## 2020-12-11 ENCOUNTER — Other Ambulatory Visit: Payer: Self-pay | Admitting: Family

## 2020-12-11 DIAGNOSIS — I152 Hypertension secondary to endocrine disorders: Secondary | ICD-10-CM

## 2020-12-11 DIAGNOSIS — E1159 Type 2 diabetes mellitus with other circulatory complications: Secondary | ICD-10-CM

## 2020-12-15 LAB — HM DIABETES EYE EXAM

## 2021-01-13 ENCOUNTER — Encounter: Payer: Self-pay | Admitting: Dietician

## 2021-01-13 ENCOUNTER — Encounter: Payer: 59 | Attending: Family | Admitting: Dietician

## 2021-01-13 ENCOUNTER — Other Ambulatory Visit: Payer: Self-pay

## 2021-01-13 DIAGNOSIS — E119 Type 2 diabetes mellitus without complications: Secondary | ICD-10-CM | POA: Diagnosis not present

## 2021-01-13 NOTE — Progress Notes (Signed)
Medical Nutrition Therapy  Appointment Start time:  1050  Appointment End time:  1150  Primary concerns today: Diabetes  Referral diagnosis: R79.89 Elevated LFTs, E66.01 Morbid Obesity, E11.9 Type 2 DM, K76.0 Fatty Liver Preferred learning style: No preference indicated Learning readiness: Ready   NUTRITION ASSESSMENT   Anthropometrics  Ht: 6'7" Wt: 335 lbs Body mass index is 37.74 kg/m.  Clinical Medical Hx: HTN, DM, HTN, Fatty Liver Medications: metformin, ozempic, lisinopril, atorvastatin Labs: FBG - 148, ALT - 57 (high)  Notable Signs/Symptoms: N/A  Lifestyle & Dietary Hx Pt has been diagnosed as diabetic for 2 years, has no previous education. Pt was previously using Dexcom G6, but had to discontinue use due to rising cost with their insurance. Pt is currently checking BG fasting, middle of the day, and sometimes at night. FBG: ~130 CBG: ~145 mid day and in the evening Pt reports history of diarrhea with metformin, was switched to taking only once a day. Pt reports reduced appetite when starting Ozempic, but states that effect has gone away now. Pt reports working two jobs, will walk in between their jobs. [employer] 7-3, 5-8 pm, Mon-Fri for both. 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 Pt reports checking blood pressure at home "190 over something", but pt states it is usually lower. Pt reports only eating three meals a day, and does not snack.    Estimated daily fluid intake: ~100 oz Supplements: N/A Sleep: Mostly sleeps well Stress / self-care: Low Current average weekly physical activity: walks 30 min a day  24-Hr Dietary Recall First Meal: 2 scrambled eggs, 1 slice of toast, 12 oz OJ Snack: none Second Meal: Cheeseburger w lettuce and tomato, fries, sweet tea Snack: none Third Meal: Pepperoni pizza, 3 slices, sweet tea Snack: none Beverages: OJ, sweet tea, water (4 bottles)   NUTRITION DIAGNOSIS  NB-1.1 Food and nutrition-related  knowledge deficit As related to diabetes.  As evidenced by FBG of 148, inability to identify sources of carbohydrates, and excessive consumption of SSB.   NUTRITION INTERVENTION  Nutrition education (E-1) on the following topics:  . Educated patient on the pathophysiology of diabetes. This includes why our bodies need circulating blood sugar, the relationship between insulin and blood sugar, and the results of insulin resistance and/or pancreatic insufficiency on the development of diabetes. Educated patient on factors that contribute to elevation of blood sugars, such as stress, illness, injury,and food choices. Discussed the role that physical activity plays in lowering blood sugar. Educate patient on the three main macronutrients. Protein, fats, and carbohydrates. Discussed how each of these macronutrients affect blood sugar levels, especially carbohydrate, and the importance of eating a consistent amount of carbohydrate throughout the day. Educated patient on carbohydrate counting, 15g of carbohydrate equals one carb choice. Advised patient on the importance of consistently checking their blood sugar, and recognizing how lifestyle and food choices affect those numbers. Educated patient on the benefit of taking metformin on a full stomach after a meal. . Educated patient on the role of SSB on their fatty liver.  Handouts Provided Include   Yellow Meal Plan Card  Balanced Plate  Learning Style & Readiness for Change Teaching method utilized: Visual & Auditory  Demonstrated degree of understanding via: Teach Back  Barriers to learning/adherence to lifestyle change: none  Goals Established by Pt  Take your metformin AFTER your breakfast with a full stomach.  Start having half and half sweet tea as of today. As you become comfortable with that, begin  to have unsweetened tea with splenda. As you become comfortable with that, work away from sweetening your beverages entirely.  Work towards  eating three meals a day, about 5-6 hours apart!  Begin to recognize carbohydrates in your food choices!  Have 4 carb choices at each meal (60 g).   Begin to build your meals using the proportions of the Balanced Plate.  First, select your carb choice(s) for the meal, and determine how much you should have to equal 4 carb choices (60 g).  Next, select your source of protein to pair with your carb choice(s).  Finally, complete the remaining half of your meal with a variety of non-starchy vegetables.   MONITORING & EVALUATION Dietary intake, weekly physical activity, and blood glucose in 2 months.  Next Steps  Patient is to follow up with dietitian.

## 2021-01-13 NOTE — Patient Instructions (Addendum)
Take your metformin AFTER your breakfast with a full stomach.  Start having half and half sweet tea as of today. As you become comfortable with that, begin to have unsweetened tea with splenda. As you become comfortable with that, work away from sweetening your beverages entirely.  Work towards eating three meals a day, about 5-6 hours apart!  Begin to recognize carbohydrates in your food choices!  Have 4 carb choices at each meal (60 g).   Begin to build your meals using the proportions of the Balanced Plate. . First, select your carb choice(s) for the meal, and determine how much you should have to equal 4 carb choices (60 g). . Next, select your source of protein to pair with your carb choice(s). . Finally, complete the remaining half of your meal with a variety of non-starchy vegetables.

## 2021-02-09 ENCOUNTER — Other Ambulatory Visit: Payer: Self-pay | Admitting: Family

## 2021-02-09 DIAGNOSIS — E1159 Type 2 diabetes mellitus with other circulatory complications: Secondary | ICD-10-CM

## 2021-02-09 DIAGNOSIS — I152 Hypertension secondary to endocrine disorders: Secondary | ICD-10-CM

## 2021-02-22 ENCOUNTER — Encounter: Payer: Self-pay | Admitting: Family Medicine

## 2021-03-02 ENCOUNTER — Encounter: Payer: Self-pay | Admitting: Family

## 2021-03-02 ENCOUNTER — Ambulatory Visit (INDEPENDENT_AMBULATORY_CARE_PROVIDER_SITE_OTHER): Payer: 59 | Admitting: Family

## 2021-03-02 ENCOUNTER — Other Ambulatory Visit: Payer: Self-pay

## 2021-03-02 VITALS — BP 132/81 | HR 89 | Temp 97.8°F | Ht 79.0 in | Wt 326.8 lb

## 2021-03-02 DIAGNOSIS — E1159 Type 2 diabetes mellitus with other circulatory complications: Secondary | ICD-10-CM

## 2021-03-02 DIAGNOSIS — K219 Gastro-esophageal reflux disease without esophagitis: Secondary | ICD-10-CM | POA: Diagnosis not present

## 2021-03-02 DIAGNOSIS — E1169 Type 2 diabetes mellitus with other specified complication: Secondary | ICD-10-CM

## 2021-03-02 DIAGNOSIS — E785 Hyperlipidemia, unspecified: Secondary | ICD-10-CM

## 2021-03-02 DIAGNOSIS — K760 Fatty (change of) liver, not elsewhere classified: Secondary | ICD-10-CM | POA: Diagnosis not present

## 2021-03-02 DIAGNOSIS — I152 Hypertension secondary to endocrine disorders: Secondary | ICD-10-CM

## 2021-03-02 DIAGNOSIS — E119 Type 2 diabetes mellitus without complications: Secondary | ICD-10-CM

## 2021-03-02 LAB — CBC WITH DIFFERENTIAL/PLATELET
Basophils Absolute: 0 10*3/uL (ref 0.0–0.2)
Basos: 1 %
EOS (ABSOLUTE): 0.4 10*3/uL (ref 0.0–0.4)
Eos: 7 %
Hematocrit: 48.7 % (ref 37.5–51.0)
Hemoglobin: 16.2 g/dL (ref 13.0–17.7)
Immature Grans (Abs): 0 10*3/uL (ref 0.0–0.1)
Immature Granulocytes: 0 %
Lymphocytes Absolute: 2.3 10*3/uL (ref 0.7–3.1)
Lymphs: 37 %
MCH: 29.3 pg (ref 26.6–33.0)
MCHC: 33.3 g/dL (ref 31.5–35.7)
MCV: 88 fL (ref 79–97)
Monocytes Absolute: 0.3 10*3/uL (ref 0.1–0.9)
Monocytes: 5 %
Neutrophils Absolute: 3.1 10*3/uL (ref 1.4–7.0)
Neutrophils: 50 %
Platelets: 223 10*3/uL (ref 150–450)
RBC: 5.52 x10E6/uL (ref 4.14–5.80)
RDW: 11.4 % — ABNORMAL LOW (ref 11.6–15.4)
WBC: 6.2 10*3/uL (ref 3.4–10.8)

## 2021-03-02 LAB — CMP14+EGFR
ALT: 45 IU/L — ABNORMAL HIGH (ref 0–44)
AST: 26 IU/L (ref 0–40)
Albumin/Globulin Ratio: 2 (ref 1.2–2.2)
Albumin: 4.3 g/dL (ref 4.0–5.0)
Alkaline Phosphatase: 83 IU/L (ref 44–121)
BUN/Creatinine Ratio: 12 (ref 9–20)
BUN: 12 mg/dL (ref 6–20)
Bilirubin Total: 1.3 mg/dL — ABNORMAL HIGH (ref 0.0–1.2)
CO2: 22 mmol/L (ref 20–29)
Calcium: 9.2 mg/dL (ref 8.7–10.2)
Chloride: 99 mmol/L (ref 96–106)
Creatinine, Ser: 1.04 mg/dL (ref 0.76–1.27)
Globulin, Total: 2.1 g/dL (ref 1.5–4.5)
Glucose: 264 mg/dL — ABNORMAL HIGH (ref 65–99)
Potassium: 4.6 mmol/L (ref 3.5–5.2)
Sodium: 138 mmol/L (ref 134–144)
Total Protein: 6.4 g/dL (ref 6.0–8.5)
eGFR: 96 mL/min/{1.73_m2} (ref >59)

## 2021-03-02 LAB — BAYER DCA HB A1C WAIVED: HB A1C (BAYER DCA - WAIVED): 8.8 % — ABNORMAL HIGH (ref <7.0)

## 2021-03-02 MED ORDER — ATORVASTATIN CALCIUM 20 MG PO TABS: 20.0000 mg | ORAL_TABLET | Freq: Every day | ORAL | 3 refills | Status: DC

## 2021-03-02 MED ORDER — LISINOPRIL 20 MG PO TABS: 1.0000 | ORAL_TABLET | Freq: Every day | ORAL | 1 refills | Status: DC

## 2021-03-02 MED ORDER — OMEPRAZOLE 20 MG PO CPDR: 20.0000 mg | DELAYED_RELEASE_CAPSULE | Freq: Every day | ORAL | 3 refills | Status: DC

## 2021-03-02 MED ORDER — METFORMIN HCL ER 500 MG PO TB24: 500.0000 mg | ORAL_TABLET | Freq: Every day | ORAL | 3 refills | Status: DC

## 2021-03-02 NOTE — Progress Notes (Signed)
Subjective:    Patient ID: Derek Decker, male    DOB: 06-06-1986, 35 y.o.   MRN: [id number]  Chief Complaint  Patient presents with  . Diabetes  . Hypertension    6 mths , needs glucometer cant afford dexcom or ozempic     Pt presents to the office today for chronic follow up. Her A1C was elevated and we started on Ozempic. However, he states his prescription is $600. He can not afford.   He is followed by GI for fatty liver and followed by dietitian. .  Diabetes He presents for his follow-up diabetic visit. He has type 2 diabetes mellitus. His disease course has been stable. There are no hypoglycemic associated symptoms. Pertinent negatives for diabetes include no blurred vision and no foot paresthesias. Symptoms are stable. Pertinent negatives for diabetic complications include no CVA, heart disease, nephropathy or peripheral neuropathy. Risk factors for coronary artery disease include dyslipidemia, hypertension, male sex and sedentary lifestyle. His weight is stable. He is following a generally healthy diet. His overall blood glucose range is 130-140 mg/dl. An ACE inhibitor/angiotensin II receptor blocker is being taken. Eye exam is current.  Hypertension This is a chronic problem. The current episode started more than 1 year ago. The problem has been resolved since onset. The problem is controlled. Pertinent negatives include no blurred vision, malaise/fatigue, peripheral edema or shortness of breath. Risk factors for coronary artery disease include dyslipidemia, diabetes mellitus, obesity and male gender. The current treatment provides moderate improvement. There is no history of CVA.  Gastroesophageal Reflux He complains of belching and heartburn. This is a chronic problem. The current episode started more than 1 year ago. The problem occurs occasionally. Risk factors include obesity.  Hyperlipidemia Pertinent negatives include no shortness of breath. Risk factors for coronary artery  disease include dyslipidemia, diabetes mellitus, male sex, hypertension and a sedentary lifestyle.      Review of Systems  Constitutional: Negative for malaise/fatigue.  Eyes: Negative for blurred vision.  Respiratory: Negative for shortness of breath.   Gastrointestinal: Positive for heartburn.  All other systems reviewed and are negative.      Objective:   Physical Exam Vitals reviewed.  Constitutional:      General: He is not in acute distress.    Appearance: He is well-developed. He is obese.  HENT:     Head: Normocephalic.     Right Ear: Tympanic membrane normal.     Left Ear: Tympanic membrane normal.  Eyes:     General:        Right eye: No discharge.        Left eye: No discharge.     Pupils: Pupils are equal, round, and reactive to light.  Neck:     Thyroid: No thyromegaly.  Cardiovascular:     Rate and Rhythm: Normal rate and regular rhythm.     Heart sounds: Normal heart sounds. No murmur heard.   Pulmonary:     Effort: Pulmonary effort is normal. No respiratory distress.     Breath sounds: Normal breath sounds. No wheezing.  Abdominal:     General: Bowel sounds are normal. There is no distension.     Palpations: Abdomen is soft.     Tenderness: There is no abdominal tenderness.  Musculoskeletal:        General: No tenderness. Normal range of motion.     Cervical back: Normal range of motion and neck supple.  Skin:    General: Skin is warm and  dry.     Findings: No erythema or rash.  Neurological:     Mental Status: He is alert and oriented to person, place, and time.     Cranial Nerves: No cranial nerve deficit.     Deep Tendon Reflexes: Reflexes are normal and symmetric.  Psychiatric:        Behavior: Behavior normal.        Thought Content: Thought content normal.        Judgment: Judgment normal.          BP 132/81   Pulse 89   Temp 97.8 F (36.6 C) (Temporal)   Ht '6\' 7"'  (2.007 m)   Wt (!) 326 lb 12.8 oz (148.2 kg)   BMI 36.82 kg/m    Assessment & Plan:  Derek Decker comes in today with chief complaint of Diabetes and Hypertension (6 mths , needs glucometer cant afford dexcom or ozempic )   Diagnosis and orders addressed:  1. Gastroesophageal reflux disease, unspecified whether esophagitis present - omeprazole (PRILOSEC) 20 MG capsule; Take 1 capsule (20 mg total) by mouth daily before breakfast.  Dispense: 30 capsule; Refill: 3 - CMP14+EGFR - CBC with Differential/Platelet  2. Hyperlipidemia associated with type 2 diabetes mellitus (HCC) - atorvastatin (LIPITOR) 20 MG tablet; Take 1 tablet (20 mg total) by mouth daily.  Dispense: 90 tablet; Refill: 3 - CMP14+EGFR - CBC with Differential/Platelet  3. Hypertension associated with type 2 diabetes mellitus (HCC) - lisinopril (ZESTRIL) 20 MG tablet; Take 1 tablet (20 mg total) by mouth daily.  Dispense: 90 tablet; Refill: 1 - CMP14+EGFR - CBC with Differential/Platelet  4. Type 2 diabetes mellitus without complication, without long-term current use of insulin (HCC) - metFORMIN (GLUCOPHAGE-XR) 500 MG 24 hr tablet; Take 1 tablet (500 mg total) by mouth daily with breakfast.  Dispense: 90 tablet; Refill: 3 - CMP14+EGFR - CBC with Differential/Platelet - Bayer DCA Hb A1c Waived  5. Hepatic steatosis - CMP14+EGFR - CBC with Differential/Platelet  6. Morbid obesity ([place]) - CMP14+EGFR - CBC with Differential/Platelet   Labs pending Health Maintenance reviewed Diet and exercise encouraged  Follow up plan: 3 months and appt with Almyra Free for medication assistance    Evelina Dun, FNP

## 2021-03-02 NOTE — Patient Instructions (Signed)
Diabetes Mellitus and Nutrition, Adult When you have diabetes, or diabetes mellitus, it is very important to have healthy eating habits because your blood sugar (glucose) levels are greatly affected by what you eat and drink. Eating healthy foods in the right amounts, at about the same times every day, can help you:  Control your blood glucose.  Lower your risk of heart disease.  Improve your blood pressure.  Reach or maintain a healthy weight. What can affect my meal plan? Every person with diabetes is different, and each person has different needs for a meal plan. Your health care provider may recommend that you work with a dietitian to make a meal plan that is best for you. Your meal plan may vary depending on factors such as:  The calories you need.  The medicines you take.  Your weight.  Your blood glucose, blood pressure, and cholesterol levels.  Your activity level.  Other health conditions you have, such as heart or kidney disease. How do carbohydrates affect me? Carbohydrates, also called carbs, affect your blood glucose level more than any other type of food. Eating carbs naturally raises the amount of glucose in your blood. Carb counting is a method for keeping track of how many carbs you eat. Counting carbs is important to keep your blood glucose at a healthy level, especially if you use insulin or take certain oral diabetes medicines. It is important to know how many carbs you can safely have in each meal. This is different for every person. Your dietitian can help you calculate how many carbs you should have at each meal and for each snack. How does alcohol affect me? Alcohol can cause a sudden decrease in blood glucose (hypoglycemia), especially if you use insulin or take certain oral diabetes medicines. Hypoglycemia can be a life-threatening condition. Symptoms of hypoglycemia, such as sleepiness, dizziness, and confusion, are similar to symptoms of having too much  alcohol.  Do not drink alcohol if: ? Your health care provider tells you not to drink. ? You are pregnant, may be pregnant, or are planning to become pregnant.  If you drink alcohol: ? Do not drink on an empty stomach. ? Limit how much you use to:  0-1 drink a day for women.  0-2 drinks a day for men. ? Be aware of how much alcohol is in your drink. In the [place] one drink equals one 12 oz bottle of beer (355 mL), one 5 oz glass of wine (148 mL), or one 1 oz glass of hard liquor (44 mL). ? Keep yourself hydrated with water, diet soda, or unsweetened iced tea.  Keep in mind that regular soda, juice, and other mixers may contain a lot of sugar and must be counted as carbs. What are tips for following this plan? Reading food labels  Start by checking the serving size on the "Nutrition Facts" label of packaged foods and drinks. The amount of calories, carbs, fats, and other nutrients listed on the label is based on one serving of the item. Many items contain more than one serving per package.  Check the total grams (g) of carbs in one serving. You can calculate the number of servings of carbs in one serving by dividing the total carbs by 15. For example, if a food has 30 g of total carbs per serving, it would be equal to 2 servings of carbs.  Check the number of grams (g) of saturated fats and trans fats in one serving. Choose foods that have   a low amount or none of these fats.  Check the number of milligrams (mg) of salt (sodium) in one serving. Most people should limit total sodium intake to less than 2,300 mg per day.  Always check the nutrition information of foods labeled as "low-fat" or "nonfat." These foods may be higher in added sugar or refined carbs and should be avoided.  Talk to your dietitian to identify your daily goals for nutrients listed on the label. Shopping  Avoid buying canned, pre-made, or processed foods. These foods tend to be high in fat, sodium, and added  sugar.  Shop around the outside edge of the grocery store. This is where you will most often find fresh fruits and vegetables, bulk grains, fresh meats, and fresh dairy. Cooking  Use low-heat cooking methods, such as baking, instead of high-heat cooking methods like deep frying.  Cook using healthy oils, such as olive, canola, or sunflower oil.  Avoid cooking with butter, cream, or high-fat meats. Meal planning  Eat meals and snacks regularly, preferably at the same times every day. Avoid going long periods of time without eating.  Eat foods that are high in fiber, such as fresh fruits, vegetables, beans, and whole grains. Talk with your dietitian about how many servings of carbs you can eat at each meal.  Eat 4-6 oz (112-168 g) of lean protein each day, such as lean meat, chicken, fish, eggs, or tofu. One ounce (oz) of lean protein is equal to: ? 1 oz (28 g) of meat, chicken, or fish. ? 1 egg. ?  cup (62 g) of tofu.  Eat some foods each day that contain healthy fats, such as avocado, nuts, seeds, and fish.   What foods should I eat? Fruits Berries. Apples. Oranges. Peaches. Apricots. Plums. Grapes. Mango. Papaya. Pomegranate. Kiwi. Cherries. Vegetables Lettuce. Spinach. Leafy greens, including kale, chard, collard greens, and mustard greens. Beets. Cauliflower. Cabbage. Broccoli. Carrots. Green beans. Tomatoes. Peppers. Onions. Cucumbers. Brussels sprouts. Grains Whole grains, such as whole-wheat or whole-grain bread, crackers, tortillas, cereal, and pasta. Unsweetened oatmeal. Quinoa. Brown or wild rice. Meats and other proteins Seafood. Poultry without skin. Lean cuts of poultry and beef. Tofu. Nuts. Seeds. Dairy Low-fat or fat-free dairy products such as milk, yogurt, and cheese. The items listed above may not be a complete list of foods and beverages you can eat. Contact a dietitian for more information. What foods should I avoid? Fruits Fruits canned with  syrup. Vegetables Canned vegetables. Frozen vegetables with butter or cream sauce. Grains Refined white flour and flour products such as bread, pasta, snack foods, and cereals. Avoid all processed foods. Meats and other proteins Fatty cuts of meat. Poultry with skin. Breaded or fried meats. Processed meat. Avoid saturated fats. Dairy Full-fat yogurt, cheese, or milk. Beverages Sweetened drinks, such as soda or iced tea. The items listed above may not be a complete list of foods and beverages you should avoid. Contact a dietitian for more information. Questions to ask a health care provider  Do I need to meet with a diabetes educator?  Do I need to meet with a dietitian?  What number can I call if I have questions?  When are the best times to check my blood glucose? Where to find more information:  American Diabetes Association: diabetes.org  Academy of Nutrition and Dietetics: www.eatright.org  National Institute of Diabetes and Digestive and Kidney Diseases: www.niddk.nih.gov  Association of Diabetes Care and Education Specialists: www.diabeteseducator.org Summary  It is important to have healthy eating   habits because your blood sugar (glucose) levels are greatly affected by what you eat and drink.  A healthy meal plan will help you control your blood glucose and maintain a healthy lifestyle.  Your health care provider may recommend that you work with a dietitian to make a meal plan that is best for you.  Keep in mind that carbohydrates (carbs) and alcohol have immediate effects on your blood glucose levels. It is important to count carbs and to use alcohol carefully. This information is not intended to replace advice given to you by your health care provider. Make sure you discuss any questions you have with your health care provider. Document Revised: 09/08/2019 Document Reviewed: 09/08/2019 Elsevier Patient Education  2021 Elsevier Inc.  

## 2021-03-06 NOTE — Progress Notes (Signed)
Referring Provider: Sharion Balloon, FNP Primary Care Physician:  Sharion Balloon, FNP Primary GI Physician: Dr. Abbey Chatters  Chief Complaint  Patient presents with  . Gastroesophageal Reflux    ok  . fatty liver    HPI:   Derek Decker is a 35 y.o. male presenting today for follow-up of diarrhea, GERD, fatty liver, and elevated LFTs.  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 LFTs have been mildly elevated since September 2020.  History of heavy alcohol use, but no alcohol for greater than 1 year.  Evaluation thus far has included hepatitis C and B negative with insufficient immunity to hepatitis B.  Iron panel within normal limits.  Ultrasound with fatty liver and borderline enlarged CBD at 0.7 cm.  MRI/MRCP with no biliary duct dilation.  Last seen via virtual visit 12/07/2020.  He had gained 5 pounds.  Blood sugars ranging from 140-150 routinely, not taking Invokana due to insurance.  No signs or symptoms of decompensated liver disease.  No alcohol, drug use, Tylenol, OTC supplements.  Continued with red meats and fried/fatty/greasy foods fairly frequently.  Patient was interested in referral to a dietitian.  Continues with intermittent GERD symptoms a couple times a week in the setting of dietary indiscretion.  Since starting metformin, noticed loose to watery bowel movements daily to every other day with 2-3 BMs per day.  Prior to this, he would have 2-3 formed BMs daily.  No alarm symptoms.  Suspected med effect, but would evaluate for thyroid abnormalities and celiac disease.  Also recommended avoiding dairy products and discussing metformin with PCP.  He was again counseled extensively on fatty liver.  Plan to update HFP.  Regarding GERD, he was started on omeprazole 20 mg daily.  Plan to follow-up in 3 months.  Labs I ordered were not completed.   He did have labs on 5/19 with PCP.  AST 26, ALT 45 (down from 57, 8 months ago), alk phos 83, total bilirubin 1.3.  Hemoglobin A1c up to 8.8.   Recommended restarting Ozempic.  Today: GERD: Well controlled on omeprazole 20 mg daily. No nausea/vomioting, dysphagia, or abdominal pain.   Fatty liver/elevated LFTs: Glucose averages around 140 on average.  He is taking Ozempic. Weight: 329 lbs. Down 5 lbs in 3 months. Exercising and eating better.  Tylenol: None OTC Supplements: None Alcohol: None Illicit drugs: None  Denies swelling in his abdomen or lower extremities, yellowing of the eyes, changes in mental status, easy bruising or bleeding.  Diarrhea: Resolved.  Bowels are moving well.  No BRBPR or melena.  Past Medical History:  Diagnosis Date  . Diabetes mellitus without complication ([place])   . Fatty liver   . HLD (hyperlipidemia)   . Hypertension     Past Surgical History:  Procedure Laterality Date  . FRACTURE SURGERY Left 2016   left foot    Current Outpatient Medications  Medication Sig Dispense Refill  . atorvastatin (LIPITOR) 20 MG tablet Take 1 tablet (20 mg total) by mouth daily. 90 tablet 3  . Continuous Blood Gluc Receiver (DEXCOM G6 RECEIVER) DEVI Use to check Blood glucose up to 4 times daily 1 each 1  . Continuous Blood Gluc Sensor (DEXCOM G6 SENSOR) MISC Use to check Blood glucose up to 4 times daily 90 each 9  . Continuous Blood Gluc Transmit (DEXCOM G6 TRANSMITTER) MISC Use to check Blood glucose up to 4 times daily 1 each 4  . glucose blood (ONETOUCH VERIO) test strip USE 1  STRIP TO CHECK GLUCOSE four times a day 100 each 2  . lisinopril (ZESTRIL) 20 MG tablet Take 1 tablet (20 mg total) by mouth daily. 90 tablet 1  . metFORMIN (GLUCOPHAGE-XR) 500 MG 24 hr tablet Take 1 tablet (500 mg total) by mouth daily with breakfast. 90 tablet 3  . omeprazole (PRILOSEC) 20 MG capsule Take 1 capsule (20 mg total) by mouth daily before breakfast. 30 capsule 3  . Semaglutide, 1 MG/DOSE, (OZEMPIC, 1 MG/DOSE,) 2 MG/1.5ML SOPN Inject 1 mg into the skin once a week. 1.5 mL 6   No current facility-administered  medications for this visit.    Allergies as of 03/08/2021  . (No Known Allergies)    Family History  Problem Relation Age of Onset  . Hypertension Mother   . Hypertension Father   . Liver disease Neg Hx   . Colon cancer Neg Hx     Social History   Socioeconomic History  . Marital status: Single    Spouse name: Not on file  . Number of children: Not on file  . Years of education: Not on file  . Highest education level: Not on file  Occupational History  . Not on file  Tobacco Use  . Smoking status: Former Smoker    Packs/day: 0.25    Years: 3.00    Pack years: 0.75  . Smokeless tobacco: Never Used  Vaping Use  . Vaping Use: Never used  Substance and Sexual Activity  . Alcohol use: Not Currently  . Drug use: No  . Sexual activity: Not on file  Other Topics Concern  . Not on file  Social History Narrative  . Not on file   Social Determinants of Health   Financial Resource Strain: Not on file  Food Insecurity: Not on file  Transportation Needs: Not on file  Physical Activity: Not on file  Stress: Not on file  Social Connections: Not on file    Review of Systems: Gen: Denies fever, chills, cold or flulike symptoms, presyncope, syncope. CV: Denies chest pain or palpitations. Resp: Denies dyspnea or cough. GI: See HPI Heme: See HPI  Physical Exam: BP 138/83   Pulse 94   Temp (!) 97.1 F (36.2 C) (Temporal)   Ht '6\' 7"'  (2.007 m)   Wt (!) 329 lb 3.2 oz (149.3 kg)   BMI 37.09 kg/m  General:   Alert and oriented. No distress noted. Pleasant and cooperative.  Head:  Normocephalic and atraumatic. Eyes:  Conjuctiva clear without scleral icterus. Heart:  S1, S2 present without murmurs appreciated. Lungs:  Clear to auscultation bilaterally. No wheezes, rales, or rhonchi. No distress.  Abdomen:  +BS, soft, non-tender and non-distended. No rebound or guarding. No HSM or masses noted. Msk:  Symmetrical without gross deformities. Normal posture. Extremities:   Without edema. Neurologic:  Alert and  oriented x4 Psych:  Normal mood and affect.   Assessment: 35 year old male presenting today for follow-up of GERD, diarrhea, elevated LFTs, and fatty liver.  GERD: Well-controlled on omeprazole 20 mg daily.  No alarm symptoms.  Advised to continue his current medications.  Diarrhea: Resolved.  No alarm symptoms.  Suspect diarrhea was secondary to med effect in setting of metformin.  Advised to monitor for return of symptoms.  Elevated LFTs/fatty liver: History of mild intermittent LFT elevation since September 2020.  Most recent labs 5/19 with AST 26, ALT 45 (down from 57, 8 months ago), alk phos 83, total bilirubin 1.3.  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  Evaluation for elevated LFTs have included Hepatitis B and C negative, iron panel within normal limits. Abdominal ultrasound with fatty liver, CBD borderline enlarged at 0.7 cm, spleen normal.  Follow-up MRI/MRCP with hepatic steatosis, no biliary ductal dilation, caliber of CBD no greater than 4 mm. Denies alcohol, illicit drug use, Tylenol use, or OTC supplements/herbal teas.  He has been trying to eat healthier and exercise more.  He has lost 5 pounds in the last 3 months.  He was congratulated on this.  Unfortunately, due to insurance issues, he went without Ozempic for about 1 month and his hemoglobin A1c had increased to 8.8 on 5/19.  We discussed the importance of tight glucose control in the setting of fatty liver.  Continues without signs or symptoms of decompensated liver disease.   Plan: 1.  Continue omeprazole 20 mg daily 30 minutes before breakfast.  2.  Repeat HFP in 3 months.  3.  Instructions for fatty liver:  Recommend 1-2# weight loss per week until ideal body weight through exercise & diet.  Low fat/cholesterol diet.    Avoid sweets, sodas, fruit juices, sweetened beverages like tea, etc.  Gradually increase exercise  from 15 min daily up to 1 hr per day 5 days/week.  4.  Continue to avoid alcohol, Tylenol, over-the-counter supplements.  5.  Follow-up in 6 months or sooner if needed.    Aliene Altes, PA-C [facility] 03/08/2021

## 2021-03-08 ENCOUNTER — Encounter: Payer: Self-pay | Admitting: Gastroenterology

## 2021-03-08 ENCOUNTER — Ambulatory Visit (INDEPENDENT_AMBULATORY_CARE_PROVIDER_SITE_OTHER): Payer: 59 | Admitting: Gastroenterology

## 2021-03-08 VITALS — BP 138/83 | HR 94 | Temp 97.1°F | Ht 79.0 in | Wt 329.2 lb

## 2021-03-08 DIAGNOSIS — R7989 Other specified abnormal findings of blood chemistry: Secondary | ICD-10-CM

## 2021-03-08 DIAGNOSIS — R197 Diarrhea, unspecified: Secondary | ICD-10-CM

## 2021-03-08 DIAGNOSIS — K219 Gastro-esophageal reflux disease without esophagitis: Secondary | ICD-10-CM

## 2021-03-08 DIAGNOSIS — K760 Fatty (change of) liver, not elsewhere classified: Secondary | ICD-10-CM

## 2021-03-08 NOTE — Patient Instructions (Signed)
Continue omeprazole 20 mg daily 30 minutes before breakfast to control your acid reflux.  Congratulations on your weight loss as far!  You are doing great!  Continue following fatty liver recommendations below: Goal of 1-2# weight loss per week until ideal body weight through exercise & diet. Low fat/cholesterol diet.   Avoid sweets, sodas, fruit juices, sweetened beverages like tea, etc. Gradually increase exercise from 15 min daily up to 1 hr per day 5 days/week.  Continue to avoid alcohol, Tylenol, and over-the-counter supplements.  We will repeat your blood work in 3 months.  We will follow-up with you in 6 months.  Please call with any questions or concerns prior to your next visit.   It was great to see you today!  I hope you have a great summer!  Ermalinda Memos, PA-C [facility]

## 2021-03-08 NOTE — Progress Notes (Signed)
CC'ED TO PCP 

## 2021-03-11 ENCOUNTER — Other Ambulatory Visit: Payer: Self-pay | Admitting: Family

## 2021-03-11 DIAGNOSIS — E1159 Type 2 diabetes mellitus with other circulatory complications: Secondary | ICD-10-CM

## 2021-03-11 DIAGNOSIS — E1169 Type 2 diabetes mellitus with other specified complication: Secondary | ICD-10-CM

## 2021-03-13 IMAGING — MR MR ABDOMEN WO/W CM MRCP
22 of 24 series · 43 of 48 positions shown · IV contrast (Gadavist)
Comparison: Abdominal ultrasound, 07/04/2020

CLINICAL DATA: Elevated bilirubin, dilated CBD on prior ultrasound

EXAM:
MRI ABDOMEN WITHOUT AND WITH CONTRAST (INCLUDING MRCP)
TECHNIQUE: Multiplanar multisequence MR imaging of the abdomen was performed
both before and after the administration of intravenous contrast.
Heavily T2-weighted images of the biliary and pancreatic ducts were
obtained, and three-dimensional MRCP images were rendered by post
processing.
CONTRAST:  10mL GADAVIST GADOBUTROL 1 MMOL/ML IV SOLN

[Series 5: ax haste · axial · 6.0mm · 1.31mm/px · 1 of 9 slices shown (1 of 2)]
[im 1/9]
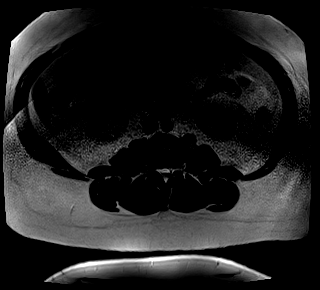

[Series 6: cor haste · coronal · 6.0mm · 1.25mm/px · 1 of 40 slices shown]
[im 1/40]
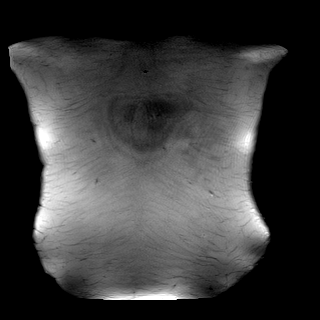

[Series 7: ax haste · axial · 6.0mm · 1.31mm/px · 1 of 38 slices shown (2 of 2)]
[im 1/38]
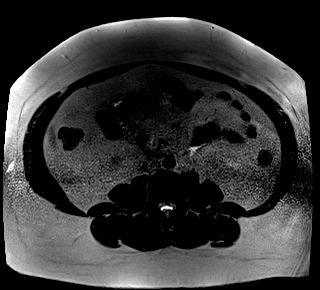

[Series 8: T2 fat-sat · axial · 6.0mm · 1.31mm/px · 1 of 38 slices shown]
[im 1/38]
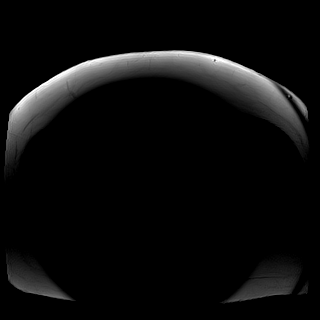

[Series 13: DWI · axial · 6.0mm · 1.64mm/px · 1 of 36 slices shown (1 of 4)]
[im 1/36]
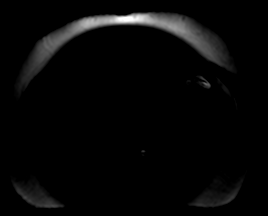

[Series 13: DWI · axial · 6.0mm · 1.64mm/px · 1 of 36 slices shown (2 of 4)]
[im 1/36]
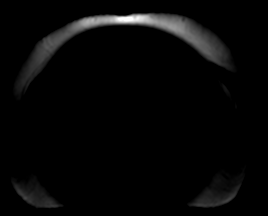

[Series 13: DWI · axial · 6.0mm · 1.64mm/px · 1 of 36 slices shown (3 of 4)]
[im 1/36]
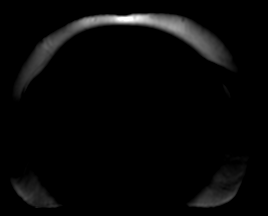

[Series 14: DWI · axial · 6.0mm · 1.64mm/px · 1 of 36 slices shown (4 of 4)]
[im 1/36]
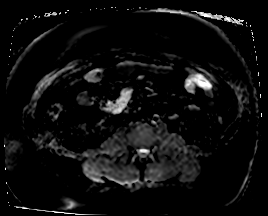

[Series 15: [id number] · coronal · 1.2mm · 0.52mm/px · 3 of 128 slices shown]
[im 1/128]
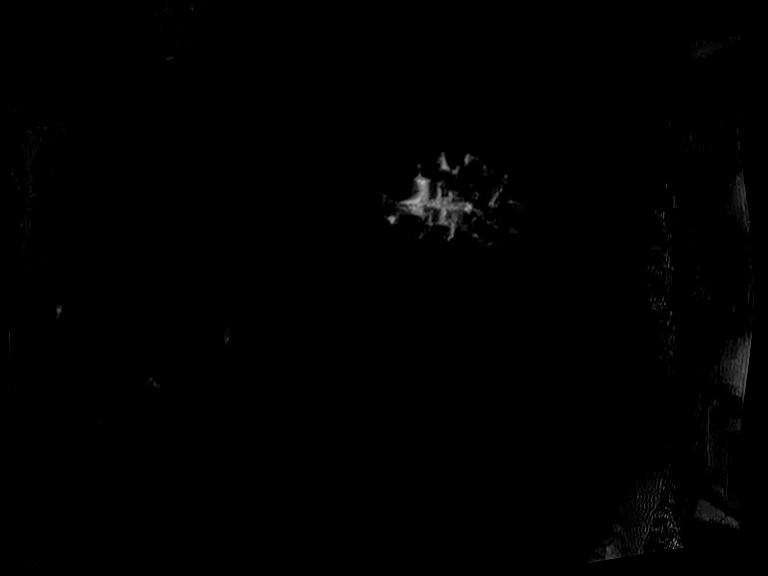
[im 64/128]
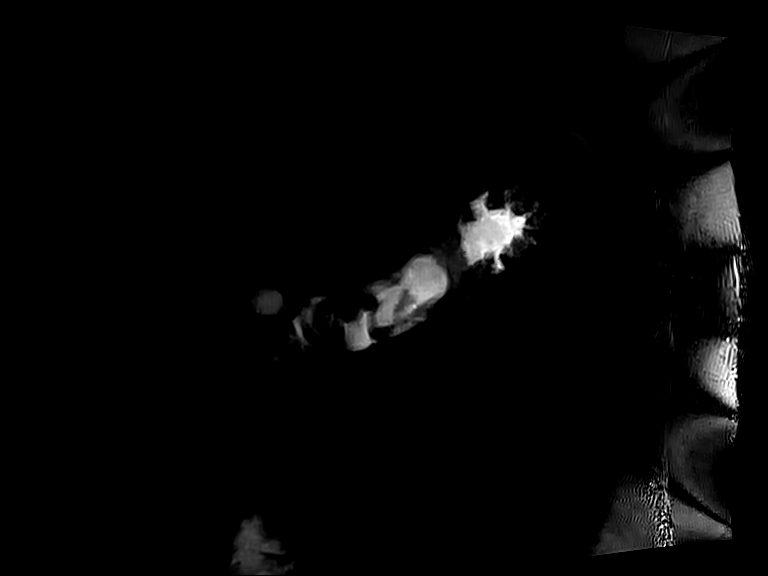
[im 128/128]
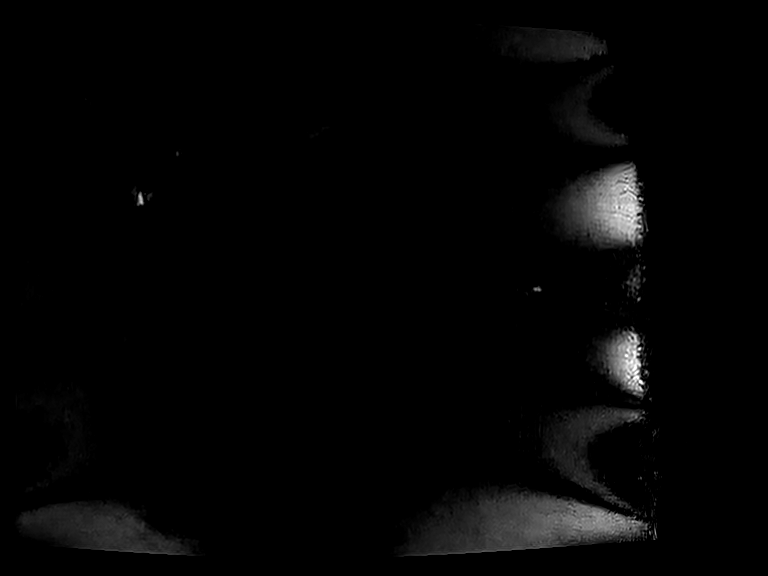

[Series 17: ax in and · axial · 3.0mm · 1.44mm/px · z∈[-41,+220]mm · 2 of 88 slices shown (1 of 2)]
[im 1/88]
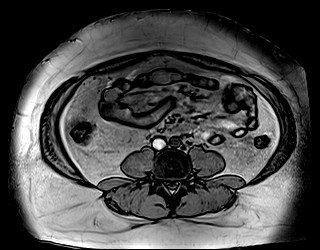
[im 88/88]
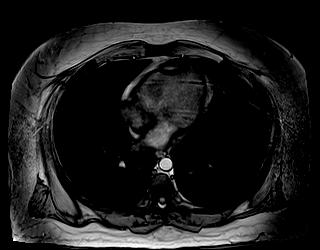

[Series 18: ax in and · axial · 3.0mm · 1.44mm/px · z∈[-41,+220]mm · 2 of 88 slices shown (2 of 2)]
[im 1/88]
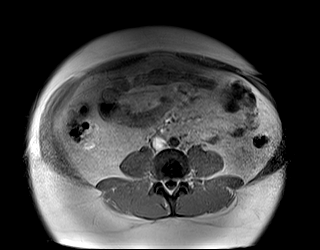
[im 88/88]
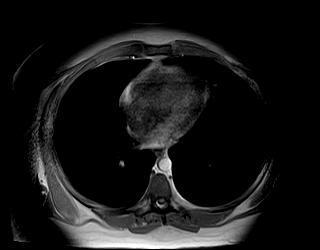

[Series 19: MRCP · coronal · 4.0mm · 1.12mm/px · 1 of 15 slices shown]
[im 1/15]
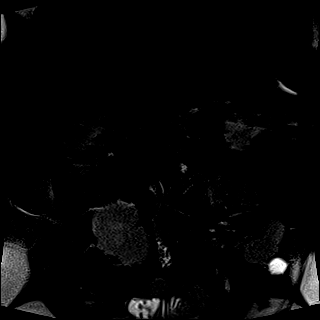

[Series 20: T1 dynamic · axial · non-contrast · 3.0mm · 1.38mm/px · z∈[-41,+220]mm · 2 of 88 slices shown (1 of 4)]
[im 1/88]
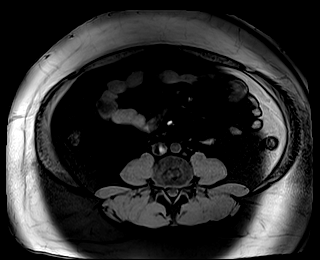
[im 88/88]
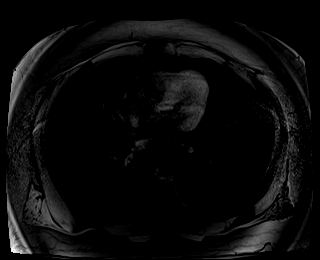

[Series 22: T1 dynamic post-contrast · axial · 3.0mm · 1.38mm/px · z∈[-41,+220]mm · 2 of 88 slices shown (1 of 6)]
[im 1/88]
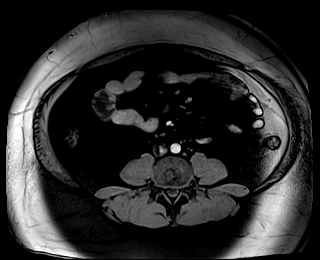
[im 88/88]
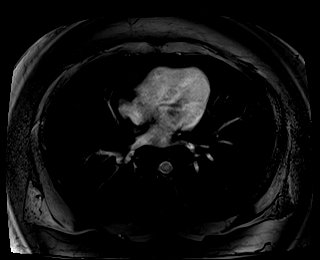

[Series 23: T1 dynamic · axial · 3.0mm · 1.38mm/px · z∈[-41,+220]mm · 2 of 88 slices shown (2 of 4)]
[im 1/88]
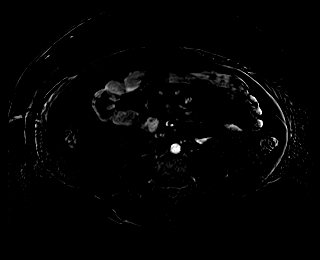
[im 88/88]
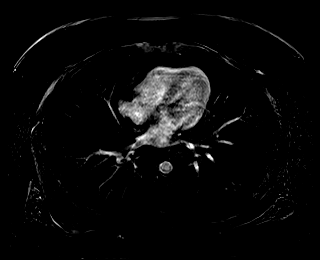

[Series 24: T1 dynamic post-contrast · axial · 3.0mm · 1.38mm/px · z∈[-41,+220]mm · 3 of 88 slices shown (2 of 6)]
[im 1/88]
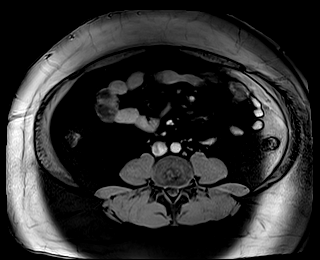
[im 44/88]
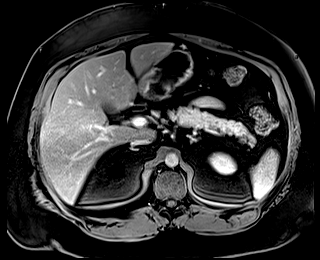
[im 88/88]
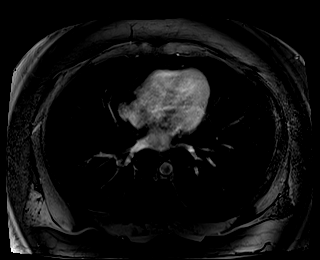

[Series 25: T1 dynamic · axial · 3.0mm · 1.38mm/px · z∈[-41,+220]mm · 3 of 88 slices shown (3 of 4)]
[im 1/88]
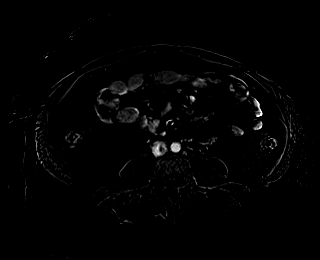
[im 44/88]
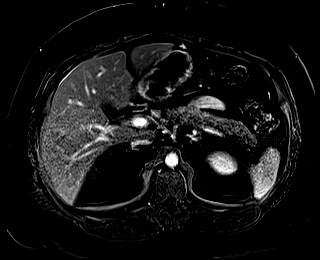
[im 88/88]
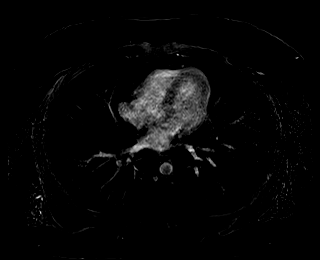

[Series 26: T1 dynamic post-contrast · axial · 3.0mm · 1.38mm/px · z∈[-41,+220]mm · 3 of 88 slices shown (3 of 6)]
[im 1/88]
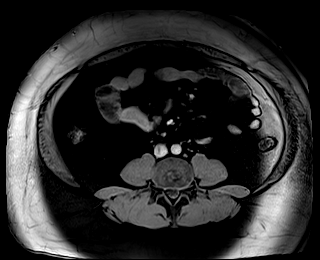
[im 44/88]
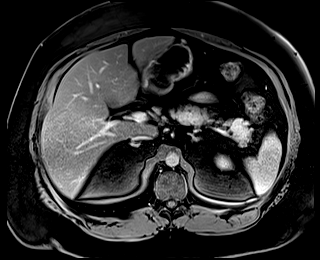
[im 88/88]
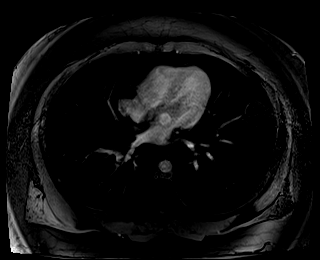

[Series 27: T1 dynamic · axial · 3.0mm · 1.38mm/px · z∈[-41,+220]mm · 3 of 88 slices shown (4 of 4)]
[im 1/88]
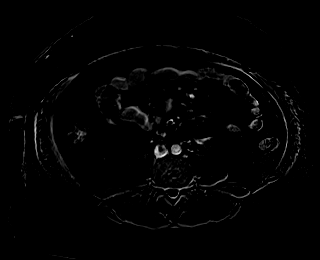
[im 44/88]
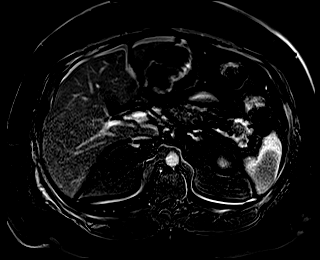
[im 88/88]
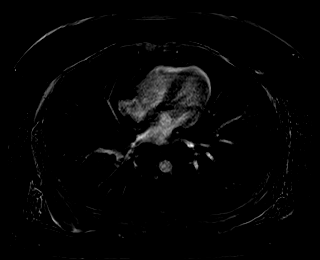

[Series 28: T1 dynamic post-contrast · coronal · 3.0mm · 1.38mm/px · 3 of 88 slices shown (4 of 6)]
[im 1/88]
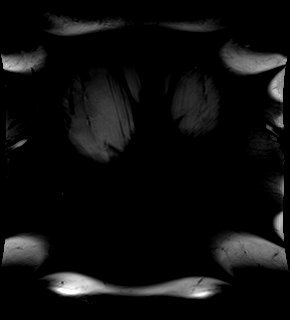
[im 44/88]
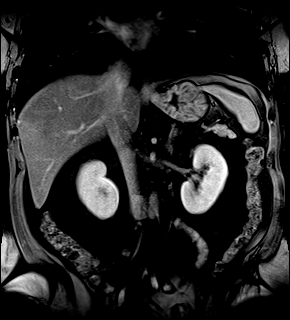
[im 88/88]
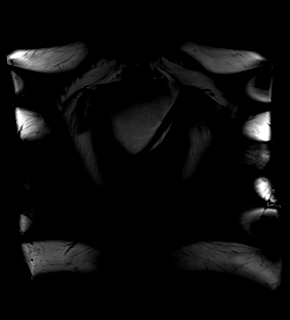

[Series 29: T1 dynamic post-contrast · axial · 3.0mm · 1.38mm/px · z∈[-41,+220]mm · 3 of 88 slices shown (5 of 6)]
[im 1/88]
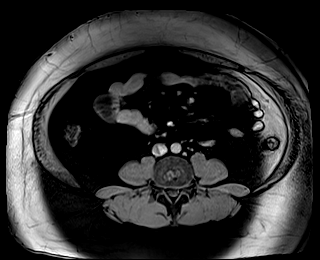
[im 44/88]
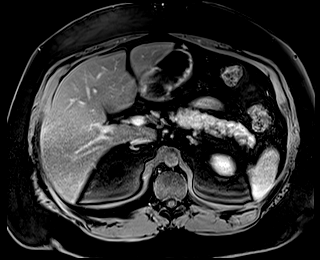
[im 88/88]
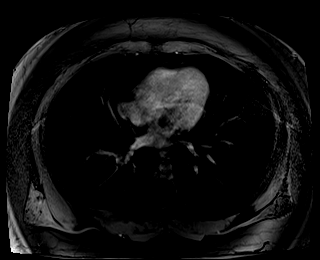

[Series 30: T1 dynamic post-contrast · axial · 3.0mm · 1.38mm/px · z∈[-41,+220]mm · 3 of 88 slices shown (6 of 6)]
[im 1/88]
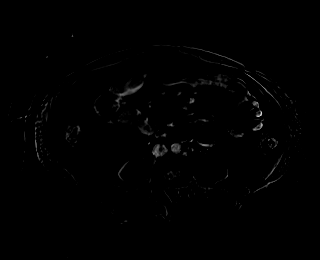
[im 44/88]
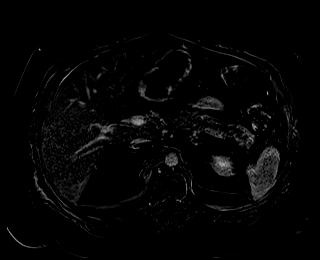
[im 88/88]
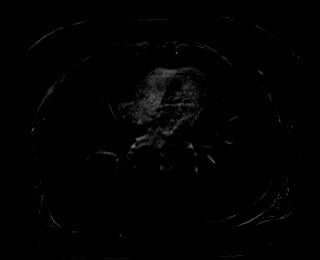

[43 of 48 positions shown; findings below may reference images not displayed]

FINDINGS: Examination is generally limited by significant breath motion
artifact throughout.

Lower chest: No acute findings.

Hepatobiliary: Hepatic steatosis. Some focal fatty sparing about the
gallbladder fossa. No mass or other parenchymal abnormality
identified. Evaluation of the common bile duct is somewhat limited
by breath motion artifact, however with in this limitation, there is
no biliary ductal dilatation and caliber of the CBD is no greater
than 4 mm.

Pancreas: No mass, inflammatory changes, or other parenchymal
abnormality identified.

Spleen:  Within normal limits in size and appearance.

Adrenals/Urinary Tract: No masses identified. No evidence of
hydronephrosis.

Stomach/Bowel: Visualized portions within the abdomen are
unremarkable.

Vascular/Lymphatic: No pathologically enlarged lymph nodes
identified. No abdominal aortic aneurysm demonstrated.

Other:  None.

Musculoskeletal: No suspicious bone lesions identified.
IMPRESSION: 1. Evaluation of the common bile duct is somewhat limited by breath
motion artifact, however, there is no biliary ductal dilatation and
caliber of the CBD is no greater than 4 mm.
2. Hepatic steatosis.

## 2021-03-16 ENCOUNTER — Other Ambulatory Visit: Payer: Self-pay

## 2021-03-16 ENCOUNTER — Ambulatory Visit (INDEPENDENT_AMBULATORY_CARE_PROVIDER_SITE_OTHER): Payer: 59 | Admitting: Pharmacist

## 2021-03-16 DIAGNOSIS — E119 Type 2 diabetes mellitus without complications: Secondary | ICD-10-CM

## 2021-03-16 MED ORDER — OZEMPIC (2 MG/DOSE) 8 MG/3ML ~~LOC~~ SOPN: 2.0000 mg | PEN_INJECTOR | SUBCUTANEOUS | 3 refills | Status: DC

## 2021-03-16 MED ORDER — FREESTYLE LIBRE 2 SENSOR MISC: 0 refills | Status: DC

## 2021-03-16 NOTE — Progress Notes (Signed)
    03/16/2021 Name: Derek Decker MRN: [id number] DOB: 1985/12/15   S:  34 yoM Presents for diabetes evaluation, education, and management. Patient was referred and last seen by Primary Care Provider on 03/02/21.  Insurance coverage/medication affordability: [employer]  Patient reports adherence with medications. Current diabetes medications include: ozempic, metformin Current hypertension medications include: lisinopril Goal 130/80 Current hyperlipidemia medications include: atorvastatin   Patient denies hypoglycemic events.   Patient reported dietary habits: Eats 3 meals/day Discussed meal planning options and Plate method for healthy eating Avoid sugary drinks and desserts Incorporate balanced protein, non starchy veggies, 1 serving of carbohydrate with each meal Increase water intake Increase physical activity as able  Patient-reported exercise habits: n/a   Patient denies nocturia (nighttime urination).  Patient denies neuropathy (nerve pain).  Patient denies visual changes.  Patient reports self foot exams.    O:  Lab Results  Component Value Date   HGBA1C 8.8 (H) 03/02/2021    There were no vitals filed for this visit.     Lipid Panel     Component Value Date/Time   CHOL 161 07/10/2019 1532   TRIG 128 07/10/2019 1532   HDL 49 07/10/2019 1532   CHOLHDL 3.3 07/10/2019 1532   LDLCALC 89 07/10/2019 1532     Home fasting blood sugars: 130-150  2 hour post-meal/random blood sugars: up to low 200s.    Clinical Atherosclerotic Cardiovascular Disease (ASCVD): No   The ASCVD Risk score Denman George DC Jr., et al., 2013) failed to calculate for the following reasons:   The 2013 ASCVD risk score is only valid for ages 20 to 39    A/P:  Diabetes T2DM currently uncontrolled. Patient is able to verbalize appropriate hypoglycemia management plan. Patient is adherent with medication. Control is suboptimal due to diet/lifestyle/copays & insurance (high deductible  plan).  -Continue metformin  -Continue Ozempic samples (ramping up to 1mg  sq weekly)  2mg  pen called in for patient   Copay $0 card given--will hopefully work with insurance, but not guaranteed   -Dexcom was too expensive  Switched to [place] 2 CGM  Free sample given  Free copay card given   Price after samples will be $75/month  -Extensively discussed pathophysiology of diabetes, recommended lifestyle interventions, dietary effects on blood sugar control  -Counseled on s/sx of and management of hypoglycemia  -Next A1C anticipated 05/2021.  Written patient instructions provided.  Total time in face to face counseling 30 minutes.    [place], PharmD, BCPS Clinical Pharmacist, [facility]  II Phone [phone]

## 2021-03-24 ENCOUNTER — Encounter: Payer: 59 | Attending: Family | Admitting: Dietician

## 2021-03-24 ENCOUNTER — Other Ambulatory Visit: Payer: Self-pay

## 2021-03-24 ENCOUNTER — Encounter: Payer: Self-pay | Admitting: Dietician

## 2021-03-24 DIAGNOSIS — E119 Type 2 diabetes mellitus without complications: Secondary | ICD-10-CM | POA: Diagnosis present

## 2021-03-24 NOTE — Patient Instructions (Signed)
Begin to work on mindful eating

## 2021-03-24 NOTE — Progress Notes (Signed)
Medical Nutrition Therapy  Appointment Start time:  0830  Appointment End time:  0900  Primary concerns today: Diabetes  Referral diagnosis: R79.89 Elevated LFTs, E66.01 Morbid Obesity, E11.9 Type 2 DM, K76.0 Fatty Liver Preferred learning style: No preference indicated Learning readiness: Ready   NUTRITION ASSESSMENT   Anthropometrics  Ht: 6'7" Wt: 328.4 lbs Wt Change: - 7 lbs Body mass index is 37 kg/m.   Clinical Medical Hx: HTN, DM, HTN, Fatty Liver Medications: metformin, ozempic, lisinopril, atorvastatin Labs: FBG - 148, ALT - 57 (high)  NEW: A1c - 8.8 (high) Glucose - 264 (Very High), ALT - 45 (high, but much improved) Notable Signs/Symptoms: N/A   Lifestyle & Dietary Hx  Pt A1c has increased from 7.1 to 8.8. Pt reports this is due to going off of Ozempic for a while, around early March, due to a dramatic price increase. Pt has gotten a couple samples that will last them a couple of months until they get the pricing figured out. Pt also discontinued the Dexcom, has been checking with finger prick fasting daily, and is now using [employer]. Pt prefers [employer]. FBG - 130-140 , when not taking Ozempic ~200 PPBG - Highest of 200, usually 130-140 ALT value has improved from 57 to 45 Pt has been drinking a lot more water, and decreased their soda and juice intake.  While off of Ozempic, pt reports eating more mindlessly until they are over full. Pt states they feel much better not eating as much, increased energy level, and helps them sleep better. Pt is still walking daily for 30 minutes, and is interested in joining a gym.    Estimated daily fluid intake: ~100 oz Supplements: N/A Sleep: Mostly sleeps well Stress / self-care: Low Current average weekly physical activity: walks 30 min a day   24-Hr Dietary Recall First Meal: Egg biscuit, water Snack: none Second Meal: 1 slice of pepperoni pizza, water Snack: none Third Meal: 3 chicken wings,  water Snack: none Beverages: Water    NUTRITION DIAGNOSIS  NB-1.1 Food and nutrition-related knowledge deficit As related to diabetes.  As evidenced by FBG of 148, inability to identify sources of carbohydrates, and excessive consumption of SSB.   NUTRITION INTERVENTION  Nutrition education (E-1) on the following topics:  Educated patient on the pathophysiology of diabetes. This includes why our bodies need circulating blood sugar, the relationship between insulin and blood sugar, and the results of insulin resistance and/or pancreatic insufficiency on the development of diabetes. Educated patient on factors that contribute to elevation of blood sugars, such as stress, illness, injury,and food choices. Discussed the role that physical activity plays in lowering blood sugar. Educate patient on the three main macronutrients. Protein, fats, and carbohydrates. Discussed how each of these macronutrients affect blood sugar levels, especially carbohydrate, and the importance of eating a consistent amount of carbohydrate throughout the day. Educated patient on carbohydrate counting, 15g of carbohydrate equals one carb choice. Advised patient on the importance of consistently checking their blood sugar, and recognizing how lifestyle and food choices affect those numbers. Educated patient on the benefit of taking metformin on a full stomach after a meal. Educated patient on the role of SSB on their fatty liver. NEW: Educated patient on mindful eating.  Handouts Provided Include  Yellow Meal Plan Card Balanced Plate NEW: [employer] Patient Assistance Program  Learning Style & Readiness for Change Teaching method utilized: Visual & Auditory  Demonstrated degree of understanding via: Teach Back  Barriers to learning/adherence to lifestyle  change: none  Goals Established by Pt Begin to work on mindful eating. Pay attention to how your food looks, smells, tastes, and feels while you are eating. Avoid  any distractions while eating like phone, TV, tablets, books, etc. Take small bites and chew thoroughly. Swallow each bite before having another. Use smaller plates, bowls, and utensils. Fill out and turn in your Patient Assistance Program application for Ozempic Look into joining a [employer] near you to be able to lift weights, and use the treadmill. Start out by going on on Friday and Sunday evenings Begin by spending no more than 1 hour at the gym, increase this time as endurance improves   MONITORING & EVALUATION Dietary intake, weekly physical activity, and blood glucose in 6 weeks.  Next Steps  Patient is to follow up with dietitian.

## 2021-04-05 ENCOUNTER — Other Ambulatory Visit: Payer: Self-pay | Admitting: Family

## 2021-04-20 ENCOUNTER — Other Ambulatory Visit: Payer: Self-pay | Admitting: *Deleted

## 2021-04-20 DIAGNOSIS — R7989 Other specified abnormal findings of blood chemistry: Secondary | ICD-10-CM

## 2021-05-05 ENCOUNTER — Encounter: Payer: 59 | Attending: Family | Admitting: Dietician

## 2021-05-05 ENCOUNTER — Other Ambulatory Visit: Payer: Self-pay

## 2021-05-05 ENCOUNTER — Encounter: Payer: Self-pay | Admitting: Dietician

## 2021-05-05 DIAGNOSIS — R7989 Other specified abnormal findings of blood chemistry: Secondary | ICD-10-CM

## 2021-05-05 DIAGNOSIS — K760 Fatty (change of) liver, not elsewhere classified: Secondary | ICD-10-CM | POA: Insufficient documentation

## 2021-05-05 NOTE — Progress Notes (Signed)
Medical Nutrition Therapy  Appointment Start time:  1130  Appointment End time:  1200  Primary concerns today: Diabetes  Referral diagnosis: R79.89 Elevated LFTs, E66.01 Morbid Obesity, E11.9 Type 2 DM, K76.0 Fatty Liver Preferred learning style: No preference indicated Learning readiness: Ready   NUTRITION ASSESSMENT   Anthropometrics  Ht: 6'7" Wt: 323.4 lbs Wt Change: - 12 lbs Body mass index is 36.42 kg/m.   Clinical Medical Hx: HTN, DM, HTN, Fatty Liver Medications: metformin, ozempic, lisinopril, atorvastatin Labs: A1c - 8.8 (high) Glucose - 264 (Very High), ALT - 45 (high, but much improved) Notable Signs/Symptoms: N/A   Lifestyle & Dietary Hx Pt is checking BG each day fasting, usually sees between 120-140. Pt has not seen FBG values over 200 since our last visit. Pt is still walking 30 minutes each day. Pt is now eating three meals a day, 5-6 hours apart. Pt states that they get hungry in the evenings 2-3 day a week, but does not snack in the evenings anymore. Pt has cut back on soda as well. Pt will be visiting their PCP in August for a check up and will be getting labs done.   Estimated daily fluid intake: ~100 oz Supplements: N/A Sleep: Mostly sleeps well Stress / self-care: Low Current average weekly physical activity: walks 30 min a day   24-Hr Dietary Recall First Meal: 2 pieces of toast, fried egg in oil Snack: none Second Meal: Ham and cheese sandwich, white bread Snack: none Third Meal: [employer] chicken tenders, fries, water with lemon Snack: none Beverages: Water, 1 soda    NUTRITION DIAGNOSIS  NB-1.1 Food and nutrition-related knowledge deficit As related to diabetes.  As evidenced by FBG of 148, inability to identify sources of carbohydrates, and excessive consumption of SSB.   NUTRITION INTERVENTION  Nutrition education (E-1) on the following topics:  Educated patient on the pathophysiology of diabetes. This includes why our bodies need  circulating blood sugar, the relationship between insulin and blood sugar, and the results of insulin resistance and/or pancreatic insufficiency on the development of diabetes. Educated patient on factors that contribute to elevation of blood sugars, such as stress, illness, injury,and food choices. Discussed the role that physical activity plays in lowering blood sugar. Educate patient on the three main macronutrients. Protein, fats, and carbohydrates. Discussed how each of these macronutrients affect blood sugar levels, especially carbohydrate, and the importance of eating a consistent amount of carbohydrate throughout the day. Educated patient on carbohydrate counting, 15g of carbohydrate equals one carb choice. Advised patient on the importance of consistently checking their blood sugar, and recognizing how lifestyle and food choices affect those numbers. Educated patient on the benefit of taking metformin on a full stomach after a meal. Educated patient on the role of SSB on their fatty liver. NEW: Educated patient on mindful eating.  Handouts Provided Include  Yellow Meal Plan Card Balanced Plate [employer] Patient Assistance Program  Learning Style & Readiness for Change Teaching method utilized: Visual & Auditory  Demonstrated degree of understanding via: Teach Back  Barriers to learning/adherence to lifestyle change: none  Goals Established by Pt Talk to your PCP about different options for your GLP-1 medication. Recognize when you are hungry in the evenings and consider what you had to eat throughout the day. If it was not enough, add in some food for breakfast or lunch. Look for your fasting blood sugar numbers to consistently get under 125. Choose "Steam In Bag" frozen vegetables instead of canned veggies.   MONITORING &  EVALUATION Dietary intake, weekly physical activity, and blood glucose PRN.  Next Steps  Patient is to call [facility] to schedule a follow up with dietitian.

## 2021-05-05 NOTE — Patient Instructions (Addendum)
Talk to your PCP about different options for your GLP-1 medication.  Recognize when you are hungry in the evenings and consider what you had to eat throughout the day. If it was not enough, add in some food for breakfast or lunch.  Look for your fasting blood sugar numbers to consistently get under 125.  Choose "Steam In Bag" frozen vegetables instead of canned veggies.

## 2021-05-17 ENCOUNTER — Telehealth: Payer: Self-pay | Admitting: Pharmacist

## 2021-05-17 ENCOUNTER — Telehealth: Payer: 59 | Admitting: Nurse Practitioner

## 2021-05-17 DIAGNOSIS — E119 Type 2 diabetes mellitus without complications: Secondary | ICD-10-CM | POA: Diagnosis not present

## 2021-05-17 MED ORDER — TRULICITY 3 MG/0.5ML ~~LOC~~ SOAJ: 3.0000 mg | SUBCUTANEOUS | 2 refills | Status: DC

## 2021-05-17 NOTE — Telephone Encounter (Signed)
Insurance not covering ozempic $800 High deductible plan We will likely have to call in glipizide or another generic; GLP even with copay card will not be affordable Calling in trulicity to see if somehow cheaper Patient to call back to let us know Continue metformin for now

## 2021-05-17 NOTE — Progress Notes (Signed)
Virtual Visit Consent   Derek Decker, you are scheduled for a virtual visit with Mary-Margaret Daphine Deutscher, FNP, a [facility] provider, today.     Just as with appointments in the office, your consent must be obtained to participate.  Your consent will be active for this visit and any virtual visit you may have with one of our providers in the next 365 days.     If you have a MyChart account, a copy of this consent can be sent to you electronically.  All virtual visits are billed to your insurance company just like a traditional visit in the office.    As this is a virtual visit, video technology does not allow for your provider to perform a traditional examination.  This may limit your provider's ability to fully assess your condition.  If your provider identifies any concerns that need to be evaluated in person or the need to arrange testing (such as labs, EKG, etc.), we will make arrangements to do so.     Although advances in technology are sophisticated, we cannot ensure that it will always work on either your end or our end.  If the connection with a video visit is poor, the visit may have to be switched to a telephone visit.  With either a video or telephone visit, we are not always able to ensure that we have a secure connection.     I need to obtain your verbal consent now.   Are you willing to proceed with your visit today? YES   REINHARDT LICAUSI has provided verbal consent on 05/17/2021 for a virtual visit (video or telephone).   Mary-Margaret Daphine Deutscher, FNP   Date: 05/17/2021 8:40 AM   Virtual Visit via Video Note   I, Mary-Margaret Daphine Deutscher, connected with ACETON KINNEAR ([id number], August 14, 1986) on 05/17/21 at  8:45 AM EDT by a video-enabled telemedicine application and verified that I am speaking with the correct person using two identifiers.  Location: Patient: Virtual Visit Location Patient: Other: work Provider: Pharmacist, community: Mobile   I discussed the limitations of  evaluation and management by telemedicine and the availability of in person appointments. The patient expressed understanding and agreed to proceed.    History of Present Illness: Derek Decker is a 35 y.o. who identifies as a male who was assigned male at birth, and is being seen today for medication question.  HPI: I on ozempivc and the pharmacy is trying to charge him over $800 for meds. Orma Render at [facility] is the one who has prescribed him. She gave him some samples.  Trying to Orma Render at [facility] to see what he sholod do. Problems:  Patient Active Problem List   Diagnosis Date Noted   Fatty liver 03/08/2021   Diarrhea 12/07/2020   Elevated LFTs 06/27/2020   Gastroesophageal reflux disease 06/27/2020   Hepatic steatosis 04/14/2020   Hyperlipidemia associated with type 2 diabetes mellitus (HCC) 04/13/2020   Hypertension associated with type 2 diabetes mellitus (HCC) 07/10/2019   Morbid obesity (HCC) 07/10/2019   Type 2 diabetes mellitus without complication, without long-term current use of insulin (HCC) 02/26/2019    Allergies: No Known Allergies Medications:  Current Outpatient Medications:    atorvastatin (LIPITOR) 20 MG tablet, Take 1 tablet (20 mg total) by mouth daily., Disp: 90 tablet, Rfl: 3   Continuous Blood Gluc Sensor (FREESTYLE LIBRE 2 SENSOR) MISC, USE TO TEST BLOOD SUGAR 6 TIMES DAILY, Disp: 6 each, Rfl: 2  glucose blood (ONETOUCH VERIO) test strip, USE 1 STRIP TO CHECK GLUCOSE four times a day, Disp: 100 each, Rfl: 2   lisinopril (ZESTRIL) 20 MG tablet, Take 1 tablet (20 mg total) by mouth daily., Disp: 90 tablet, Rfl: 1   metFORMIN (GLUCOPHAGE-XR) 500 MG 24 hr tablet, Take 1 tablet (500 mg total) by mouth daily with breakfast., Disp: 90 tablet, Rfl: 3   omeprazole (PRILOSEC) 20 MG capsule, Take 1 capsule (20 mg total) by mouth daily before breakfast., Disp: 30 capsule, Rfl: 3   Semaglutide, 2 MG/DOSE, (OZEMPIC, 2 MG/DOSE,) 8 MG/3ML SOPN, Inject 2 mg into the skin once a  week. (Patient not taking: Reported on 03/24/2021), Disp: 3 mL, Rfl: 3  Observations/Objective: Patient is well-developed, well-nourished in no acute distress.  Resting comfortably at home.  Head is normocephalic, atraumatic.  No labored breathing. Speech is clear and coherent with logical content.  Patient is alert and oriented at baseline.    Assessment and Plan:  Earma Reading in today with chief complaint of No chief complaint on file.   1. Type 2 diabetes mellitus without complication, without long-term current use of insulin (HCC) Will contact J. Pruitt at [facility] and have her call patient about meds.    The above assessment and management plan was discussed with the patient. The patient verbalized understanding of and has agreed to the management plan. Patient is aware to call the clinic if symptoms persist or worsen. Patient is aware when to return to the clinic for a follow-up visit. Patient educated on when it is appropriate to go to the emergency department.   Mary-Margaret Daphine Deutscher, FNP    Follow Up Instructions: I discussed the assessment and treatment plan with the patient. The patient was provided an opportunity to ask questions and all were answered. The patient agreed with the plan and demonstrated an understanding of the instructions.  A copy of instructions were sent to the patient via MyChart.  The patient was advised to call back or seek an in-person evaluation if the symptoms worsen or if the condition fails to improve as anticipated.  Time:  I spent 8 minutes with the patient via telehealth technology discussing the above problems/concerns.    Mary-Margaret Daphine Deutscher, FNP

## 2021-06-01 ENCOUNTER — Ambulatory Visit (INDEPENDENT_AMBULATORY_CARE_PROVIDER_SITE_OTHER): Payer: 59 | Admitting: Family

## 2021-06-01 ENCOUNTER — Other Ambulatory Visit: Payer: Self-pay

## 2021-06-01 ENCOUNTER — Encounter: Payer: Self-pay | Admitting: Family

## 2021-06-01 VITALS — BP 127/84 | HR 103 | Temp 97.5°F | Ht 79.0 in | Wt 320.2 lb

## 2021-06-01 DIAGNOSIS — E1169 Type 2 diabetes mellitus with other specified complication: Secondary | ICD-10-CM | POA: Diagnosis not present

## 2021-06-01 DIAGNOSIS — E1159 Type 2 diabetes mellitus with other circulatory complications: Secondary | ICD-10-CM | POA: Diagnosis not present

## 2021-06-01 DIAGNOSIS — E785 Hyperlipidemia, unspecified: Secondary | ICD-10-CM

## 2021-06-01 DIAGNOSIS — K760 Fatty (change of) liver, not elsewhere classified: Secondary | ICD-10-CM

## 2021-06-01 DIAGNOSIS — I152 Hypertension secondary to endocrine disorders: Secondary | ICD-10-CM

## 2021-06-01 DIAGNOSIS — E119 Type 2 diabetes mellitus without complications: Secondary | ICD-10-CM | POA: Diagnosis not present

## 2021-06-01 DIAGNOSIS — M25511 Pain in right shoulder: Secondary | ICD-10-CM

## 2021-06-01 LAB — BAYER DCA HB A1C WAIVED: HB A1C (BAYER DCA - WAIVED): 7 % — ABNORMAL HIGH (ref <7.0)

## 2021-06-01 MED ORDER — DICLOFENAC SODIUM 75 MG PO TBEC: 75.0000 mg | DELAYED_RELEASE_TABLET | Freq: Two times a day (BID) | ORAL | 0 refills | Status: DC

## 2021-06-01 MED ORDER — TIRZEPATIDE 5 MG/0.5ML ~~LOC~~ SOAJ: 5.0000 mg | SUBCUTANEOUS | 0 refills | Status: DC

## 2021-06-01 NOTE — Addendum Note (Signed)
Addended by: Ignacia Bayley on: 06/01/2021 02:52 PM   Modules accepted: Orders

## 2021-06-01 NOTE — Patient Instructions (Signed)
Shoulder Pain °Many things can cause shoulder pain, including: °An injury to the shoulder. °Overuse of the shoulder. °Arthritis. °The source of the pain can be: °Inflammation. °An injury to the shoulder joint. °An injury to a tendon, ligament, or bone. °Follow these instructions at home: °Pay attention to changes in your symptoms. Let your health care provider know about them. Follow these instructions to relieve your pain. °If you have a sling: °Wear the sling as told by your health care provider. Remove it only as told by your health care provider. °Loosen the sling if your fingers tingle, become numb, or turn cold and blue. °Keep the sling clean. °If the sling is not waterproof: °Do not let it get wet. Remove it to shower or bathe. °Move your arm as little as possible, but keep your hand moving to prevent swelling. °Managing pain, stiffness, and swelling ° °If directed, put ice on the painful area: °Put ice in a plastic bag. °Place a towel between your skin and the bag. °Leave the ice on for 20 minutes, 2-3 times per day. Stop applying ice if it does not help with the pain. °Squeeze a soft ball or a foam pad as much as possible. This helps to keep the shoulder from swelling. It also helps to strengthen the arm. °General instructions °Take over-the-counter and prescription medicines only as told by your health care provider. °Keep all follow-up visits as told by your health care provider. This is important. °Contact a health care provider if: °Your pain gets worse. °Your pain is not relieved with medicines. °New pain develops in your arm, hand, or fingers. °Get help right away if: °Your arm, hand, or fingers: °Tingle. °Become numb. °Become swollen. °Become painful. °Turn white or blue. °Summary °Shoulder pain can be caused by an injury, overuse, or arthritis. °Pay attention to changes in your symptoms. Let your health care provider know about them. °This condition may be treated with a sling, ice, and pain  medicines. °Contact your health care provider if the pain gets worse or new pain develops. Get help right away if your arm, hand, or fingers tingle or become numb, swollen, or painful. °Keep all follow-up visits as told by your health care provider. This is important. °This information is not intended to replace advice given to you by your health care provider. Make sure you discuss any questions you have with your health care provider. °Document Revised: 04/15/2018 Document Reviewed: 04/15/2018 °Elsevier Patient Education © 2022 [employer] ° °

## 2021-06-01 NOTE — Progress Notes (Signed)
Subjective:    Patient ID: Derek Decker, male    DOB: [birth year], 35 y.o.   MRN: [id number]  Chief Complaint  Patient presents with   Medical Management of Chronic Issues   Shoulder Pain    Right shoulder pain x 3 weeks no known injury    Pt presents to the office today for chronic follow up. His A1C was elevated and we started on Trulicitiy . However, he states his prescription is $1000. He can not afford.    He is followed by GI for fatty liver and followed by dietitian. .  Shoulder Pain  The pain is present in the right shoulder. This is a new problem. The current episode started 1 to 4 weeks ago. There has been no history of extremity trauma. The problem occurs constantly. The problem has been waxing and waning. The quality of the pain is described as aching. The pain is at a severity of 7/10. The pain is moderate. He has tried heat and rest for the symptoms. The treatment provided mild relief.  Diabetes He presents for his follow-up diabetic visit. He has type 2 diabetes mellitus. Pertinent negatives for diabetes include no blurred vision and no foot paresthesias. Symptoms are stable. Pertinent negatives for diabetic complications include no heart disease. Risk factors for coronary artery disease include dyslipidemia, diabetes mellitus, male sex and hypertension. He is following a generally healthy diet. His overall blood glucose range is 140-180 mg/dl.  Hypertension This is a chronic problem. The current episode started more than 1 year ago. The problem has been resolved since onset. The problem is controlled. Pertinent negatives include no blurred vision, malaise/fatigue, peripheral edema or shortness of breath. Risk factors for coronary artery disease include dyslipidemia and male gender. The current treatment provides moderate improvement.  Hyperlipidemia This is a chronic problem. The current episode started more than 1 year ago. Exacerbating diseases include obesity. Pertinent  negatives include no shortness of breath. Current antihyperlipidemic treatment includes statins. The current treatment provides moderate improvement of lipids. Risk factors for coronary artery disease include dyslipidemia, diabetes mellitus, male sex, hypertension and a sedentary lifestyle.     Review of Systems  Constitutional:  Negative for malaise/fatigue.  Eyes:  Negative for blurred vision.  Respiratory:  Negative for shortness of breath.   All other systems reviewed and are negative.     Objective:   Physical Exam Vitals reviewed.  Constitutional:      General: He is not in acute distress.    Appearance: He is well-developed. He is obese.  HENT:     Head: Normocephalic.     Right Ear: Tympanic membrane normal.     Left Ear: Tympanic membrane normal.  Eyes:     General:        Right eye: No discharge.        Left eye: No discharge.     Pupils: Pupils are equal, round, and reactive to light.  Neck:     Thyroid: No thyromegaly.  Cardiovascular:     Rate and Rhythm: Normal rate and regular rhythm.     Heart sounds: Normal heart sounds. No murmur heard. Pulmonary:     Effort: Pulmonary effort is normal. No respiratory distress.     Breath sounds: Normal breath sounds. No wheezing.  Abdominal:     General: Bowel sounds are normal. There is no distension.     Palpations: Abdomen is soft.     Tenderness: There is no abdominal tenderness.  Musculoskeletal:  General: Tenderness (pain in right shoulder with abudction) present. Normal range of motion.     Cervical back: Normal range of motion and neck supple.  Skin:    General: Skin is warm and dry.     Findings: No erythema or rash.  Neurological:     Mental Status: He is alert and oriented to person, place, and time.     Cranial Nerves: No cranial nerve deficit.     Deep Tendon Reflexes: Reflexes are normal and symmetric.  Psychiatric:        Behavior: Behavior normal.        Thought Content: Thought content  normal.        Judgment: Judgment normal.     BP 127/84   Pulse (!) 103   Temp (!) 97.5 F (36.4 C) (Temporal)   Ht '6\' 7"'  (2.007 m)   Wt (!) 320 lb 3.2 oz (145.2 kg)   SpO2 96%   BMI 36.07 kg/m       Assessment & Plan:  Derek Decker comes in today with chief complaint of Medical Management of Chronic Issues and Shoulder Pain (Right shoulder pain x 3 weeks no known injury )   Diagnosis and orders addressed:  1. Hypertension associated with type 2 diabetes mellitus (HCC) - BMP8+EGFR  2. Hepatic steatosis  - BMP8+EGFR  3. Fatty liver - BMP8+EGFR  4. Type 2 diabetes mellitus without complication, without long-term current use of insulin (HCC) Start Mounjaro 2.5 mg for 4 weeks then 5 mg  Strict low carb - tirzepatide (MOUNJARO) 5 MG/0.5ML Pen; Inject 5 mg into the skin once a week.  Dispense: 6 mL; Refill: 0 - Bayer DCA Hb A1c Waived - BMP8+EGFR  5. Hyperlipidemia associated with type 2 diabetes mellitus (HCC) - BMP8+EGFR  6. Morbid obesity (HCC) - BMP8+EGFR  7. Acute pain of right shoulder No other NSAID's while taking Diclofenac  Rest - BMP8+EGFR - diclofenac (VOLTAREN) 75 MG EC tablet; Take 1 tablet (75 mg total) by mouth 2 (two) times daily.  Dispense: 30 tablet; Refill: 0   Labs pending Health Maintenance reviewed Diet and exercise encouraged  Follow up plan: 3 months with me and Almyra Free in 2 months   Evelina Dun, FNP

## 2021-06-02 ENCOUNTER — Other Ambulatory Visit: Payer: Self-pay | Admitting: Family

## 2021-06-02 LAB — HEPATIC FUNCTION PANEL
ALT: 44 IU/L (ref 0–44)
AST: 24 IU/L (ref 0–40)
Albumin: 4.8 g/dL (ref 4.0–5.0)
Alkaline Phosphatase: 76 IU/L (ref 44–121)
Bilirubin Total: 1.2 mg/dL (ref 0.0–1.2)
Bilirubin, Direct: 0.11 mg/dL (ref 0.00–0.40)
Total Protein: 7.2 g/dL (ref 6.0–8.5)

## 2021-06-02 LAB — BMP8+EGFR
BUN/Creatinine Ratio: 12 (ref 9–20)
BUN: 11 mg/dL (ref 6–20)
CO2: 26 mmol/L (ref 20–29)
Calcium: 9.8 mg/dL (ref 8.7–10.2)
Chloride: 102 mmol/L (ref 96–106)
Creatinine, Ser: 0.91 mg/dL (ref 0.76–1.27)
Glucose: 195 mg/dL — ABNORMAL HIGH (ref 65–99)
Potassium: 4.5 mmol/L (ref 3.5–5.2)
Sodium: 141 mmol/L (ref 134–144)
eGFR: 112 mL/min/{1.73_m2} (ref >59)

## 2021-07-06 ENCOUNTER — Ambulatory Visit (INDEPENDENT_AMBULATORY_CARE_PROVIDER_SITE_OTHER): Payer: 59 | Admitting: Pharmacist

## 2021-07-06 ENCOUNTER — Other Ambulatory Visit: Payer: Self-pay

## 2021-07-06 DIAGNOSIS — E119 Type 2 diabetes mellitus without complications: Secondary | ICD-10-CM

## 2021-07-06 MED ORDER — TIRZEPATIDE 7.5 MG/0.5ML ~~LOC~~ SOAJ
7.5000 mg | SUBCUTANEOUS | 2 refills | Status: DC
Start: 2021-07-06 — End: 2021-11-03

## 2021-07-06 NOTE — Progress Notes (Signed)
    07/06/2021 Name: Derek Decker MRN: [id number] DOB: Nov 30, 1985   S:  35 yoM Presents for diabetes evaluation, education, and management. Patient was referred and last seen by Primary Care Provider on 06/01/21   Insurance coverage/medication affordability: [employer]   Patient reports adherence with medications. Current diabetes medications include: Mounjaro, metformin Current hypertension medications include: lisinopril Goal 130/80 Current hyperlipidemia medications include: atorvastatin   Patient denies hypoglycemic events.   Patient reported dietary habits: Eats 3 meals/day Discussed meal planning options and Plate method for healthy eating Avoid sugary drinks and desserts Incorporate balanced protein, non starchy veggies, 1 serving of carbohydrate with each meal Increase water intake Increase physical activity as able   Patient-reported exercise habits: n/a     Patient denies nocturia (nighttime urination).   Patient denies neuropathy (nerve pain).   Patient denies visual changes.   Patient reports self foot exams.      O:  Lab Results  Component Value Date   HGBA1C 7.0 (H) 06/01/2021   Lipid Panel     Component Value Date/Time   CHOL 161 07/10/2019 1532   TRIG 128 07/10/2019 1532   HDL 49 07/10/2019 1532   CHOLHDL 3.3 07/10/2019 1532   LDLCALC 89 07/10/2019 1532          Clinical Atherosclerotic Cardiovascular Disease (ASCVD): No   The ASCVD Risk score (Arnett DK, et al., 2019) failed to calculate for the following reasons:   The 2019 ASCVD risk score is only valid for ages 32 to 43    A/P:  Diabetes T2DM currently uncontrolled. Patient is able to verbalize appropriate hypoglycemia management plan. Patient is adherent with medication. Control is suboptimal due to diet/lifestyle/copays & insurance (high deductible plan).   -Continue metformin   -INCREASE Mounjaro to 7.5mg  sq weekly Patient tolerating well; time in target range improved,  weight loss Covered by copay card $25--called in the drug store Denies personal and family history of Medullary thyroid cancer (MTC)   -Continue libre 2 CGM--connected to [place] [facility] today   -Extensively discussed pathophysiology of diabetes, recommended lifestyle interventions, dietary effects on blood sugar control  -Counseled on s/sx of and management of hypoglycemia  Written patient instructions provided.  Total time in face to face counseling 25 minutes.   Kieth Brightly, PharmD, BCPS Clinical Pharmacist, [facility]  II Phone [phone]

## 2021-07-15 ENCOUNTER — Other Ambulatory Visit: Payer: Self-pay | Admitting: Family

## 2021-07-15 DIAGNOSIS — K219 Gastro-esophageal reflux disease without esophagitis: Secondary | ICD-10-CM

## 2021-08-02 ENCOUNTER — Ambulatory Visit (INDEPENDENT_AMBULATORY_CARE_PROVIDER_SITE_OTHER): Payer: 59 | Admitting: Family

## 2021-08-02 ENCOUNTER — Other Ambulatory Visit: Payer: Self-pay

## 2021-08-02 ENCOUNTER — Encounter: Payer: Self-pay | Admitting: Family

## 2021-08-02 VITALS — BP 153/88 | HR 96 | Temp 97.9°F | Resp 20 | Ht 79.0 in | Wt 337.0 lb

## 2021-08-02 DIAGNOSIS — Z23 Encounter for immunization: Secondary | ICD-10-CM | POA: Diagnosis not present

## 2021-08-02 DIAGNOSIS — E785 Hyperlipidemia, unspecified: Secondary | ICD-10-CM

## 2021-08-02 DIAGNOSIS — I152 Hypertension secondary to endocrine disorders: Secondary | ICD-10-CM

## 2021-08-02 DIAGNOSIS — E1169 Type 2 diabetes mellitus with other specified complication: Secondary | ICD-10-CM

## 2021-08-02 DIAGNOSIS — K760 Fatty (change of) liver, not elsewhere classified: Secondary | ICD-10-CM | POA: Diagnosis not present

## 2021-08-02 DIAGNOSIS — E1159 Type 2 diabetes mellitus with other circulatory complications: Secondary | ICD-10-CM | POA: Diagnosis not present

## 2021-08-02 DIAGNOSIS — K219 Gastro-esophageal reflux disease without esophagitis: Secondary | ICD-10-CM

## 2021-08-02 DIAGNOSIS — E119 Type 2 diabetes mellitus without complications: Secondary | ICD-10-CM

## 2021-08-02 NOTE — Patient Instructions (Signed)

## 2021-08-02 NOTE — Progress Notes (Signed)
Subjective:    Patient ID: Derek Decker, male    DOB: [birth year], 35 y.o.   MRN: [id number]  Chief Complaint  Patient presents with   Medical Management of Chronic Issues   Pt presents to the office today for chronic follow up.    He is followed by GI for fatty liver and followed by dietitian. .  Diabetes He presents for his follow-up diabetic visit. He has type 2 diabetes mellitus. Pertinent negatives for diabetes include no blurred vision and no foot paresthesias. Symptoms are stable. Pertinent negatives for diabetic complications include no heart disease. Risk factors for coronary artery disease include dyslipidemia, diabetes mellitus, male sex and sedentary lifestyle. He is following a generally unhealthy diet. His overall blood glucose range is 130-140 mg/dl.  Hypertension This is a chronic problem. The current episode started more than 1 year ago. The problem has been resolved since onset. Pertinent negatives include no blurred vision, malaise/fatigue, peripheral edema or shortness of breath. Risk factors for coronary artery disease include dyslipidemia, diabetes mellitus, male gender and obesity. The current treatment provides mild improvement.  Gastroesophageal Reflux He complains of belching and heartburn. He reports no abdominal pain. This is a chronic problem. The current episode started more than 1 year ago. The problem occurs occasionally. He has tried a PPI for the symptoms. The treatment provided moderate relief.  Hyperlipidemia This is a chronic problem. The current episode started more than 1 year ago. Exacerbating diseases include obesity. Pertinent negatives include no shortness of breath. Current antihyperlipidemic treatment includes statins. The current treatment provides moderate improvement of lipids. Risk factors for coronary artery disease include dyslipidemia, diabetes mellitus, male sex, hypertension and a sedentary lifestyle.     Review of Systems   Constitutional:  Negative for malaise/fatigue.  Eyes:  Negative for blurred vision.  Respiratory:  Negative for shortness of breath.   Gastrointestinal:  Positive for heartburn. Negative for abdominal pain.  All other systems reviewed and are negative.     Objective:   Physical Exam Vitals reviewed.  Constitutional:      General: He is not in acute distress.    Appearance: He is well-developed. He is obese.  HENT:     Head: Normocephalic.     Right Ear: Tympanic membrane normal.     Left Ear: Tympanic membrane normal.  Eyes:     General:        Right eye: No discharge.        Left eye: No discharge.     Pupils: Pupils are equal, round, and reactive to light.  Neck:     Thyroid: No thyromegaly.  Cardiovascular:     Rate and Rhythm: Normal rate and regular rhythm.     Heart sounds: Normal heart sounds. No murmur heard. Pulmonary:     Effort: Pulmonary effort is normal. No respiratory distress.     Breath sounds: Normal breath sounds. No wheezing.  Abdominal:     General: Bowel sounds are normal. There is no distension.     Palpations: Abdomen is soft.     Tenderness: There is no abdominal tenderness.  Musculoskeletal:        General: No tenderness. Normal range of motion.     Cervical back: Normal range of motion and neck supple.  Skin:    General: Skin is warm and dry.     Findings: No erythema or rash.  Neurological:     Mental Status: He is alert and oriented to person, place, and  time.     Cranial Nerves: No cranial nerve deficit.     Deep Tendon Reflexes: Reflexes are normal and symmetric.  Psychiatric:        Behavior: Behavior normal.        Thought Content: Thought content normal.        Judgment: Judgment normal.         BP (!) 154/98   Pulse 96   Temp 97.9 F (36.6 C) (Temporal)   Resp 20   Ht '6\' 7"'  (2.007 m)   Wt (!) 337 lb (152.9 kg)   SpO2 94%   BMI 37.96 kg/m   Assessment & Plan:  NICOLO TOMKO comes in today with chief complaint of  Medical Management of Chronic Issues   Diagnosis and orders addressed:  1. Need for immunization against influenza - Flu Vaccine QUAD 48moIM (Fluarix, Fluzone & Alfiuria Quad PF) - CMP14+EGFR  2. Hypertension associated with type 2 diabetes mellitus (HEsto Pt will check BP at home and call and let [place]. BP was at goal last visit.  - CMP14+EGFR  3. Gastroesophageal reflux disease, unspecified whether esophagitis present - CMP14+EGFR  4. Hepatic steatosis - CMP14+EGFR  5. Type 2 diabetes mellitus without complication, without long-term current use of insulin (HCC) - CMP14+EGFR  6. Hyperlipidemia associated with type 2 diabetes mellitus ([place] - CMP14+EGFR  7. Morbid obesity ([place] - CMP14+EGFR   Labs pending Health Maintenance reviewed Diet and exercise encouraged  Follow up plan: 3 months    CEvelina Dun FNP

## 2021-08-03 LAB — CMP14+EGFR
ALT: 34 IU/L (ref 0–44)
AST: 20 IU/L (ref 0–40)
Albumin/Globulin Ratio: 2 (ref 1.2–2.2)
Albumin: 4.5 g/dL (ref 4.0–5.0)
Alkaline Phosphatase: 67 IU/L (ref 44–121)
BUN/Creatinine Ratio: 12 (ref 9–20)
BUN: 11 mg/dL (ref 6–20)
Bilirubin Total: 0.8 mg/dL (ref 0.0–1.2)
CO2: 25 mmol/L (ref 20–29)
Calcium: 9.3 mg/dL (ref 8.7–10.2)
Chloride: 103 mmol/L (ref 96–106)
Creatinine, Ser: 0.92 mg/dL (ref 0.76–1.27)
Globulin, Total: 2.2 g/dL (ref 1.5–4.5)
Glucose: 207 mg/dL — ABNORMAL HIGH (ref 70–99)
Potassium: 4.4 mmol/L (ref 3.5–5.2)
Sodium: 142 mmol/L (ref 134–144)
Total Protein: 6.7 g/dL (ref 6.0–8.5)
eGFR: 111 mL/min/1.73

## 2021-08-14 ENCOUNTER — Other Ambulatory Visit: Payer: Self-pay | Admitting: Family

## 2021-08-14 DIAGNOSIS — K219 Gastro-esophageal reflux disease without esophagitis: Secondary | ICD-10-CM

## 2021-08-17 ENCOUNTER — Other Ambulatory Visit: Payer: Self-pay

## 2021-08-17 ENCOUNTER — Ambulatory Visit (INDEPENDENT_AMBULATORY_CARE_PROVIDER_SITE_OTHER): Payer: 59 | Admitting: Family

## 2021-08-17 ENCOUNTER — Encounter: Payer: Self-pay | Admitting: Family

## 2021-08-17 VITALS — BP 131/89 | HR 86 | Temp 97.0°F | Ht 79.0 in | Wt 332.6 lb

## 2021-08-17 DIAGNOSIS — I152 Hypertension secondary to endocrine disorders: Secondary | ICD-10-CM | POA: Diagnosis not present

## 2021-08-17 DIAGNOSIS — E1169 Type 2 diabetes mellitus with other specified complication: Secondary | ICD-10-CM

## 2021-08-17 DIAGNOSIS — E1159 Type 2 diabetes mellitus with other circulatory complications: Secondary | ICD-10-CM

## 2021-08-17 MED ORDER — LISINOPRIL 40 MG PO TABS: 40.0000 mg | ORAL_TABLET | Freq: Every day | ORAL | 3 refills | Status: DC

## 2021-08-17 NOTE — Patient Instructions (Signed)

## 2021-08-17 NOTE — Progress Notes (Signed)
Subjective:    Patient ID: Derek Decker, male    DOB: [birth year], 35 y.o.   MRN: [id number]  Chief Complaint  Patient presents with   Hypertension   Pt presents to the office today to recheck HTN. His BP at home has been 160's/88. It is at goal today. He is morbid obese with a BMI of 37 and DM and HTN.   He is still on his Mounjaro 5 mg because the pharmacy did not have the 7.5 mg. Hopefully, he can start this next month.  Hypertension This is a chronic problem. The current episode started more than 1 year ago. The problem has been waxing and waning since onset. The problem is uncontrolled. Associated symptoms include malaise/fatigue. Pertinent negatives include no peripheral edema or shortness of breath. Risk factors for coronary artery disease include obesity and male gender. Past treatments include ACE inhibitors. The current treatment provides moderate improvement.     Review of Systems  Constitutional:  Positive for malaise/fatigue.  Respiratory:  Negative for shortness of breath.   All other systems reviewed and are negative.     Objective:   Physical Exam Vitals reviewed.  Constitutional:      General: He is not in acute distress.    Appearance: He is well-developed. He is obese.  HENT:     Head: Normocephalic.     Right Ear: Tympanic membrane normal.     Left Ear: Tympanic membrane normal.  Eyes:     General:        Right eye: No discharge.        Left eye: No discharge.     Pupils: Pupils are equal, round, and reactive to light.  Neck:     Thyroid: No thyromegaly.  Cardiovascular:     Rate and Rhythm: Normal rate and regular rhythm.     Heart sounds: Normal heart sounds. No murmur heard. Pulmonary:     Effort: Pulmonary effort is normal. No respiratory distress.     Breath sounds: Normal breath sounds. No wheezing.  Abdominal:     General: Bowel sounds are normal. There is no distension.     Palpations: Abdomen is soft.     Tenderness: There is no abdominal  tenderness.  Musculoskeletal:        General: No tenderness. Normal range of motion.     Cervical back: Normal range of motion and neck supple.  Skin:    General: Skin is warm and dry.     Findings: No erythema or rash.  Neurological:     Mental Status: He is alert and oriented to person, place, and time.     Cranial Nerves: No cranial nerve deficit.     Deep Tendon Reflexes: Reflexes are normal and symmetric.  Psychiatric:        Behavior: Behavior normal.        Thought Content: Thought content normal.        Judgment: Judgment normal.     BP 131/89   Pulse 86   Temp (!) 97 F (36.1 C) (Temporal)   Ht 6\' 7"  (2.007 m)   Wt (!) 332 lb 9.6 oz (150.9 kg)   BMI 37.47 kg/m      Assessment & Plan:  Derek Decker comes in today with chief complaint of Hypertension   Diagnosis and orders addressed:  1. Hypertension associated with type 2 diabetes mellitus (HCC) Will increase Lisinopril to 40 mg from 20 mg  -Dash diet information given -Exercise  encouraged - Stress Management  -Continue current meds -RTO in 1 month  - lisinopril (ZESTRIL) 40 MG tablet; Take 1 tablet (40 mg total) by mouth daily.  Dispense: 90 tablet; Refill: 3  2. Morbid obesity (HCC) Will increase to Mounjaro 7.5 mg next month  3. Type 2 diabetes mellitus with other specified complication, without long-term current use of insulin (HCC) Continue medications  Low carb   Labs pending Health Maintenance reviewed Diet and exercise encouraged  Follow up plan: 3 months    Jannifer Rodney, FNP

## 2021-08-21 ENCOUNTER — Ambulatory Visit: Payer: 59 | Admitting: Family

## 2021-09-04 NOTE — Progress Notes (Signed)
Referring Provider: [name], FNP Primary Care Physician:  [name], FNP Primary GI Physician: Dr. Marletta Lor   Chief Complaint  Patient presents with   elevated LFT    HPI:   Derek Decker is a 35 y.o. male presenting today for follow-up.  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  Evaluation for elevated LFTs have included Hepatitis B and C negative, iron panel within normal limits. Abdominal ultrasound with fatty liver, CBD borderline enlarged at 0.7 cm, spleen normal.  Follow-up MRI/MRCP with hepatic steatosis, no biliary ductal dilation, caliber of CBD no greater than 4 mm. No alcohol, illicit drug use, Tylenol use, or OTC supplements/herbal teas.  Last seen in our office 03/08/2021.  GERD was well controlled on omeprazole 20 mg daily.  He was working on weight loss through diet and exercise and was down 5 pounds in the last 3 months.  No signs or symptoms of decompensated liver disease.  Plan to continue omeprazole 20 mg daily, repeat the HFP in 3 months, continue with weight loss efforts, follow-up in 6 months or sooner if needed.  Labs completed 06/01/2021: HFP within normal limits. Hemoglobin A1c improved to 7.0 from 8.8  CMP 08/02/2021 again with LFTs within normal limits.  Today:  GERD: Well-controlled on omeprazole 20 mg daily.  Denies dysphagia, nausea, vomiting, abdominal pain.  Fatty Liver:  Denies abdominal distention, peripheral edema, scleral icterus, confusion/mental status changes, bruising/bleeding. Slacked up on his diet.  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 Had been going to the gym, but slacked up on this as well.  Had previously been following with a nutritionist, but the nutritionist moved and he has not been seen since July.  Offered referral back to nutritionist or [facility], but patient states he  will think about this.  Weights:  329 lbs September 2021 320 lbs August 2022 336 lbs today.   No other GI concerns.  Bowels moving well.  Denies BRBPR or melena.    Past Medical History:  Diagnosis Date   Diabetes mellitus without complication (HCC)    Fatty liver    HLD (hyperlipidemia)    Hypertension     Past Surgical History:  Procedure Laterality Date   FRACTURE SURGERY Left 2016   left foot    Current Outpatient Medications  Medication Sig Dispense Refill   atorvastatin (LIPITOR) 20 MG tablet Take 1 tablet (20 mg total) by mouth daily. 90 tablet 3   Continuous Blood Gluc Sensor (FREESTYLE LIBRE 2 SENSOR) MISC USE TO TEST BLOOD SUGAR 6 TIMES DAILY 6 each 2   glucose blood (ONETOUCH VERIO) test strip USE 1 STRIP TO CHECK GLUCOSE four times a day 100 each 2   lisinopril (ZESTRIL) 40 MG tablet Take 1 tablet (40 mg total) by mouth daily. 90 tablet 3   metFORMIN (GLUCOPHAGE-XR) 500 MG 24 hr tablet Take 1 tablet (500 mg total) by mouth daily with breakfast. 90 tablet 3   omeprazole (PRILOSEC) 20 MG capsule TAKE 1 CAPSULE BY MOUTH EVERY DAY BEFORE BREAKFAST 30 capsule 0   tirzepatide (MOUNJARO) 7.5 MG/0.5ML Pen Inject 7.5 mg into the skin once a week. 2 mL 2   diclofenac (VOLTAREN) 75 MG EC tablet Take 1 tablet (75 mg total) by mouth 2 (two) times daily. (Patient not taking: Reported on 09/06/2021) 30 tablet 0   No current  facility-administered medications for this visit.    Allergies as of 09/06/2021   (No Known Allergies)    Family History  Problem Relation Age of Onset   Hypertension Mother    Hypertension Father    Liver disease Neg Hx    Colon cancer Neg Hx     Social History   Socioeconomic History   Marital status: Single    Spouse name: Not on file   Number of children: Not on file   Years of education: Not on file   Highest education level: Not on file  Occupational History   Not on file  Tobacco Use   Smoking status: Former    Packs/day: 0.25     Years: 3.00    Pack years: 0.75    Types: Cigarettes   Smokeless tobacco: Never  Vaping Use   Vaping Use: Never used  Substance and Sexual Activity   Alcohol use: Not Currently   Drug use: No   Sexual activity: Not on file  Other Topics Concern   Not on file  Social History Narrative   Not on file   Social Determinants of Health   Financial Resource Strain: Not on file  Food Insecurity: Not on file  Transportation Needs: Not on file  Physical Activity: Not on file  Stress: Not on file  Social Connections: Not on file    Review of Systems: Gen: Denies fever, chills, cold or flulike symptoms, presyncope, syncope. CV: Denies chest pain, palpitations. Resp: Denies dyspnea or cough. GI: See HPI Heme: See HPI  Physical Exam: BP (!) 159/92   Pulse 87   Temp (!) 96.8 F (36 C)   Ht 6\' 7"  (2.007 m)   Wt (!) 336 lb 9.6 oz (152.7 kg)   BMI 37.92 kg/m  General:   Alert and oriented. No distress noted. Pleasant and cooperative.  Head:  Normocephalic and atraumatic. Eyes:  Conjuctiva clear without scleral icterus. Heart:  S1, S2 present without murmurs appreciated. Lungs:  Clear to auscultation bilaterally. No wheezes, rales, or rhonchi. No distress.  Abdomen:  +BS, soft, non-tender and non-distended. No rebound or guarding. No HSM or masses noted. Msk:  Symmetrical without gross deformities. Normal posture. Extremities:  Without edema. Neurologic:  Alert and  oriented x4 Psych:  Normal mood and affect.    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  GERD:  Well-controlled on omeprazole 20 mg daily.  No alarm symptoms.  Elevated LFTs with fatty liver:  Mild intermittent LFT elevations since September 2020, likely secondary to fatty liver.  Evaluation thus far with Hepatitis B and C negative, iron panel within normal limits.  Abdominal ultrasound with fatty liver, CBD borderline enlarged at 0.7 cm, spleen normal.  Follow-up MRI/MRCP with hepatic steatosis, no biliary ductal dilation, caliber of CBD no greater than 4 mm. No alcohol, illicit drug use, Tylenol use, or OTC supplements/herbal teas. Encouragingly, his liver enzymes returned to normal in August 2022 and remained normal in October.  Remains without signs or symptoms of decompensated liver disease.  We discussed fatty liver in detail today including risk of progression to fibrosis and cirrhosis.  He had been following with a nutritionist and was losing weight slowly.  Unfortunately, he has now gained some weight back as he has slacked up on his diet, decreased exercise, and is no longer seeing a nutritionist as they left the area.  Plan:  Continue omeprazole 20 mg daily 30 minutes before breakfast. Counseled on fatty liver.  Encouraged 10% reduction in his weight through diet and exercise. Offered referral back to nutritionist or [facility], but patient prefers to think about this.  He will let me know if he would like a referral. Continue to avoid alcohol, Tylenol, over-the-counter supplements. Monitor LFTs q 6 months. This can be completed with routine lab work with PCP.  Follow-up in 6 months or sooner if needed.    Ermalinda Memos, PA-C [facility] 09/06/2021

## 2021-09-06 ENCOUNTER — Ambulatory Visit (INDEPENDENT_AMBULATORY_CARE_PROVIDER_SITE_OTHER): Payer: 59 | Admitting: Gastroenterology

## 2021-09-06 ENCOUNTER — Encounter: Payer: Self-pay | Admitting: Gastroenterology

## 2021-09-06 ENCOUNTER — Other Ambulatory Visit: Payer: Self-pay

## 2021-09-06 VITALS — BP 159/92 | HR 87 | Temp 96.8°F | Ht 79.0 in | Wt 336.6 lb

## 2021-09-06 DIAGNOSIS — K760 Fatty (change of) liver, not elsewhere classified: Secondary | ICD-10-CM

## 2021-09-06 DIAGNOSIS — K219 Gastro-esophageal reflux disease without esophagitis: Secondary | ICD-10-CM | POA: Diagnosis not present

## 2021-09-06 DIAGNOSIS — R7989 Other specified abnormal findings of blood chemistry: Secondary | ICD-10-CM | POA: Diagnosis not present

## 2021-09-06 NOTE — Patient Instructions (Signed)
Continue omeprazole 20 mg daily 30 minutes before breakfast.  Instructions for fatty liver: Recommend 1-2# weight loss per week through exercise & diet. Overall, recommend reduction of your body weight by 10% (about 33 pounds) Low fat/cholesterol diet.   Avoid sweets, sodas, fruit juices, sweetened beverages like tea, etc. Gradually increase exercise from 15 min daily up to 1 hr per day 5 days/week.  Let me know if you would like a referral back to a nutritionist or to [facility].  Continue to avoid alcohol, over-the-counter supplements, herbal teas.  Continue to limit Tylenol, but you may use it if needed.  No more than 2000-3000 mg/day.  We will plan to see you back in 6 months.  Do not hesitate to call if you have any questions or concerns prior to your next visit.  It was great to see you again today!  I hope you have a very happy Thanksgiving!  Ermalinda Memos, PA-C [facility]

## 2021-09-13 ENCOUNTER — Other Ambulatory Visit: Payer: Self-pay | Admitting: Family

## 2021-09-13 DIAGNOSIS — K219 Gastro-esophageal reflux disease without esophagitis: Secondary | ICD-10-CM

## 2021-09-27 ENCOUNTER — Telehealth: Payer: 59 | Admitting: Physician Assistant

## 2021-09-27 DIAGNOSIS — J069 Acute upper respiratory infection, unspecified: Secondary | ICD-10-CM | POA: Diagnosis not present

## 2021-09-27 MED ORDER — FLUTICASONE PROPIONATE 50 MCG/ACT NA SUSP: 2.0000 | Freq: Every day | NASAL | 0 refills | Status: DC

## 2021-09-27 MED ORDER — ONDANSETRON 4 MG PO TBDP: 4.0000 mg | ORAL_TABLET | Freq: Three times a day (TID) | ORAL | 0 refills | Status: DC | PRN

## 2021-09-27 MED ORDER — BENZONATATE 100 MG PO CAPS
100.0000 mg | ORAL_CAPSULE | Freq: Three times a day (TID) | ORAL | 0 refills | Status: DC | PRN
Start: 2021-09-27 — End: 2021-11-03

## 2021-09-27 NOTE — Progress Notes (Signed)
Virtual Visit Consent   Derek Decker, you are scheduled for a virtual visit with a [facility] provider today.     Just as with appointments in the office, your consent must be obtained to participate.  Your consent will be active for this visit and any virtual visit you may have with one of our providers in the next 365 days.     If you have a MyChart account, a copy of this consent can be sent to you electronically.  All virtual visits are billed to your insurance company just like a traditional visit in the office.    As this is a virtual visit, video technology does not allow for your provider to perform a traditional examination.  This may limit your provider's ability to fully assess your condition.  If your provider identifies any concerns that need to be evaluated in person or the need to arrange testing (such as labs, EKG, etc.), we will make arrangements to do so.     Although advances in technology are sophisticated, we cannot ensure that it will always work on either your end or our end.  If the connection with a video visit is poor, the visit may have to be switched to a telephone visit.  With either a video or telephone visit, we are not always able to ensure that we have a secure connection.     I need to obtain your verbal consent now.   Are you willing to proceed with your visit today?    Derek Decker has provided verbal consent on 09/27/2021 for a virtual visit (video or telephone).   Piedad Climes, [place]   Date: 09/27/2021 9:41 AM   Virtual Visit via Video Note   I, Piedad Climes, connected with  Derek Decker  ([id number], 01-10-1986) on 09/27/21 at  9:30 AM EST by a video-enabled telemedicine application and verified that I am speaking with the correct person using two identifiers.  Location: Patient: Virtual Visit Location Patient: Home Provider: Virtual Visit Location Provider: Home   I discussed the limitations of evaluation and management by  telemedicine and the availability of in person appointments. The patient expressed understanding and agreed to proceed.    History of Present Illness: Derek Decker is a 35 y.o. who identifies as a male who was assigned male at birth, and is being seen today for symptoms starting 3 days ago. Initially noting nasal/head congestion, rhinorrhea and scratchy throat. Denies fever, chills, aches. Initially headaches which have improved. Denies sinus pain or tooth pain. Now with some nausea and "upset stomach" with loose stools. Denies melena or hematochezia. Denies recent travel or known sick contact. Has taken 3 COVID tests which all have been negative. Has taken Robitussin cough syrup and Pepto Bismol OTC.   HPI: HPI  Problems:  Patient Active Problem List   Diagnosis Date Noted   Fatty liver 03/08/2021   Elevated LFTs 06/27/2020   Gastroesophageal reflux disease 06/27/2020   Hepatic steatosis 04/14/2020   Hyperlipidemia associated with type 2 diabetes mellitus (HCC) 04/13/2020   Hypertension associated with type 2 diabetes mellitus (HCC) 07/10/2019   Morbid obesity (HCC) 07/10/2019   Diabetes mellitus (HCC) 02/26/2019    Allergies: No Known Allergies Medications:  Current Outpatient Medications:    benzonatate (TESSALON) 100 MG capsule, Take 1 capsule (100 mg total) by mouth 3 (three) times daily as needed for cough., Disp: 30 capsule, Rfl: 0   fluticasone (FLONASE) 50 MCG/ACT nasal spray, Place  2 sprays into both nostrils daily., Disp: 16 g, Rfl: 0   ondansetron (ZOFRAN-ODT) 4 MG disintegrating tablet, Take 1 tablet (4 mg total) by mouth every 8 (eight) hours as needed for nausea or vomiting., Disp: 20 tablet, Rfl: 0   atorvastatin (LIPITOR) 20 MG tablet, Take 1 tablet (20 mg total) by mouth daily., Disp: 90 tablet, Rfl: 3   Continuous Blood Gluc Sensor (FREESTYLE LIBRE 2 SENSOR) MISC, USE TO TEST BLOOD SUGAR 6 TIMES DAILY, Disp: 6 each, Rfl: 2   diclofenac (VOLTAREN) 75 MG EC tablet, Take 1  tablet (75 mg total) by mouth 2 (two) times daily. (Patient not taking: Reported on 09/06/2021), Disp: 30 tablet, Rfl: 0   glucose blood (ONETOUCH VERIO) test strip, USE 1 STRIP TO CHECK GLUCOSE four times a day, Disp: 100 each, Rfl: 2   lisinopril (ZESTRIL) 40 MG tablet, Take 1 tablet (40 mg total) by mouth daily., Disp: 90 tablet, Rfl: 3   metFORMIN (GLUCOPHAGE-XR) 500 MG 24 hr tablet, Take 1 tablet (500 mg total) by mouth daily with breakfast., Disp: 90 tablet, Rfl: 3   omeprazole (PRILOSEC) 20 MG capsule, TAKE 1 CAPSULE BY MOUTH EVERY DAY BEFORE BREAKFAST, Disp: 30 capsule, Rfl: 1   tirzepatide (MOUNJARO) 7.5 MG/0.5ML Pen, Inject 7.5 mg into the skin once a week., Disp: 2 mL, Rfl: 2  Observations/Objective: Patient is well-developed, well-nourished in no acute distress.  Resting comfortably at home.  Head is normocephalic, atraumatic.  No labored breathing. Speech is clear and coherent with logical content.  Patient is alert and oriented at baseline.   Assessment and Plan: 1. Viral URI with cough - fluticasone (FLONASE) 50 MCG/ACT nasal spray; Place 2 sprays into both nostrils daily.  Dispense: 16 g; Refill: 0 - benzonatate (TESSALON) 100 MG capsule; Take 1 capsule (100 mg total) by mouth 3 (three) times daily as needed for cough.  Dispense: 30 capsule; Refill: 0 - ondansetron (ZOFRAN-ODT) 4 MG disintegrating tablet; Take 1 tablet (4 mg total) by mouth every 8 (eight) hours as needed for nausea or vomiting.  Dispense: 20 tablet; Refill: 0 Negative COVID. Not consistent with Flu. Mild viral URI with loose stool which could be directly from infection or secondary to drainage. Supportive measures, vitamin recommendations and OTC medications reviewed. Tessalon and Zofran per orders. Will Rx Flonase as well for nasal congestion.   Follow Up Instructions: I discussed the assessment and treatment plan with the patient. The patient was provided an opportunity to ask questions and all were  answered. The patient agreed with the plan and demonstrated an understanding of the instructions.  A copy of instructions were sent to the patient via MyChart unless otherwise noted below.   The patient was advised to call back or seek an in-person evaluation if the symptoms worsen or if the condition fails to improve as anticipated.  Time:  I spent 12 minutes with the patient via telehealth technology discussing the above problems/concerns.    Piedad Climes, PA-C

## 2021-09-27 NOTE — Patient Instructions (Signed)
Derek Decker, thank you for joining Piedad Climes, PA-C for today's virtual visit.  While this provider is not your primary care provider (PCP), if your PCP is located in our provider database this encounter information will be shared with them immediately following your visit.  Consent: (Patient) Derek Decker provided verbal consent for this virtual visit at the beginning of the encounter.  Current Medications:  Current Outpatient Medications:    atorvastatin (LIPITOR) 20 MG tablet, Take 1 tablet (20 mg total) by mouth daily., Disp: 90 tablet, Rfl: 3   Continuous Blood Gluc Sensor (FREESTYLE LIBRE 2 SENSOR) MISC, USE TO TEST BLOOD SUGAR 6 TIMES DAILY, Disp: 6 each, Rfl: 2   diclofenac (VOLTAREN) 75 MG EC tablet, Take 1 tablet (75 mg total) by mouth 2 (two) times daily. (Patient not taking: Reported on 09/06/2021), Disp: 30 tablet, Rfl: 0   glucose blood (ONETOUCH VERIO) test strip, USE 1 STRIP TO CHECK GLUCOSE four times a day, Disp: 100 each, Rfl: 2   lisinopril (ZESTRIL) 40 MG tablet, Take 1 tablet (40 mg total) by mouth daily., Disp: 90 tablet, Rfl: 3   metFORMIN (GLUCOPHAGE-XR) 500 MG 24 hr tablet, Take 1 tablet (500 mg total) by mouth daily with breakfast., Disp: 90 tablet, Rfl: 3   omeprazole (PRILOSEC) 20 MG capsule, TAKE 1 CAPSULE BY MOUTH EVERY DAY BEFORE BREAKFAST, Disp: 30 capsule, Rfl: 1   tirzepatide (MOUNJARO) 7.5 MG/0.5ML Pen, Inject 7.5 mg into the skin once a week., Disp: 2 mL, Rfl: 2   Medications ordered in this encounter:  No orders of the defined types were placed in this encounter.    *If you need refills on other medications prior to your next appointment, please contact your pharmacy*  Follow-Up: Call back or seek an in-person evaluation if the symptoms worsen or if the condition fails to improve as anticipated.  Other Instructions Increase fluids and get plenty of rest.  Start OTC Vitamin D3 1000 units daily and Vitamin C 1000 mg daily. You can  continue your Robitussin and take the Tessalon along with it.  I have sent in Zofran for nausea.  Use the Flonase as directed. Follow the dietary recommendations below.  Bland Diet A bland diet consists of foods that are often soft and do not have a lot of fat, fiber, or extra seasonings. Foods without fat, fiber, or seasoning are easier for the body to digest. They are also less likely to irritate your mouth, throat, stomach, and other parts of your digestive system. A bland diet is sometimes called a BRAT diet. What is my plan? Your health care provider or food and nutrition specialist (dietitian) may recommend specific changes to your diet to prevent symptoms or to treat your symptoms. These changes may include: Eating small meals often. Cooking food until it is soft enough to chew easily. Chewing your food well. Drinking fluids slowly. Not eating foods that are very spicy, sour, or fatty. Not eating citrus fruits, such as oranges and grapefruit. What do I need to know about this diet? Eat a variety of foods from the bland diet food list. Do not follow a bland diet longer than needed. Ask your health care provider whether you should take vitamins or supplements. What foods can I eat? Grains Hot cereals, such as cream of wheat. Rice. Bread, crackers, or tortillas made from refined white flour. Vegetables Canned or cooked vegetables. Mashed or boiled potatoes. Fruits Bananas. Applesauce. Other types of cooked or canned fruit with the  skin and seeds removed, such as canned peaches or pears. Meats and other proteins Scrambled eggs. Creamy peanut butter or other nut butters. Lean, well-cooked meats, such as chicken or fish. Tofu. Soups or broths. Dairy Low-fat dairy products, such as milk, cottage cheese, or yogurt. Beverages Water. Herbal tea. Apple juice. Fats and oils Mild salad dressings. Canola or olive oil. Sweets and desserts Pudding. Custard. Fruit gelatin. Ice cream. The  items listed above may not be a complete list of recommended foods and beverages. Contact a dietitian for more options. What foods are not recommended? Grains Whole grain breads and cereals. Vegetables Raw vegetables. Fruits Raw fruits, especially citrus, berries, or dried fruits. Dairy Whole fat dairy foods. Beverages Caffeinated drinks. Alcohol. Seasonings and condiments Strongly flavored seasonings or condiments. Hot sauce. Salsa. Other foods Spicy foods. Fried foods. Sour foods, such as pickled or fermented foods. Foods with high sugar content. Foods high in fiber. The items listed above may not be a complete list of foods and beverages to avoid. Contact a dietitian for more information. Summary A bland diet consists of foods that are often soft and do not have a lot of fat, fiber, or extra seasonings. Foods without fat, fiber, or seasoning are easier for the body to digest. Check with your health care provider to see how long you should follow this diet plan. It is not meant to be followed for long periods. This information is not intended to replace advice given to you by your health care provider. Make sure you discuss any questions you have with your health care provider. Document Revised: 10/30/2017 Document Reviewed: 10/30/2017 Elsevier Patient Education  2022 [employer].     If you have been instructed to have an in-person evaluation today at a local Urgent Care facility, please use the link below. It will take you to a list of all of our available [facility], including address, phone number and hours of operation. Please do not delay care.  [facility]  If you or a family member do not have a primary care provider, use the link below to schedule a visit and establish care. When you choose a [facility] primary care physician or advanced practice provider, you gain a long-term partner in health. Find a Primary Care Provider  Learn more about  [facility]'s in-office and virtual care options:  - Get Care Now

## 2021-10-19 ENCOUNTER — Other Ambulatory Visit: Payer: Self-pay | Admitting: *Deleted

## 2021-10-19 DIAGNOSIS — J069 Acute upper respiratory infection, unspecified: Secondary | ICD-10-CM

## 2021-10-19 MED ORDER — FLUTICASONE PROPIONATE 50 MCG/ACT NA SUSP: 2.0000 | Freq: Every day | NASAL | 0 refills | Status: DC

## 2021-11-02 ENCOUNTER — Ambulatory Visit: Payer: 59 | Admitting: Family

## 2021-11-02 ENCOUNTER — Other Ambulatory Visit: Payer: Self-pay | Admitting: Family

## 2021-11-02 DIAGNOSIS — J069 Acute upper respiratory infection, unspecified: Secondary | ICD-10-CM

## 2021-11-03 ENCOUNTER — Ambulatory Visit (INDEPENDENT_AMBULATORY_CARE_PROVIDER_SITE_OTHER): Payer: 59 | Admitting: Family

## 2021-11-03 ENCOUNTER — Encounter: Payer: Self-pay | Admitting: Family

## 2021-11-03 VITALS — BP 151/90 | HR 111 | Temp 98.0°F | Ht 79.0 in | Wt 333.4 lb

## 2021-11-03 DIAGNOSIS — I152 Hypertension secondary to endocrine disorders: Secondary | ICD-10-CM

## 2021-11-03 DIAGNOSIS — E785 Hyperlipidemia, unspecified: Secondary | ICD-10-CM

## 2021-11-03 DIAGNOSIS — E1169 Type 2 diabetes mellitus with other specified complication: Secondary | ICD-10-CM

## 2021-11-03 DIAGNOSIS — E119 Type 2 diabetes mellitus without complications: Secondary | ICD-10-CM

## 2021-11-03 DIAGNOSIS — E1159 Type 2 diabetes mellitus with other circulatory complications: Secondary | ICD-10-CM

## 2021-11-03 DIAGNOSIS — K760 Fatty (change of) liver, not elsewhere classified: Secondary | ICD-10-CM | POA: Diagnosis not present

## 2021-11-03 DIAGNOSIS — K219 Gastro-esophageal reflux disease without esophagitis: Secondary | ICD-10-CM

## 2021-11-03 LAB — BAYER DCA HB A1C WAIVED: HB A1C (BAYER DCA - WAIVED): 5.9 % — ABNORMAL HIGH (ref 4.8–5.6)

## 2021-11-03 MED ORDER — TIRZEPATIDE 10 MG/0.5ML ~~LOC~~ SOAJ: 10.0000 mg | SUBCUTANEOUS | 2 refills | Status: DC

## 2021-11-03 NOTE — Progress Notes (Signed)
Subjective:    Patient ID: Derek Decker, male    DOB: Jul 23, 1986, 36 y.o.   MRN: [id number]  Chief Complaint  Patient presents with   Medical Management of Chronic Issues   Pt presents to the office today for chronic follow up.    He is followed by GI for fatty liver and followed by dietitian. He is morbid obese with a BMI of 37 and DM and HTN.   He is currently taking Mounjaro 7.5 mg weekly and denies any nausea, vomiting, GERD.  Hypertension This is a chronic problem. The current episode started more than 1 year ago. The problem has been waxing and waning since onset. The problem is uncontrolled. Associated symptoms include malaise/fatigue. Pertinent negatives include no blurred vision, peripheral edema or shortness of breath. Risk factors for coronary artery disease include dyslipidemia, diabetes mellitus, obesity and male gender. The current treatment provides moderate improvement.  Gastroesophageal Reflux He complains of belching and heartburn. He reports no hoarse voice. This is a chronic problem. The current episode started more than 1 year ago. The problem occurs occasionally. The problem has been waxing and waning. Risk factors include obesity. He has tried a PPI for the symptoms. The treatment provided moderate relief.  Hyperlipidemia This is a chronic problem. The current episode started more than 1 year ago. The problem is controlled. Exacerbating diseases include obesity. Pertinent negatives include no shortness of breath. Current antihyperlipidemic treatment includes statins. The current treatment provides moderate improvement of lipids. Risk factors for coronary artery disease include diabetes mellitus, hypertension, a sedentary lifestyle, dyslipidemia, male sex and obesity.  Diabetes He presents for his follow-up diabetic visit. He has type 2 diabetes mellitus. Pertinent negatives for diabetes include no blurred vision and no foot paresthesias. Symptoms are stable. Pertinent  negatives for diabetic complications include no heart disease or peripheral neuropathy. Risk factors for coronary artery disease include dyslipidemia, diabetes mellitus, male sex, hypertension and sedentary lifestyle. He is following a generally unhealthy diet. His overall blood glucose range is 130-140 mg/dl. Eye exam is current.     Review of Systems  Constitutional:  Positive for malaise/fatigue.  HENT:  Negative for hoarse voice.   Eyes:  Negative for blurred vision.  Respiratory:  Negative for shortness of breath.   Gastrointestinal:  Positive for heartburn.  All other systems reviewed and are negative.     Objective:   Physical Exam Vitals reviewed.  Constitutional:      General: He is not in acute distress.    Appearance: He is well-developed. He is obese.  HENT:     Head: Normocephalic.     Right Ear: Tympanic membrane normal.     Left Ear: Tympanic membrane normal.  Eyes:     General:        Right eye: No discharge.        Left eye: No discharge.     Pupils: Pupils are equal, round, and reactive to light.  Neck:     Thyroid: No thyromegaly.  Cardiovascular:     Rate and Rhythm: Normal rate and regular rhythm.     Heart sounds: Normal heart sounds. No murmur heard. Pulmonary:     Effort: Pulmonary effort is normal. No respiratory distress.     Breath sounds: Normal breath sounds. No wheezing.  Abdominal:     General: Bowel sounds are normal. There is no distension.     Palpations: Abdomen is soft.     Tenderness: There is no abdominal tenderness.  Musculoskeletal:        General: No tenderness. Normal range of motion.     Cervical back: Normal range of motion and neck supple.  Skin:    General: Skin is warm and dry.     Findings: No erythema or rash.  Neurological:     Mental Status: He is alert and oriented to person, place, and time.     Cranial Nerves: No cranial nerve deficit.     Deep Tendon Reflexes: Reflexes are normal and symmetric.  Psychiatric:         Behavior: Behavior normal.        Thought Content: Thought content normal.        Judgment: Judgment normal.      BP (!) 151/90    Pulse (!) 111    Temp 98 F (36.7 C) (Temporal)    Ht '6\' 7"'  (2.007 m)    Wt (!) 333 lb 6.4 oz (151.2 kg)    BMI 37.56 kg/m      Assessment & Plan:  Derek Decker comes in today with chief complaint of Medical Management of Chronic Issues   Diagnosis and orders addressed:  1. Type 2 diabetes mellitus without complication, without long-term current use of insulin (HCC) Will increase Mounjaro to 10 mg. Unsure if insurance will continue to pay.  - tirzepatide (MOUNJARO) 10 MG/0.5ML Pen; Inject 10 mg into the skin once a week.  Dispense: 6 mL; Refill: 2 - Bayer DCA Hb A1c Waived - CMP14+EGFR - CBC with Differential/Platelet  2. Hyperlipidemia associated with type 2 diabetes mellitus (HCC) - CMP14+EGFR - CBC with Differential/Platelet  3. Hepatic steatosis - CMP14+EGFR - CBC with Differential/Platelet  4. Gastroesophageal reflux disease, unspecified whether esophagitis present - CMP14+EGFR - CBC with Differential/Platelet  5. Hypertension associated with type 2 diabetes mellitus (HCC) -Elevated today, was at goal last visit. If elevated on next visit will increase/change medications.  - CMP14+EGFR - CBC with Differential/Platelet  6. Morbid obesity ([place]) - CMP14+EGFR - CBC with Differential/Platelet   Labs pending Health Maintenance reviewed Diet and exercise encouraged  Follow up plan: 3 months   Evelina Dun, FNP

## 2021-11-03 NOTE — Patient Instructions (Signed)

## 2021-11-04 LAB — CMP14+EGFR
ALT: 40 IU/L (ref 0–44)
AST: 26 IU/L (ref 0–40)
Albumin/Globulin Ratio: 2 (ref 1.2–2.2)
Albumin: 4.5 g/dL (ref 4.0–5.0)
Alkaline Phosphatase: 69 IU/L (ref 44–121)
BUN/Creatinine Ratio: 9 (ref 9–20)
BUN: 8 mg/dL (ref 6–20)
Bilirubin Total: 1 mg/dL (ref 0.0–1.2)
CO2: 24 mmol/L (ref 20–29)
Calcium: 9.4 mg/dL (ref 8.7–10.2)
Chloride: 105 mmol/L (ref 96–106)
Creatinine, Ser: 0.94 mg/dL (ref 0.76–1.27)
Globulin, Total: 2.2 g/dL (ref 1.5–4.5)
Glucose: 127 mg/dL — ABNORMAL HIGH (ref 70–99)
Potassium: 4.6 mmol/L (ref 3.5–5.2)
Sodium: 145 mmol/L — ABNORMAL HIGH (ref 134–144)
Total Protein: 6.7 g/dL (ref 6.0–8.5)
eGFR: 108 mL/min/{1.73_m2} (ref >59)

## 2021-11-04 LAB — CBC WITH DIFFERENTIAL/PLATELET
Basophils Absolute: 0.1 10*3/uL (ref 0.0–0.2)
Basos: 1 %
EOS (ABSOLUTE): 0.1 10*3/uL (ref 0.0–0.4)
Eos: 2 %
Hematocrit: 45.2 % (ref 37.5–51.0)
Hemoglobin: 15.2 g/dL (ref 13.0–17.7)
Immature Grans (Abs): 0 10*3/uL (ref 0.0–0.1)
Immature Granulocytes: 0 %
Lymphocytes Absolute: 1.6 10*3/uL (ref 0.7–3.1)
Lymphs: 24 %
MCH: 30 pg (ref 26.6–33.0)
MCHC: 33.6 g/dL (ref 31.5–35.7)
MCV: 89 fL (ref 79–97)
Monocytes Absolute: 0.3 10*3/uL (ref 0.1–0.9)
Monocytes: 5 %
Neutrophils Absolute: 4.8 10*3/uL (ref 1.4–7.0)
Neutrophils: 68 %
Platelets: 227 10*3/uL (ref 150–450)
RBC: 5.07 x10E6/uL (ref 4.14–5.80)
RDW: 11.9 % (ref 11.6–15.4)
WBC: 6.9 10*3/uL (ref 3.4–10.8)

## 2021-11-06 ENCOUNTER — Other Ambulatory Visit: Payer: Self-pay | Admitting: Family

## 2021-11-06 ENCOUNTER — Ambulatory Visit: Payer: Self-pay | Admitting: Family

## 2021-11-06 DIAGNOSIS — E119 Type 2 diabetes mellitus without complications: Secondary | ICD-10-CM

## 2021-11-13 ENCOUNTER — Other Ambulatory Visit: Payer: Self-pay | Admitting: Family

## 2021-11-13 ENCOUNTER — Telehealth: Payer: Self-pay | Admitting: Emergency Medicine

## 2021-11-13 DIAGNOSIS — B9689 Other specified bacterial agents as the cause of diseases classified elsewhere: Secondary | ICD-10-CM

## 2021-11-13 DIAGNOSIS — J019 Acute sinusitis, unspecified: Secondary | ICD-10-CM

## 2021-11-13 DIAGNOSIS — K219 Gastro-esophageal reflux disease without esophagitis: Secondary | ICD-10-CM

## 2021-11-13 MED ORDER — AMOXICILLIN-POT CLAVULANATE 875-125 MG PO TABS: 1.0000 | ORAL_TABLET | Freq: Two times a day (BID) | ORAL | 0 refills | Status: DC

## 2021-11-13 NOTE — Progress Notes (Signed)
Virtual Visit Consent   Derek Decker, you are scheduled for a virtual visit with a [facility] provider today.     Just as with appointments in the office, your consent must be obtained to participate.  Your consent will be active for this visit and any virtual visit you may have with one of our providers in the next 365 days.     If you have a MyChart account, a copy of this consent can be sent to you electronically.  All virtual visits are billed to your insurance company just like a traditional visit in the office.    As this is a virtual visit, video technology does not allow for your provider to perform a traditional examination.  This may limit your provider's ability to fully assess your condition.  If your provider identifies any concerns that need to be evaluated in person or the need to arrange testing (such as labs, EKG, etc.), we will make arrangements to do so.     Although advances in technology are sophisticated, we cannot ensure that it will always work on either your end or our end.  If the connection with a video visit is poor, the visit may have to be switched to a telephone visit.  With either a video or telephone visit, we are not always able to ensure that we have a secure connection.     I need to obtain your verbal consent now.   Are you willing to proceed with your visit today?    JAKEOB TULLIS has provided verbal consent on 11/13/2021 for a virtual visit (video or telephone).   Cathlyn Parsons, NP   Date: 11/13/2021 1:26 PM   Virtual Visit via Video Note   I, Cathlyn Parsons, connected with  Derek Decker  ([id number], 12/18/1985) on 11/13/21 at  1:15 PM EST by a video-enabled telemedicine application and verified that I am speaking with the correct person using two identifiers.  Location: Patient: Virtual Visit Location Patient: Other: work Provider: Pharmacist, community: Home Office   I discussed the limitations of evaluation and management by  telemedicine and the availability of in person appointments. The patient expressed understanding and agreed to proceed.    History of Present Illness: Derek Decker is a 36 y.o. who identifies as a male who was assigned male at birth, and is being seen today for left-sided throat pain.  He reports he had a cold in December and was prescribed Flonase which helped some and his cold eventually did go away.  He continues to use the Flonase, however he still has significant congestion and drainage most of the time.  For the last couple of weeks his nasal drainage has been green, and he has facial pressure in his left maxillary sinus.  He denies feeling like he has swelling in his throat or that his tonsil is protruding into his throat.  He does say it hurts to swallow but not because there is any difficulty in doing so, just because it hurts.  He denies fever or chills.  He denies cough.  HPI: HPI  Problems:  Patient Active Problem List   Diagnosis Date Noted   Elevated LFTs 06/27/2020   Gastroesophageal reflux disease 06/27/2020   Hepatic steatosis 04/14/2020   Hyperlipidemia associated with type 2 diabetes mellitus (HCC) 04/13/2020   Hypertension associated with type 2 diabetes mellitus (HCC) 07/10/2019   Morbid obesity (HCC) 07/10/2019   Diabetes mellitus (HCC) 02/26/2019  Allergies: No Known Allergies Medications:  Current Outpatient Medications:    amoxicillin-clavulanate (AUGMENTIN) 875-125 MG tablet, Take 1 tablet by mouth 2 (two) times daily., Disp: 14 tablet, Rfl: 0   atorvastatin (LIPITOR) 20 MG tablet, Take 1 tablet (20 mg total) by mouth daily., Disp: 90 tablet, Rfl: 3   Continuous Blood Gluc Sensor (FREESTYLE LIBRE 2 SENSOR) MISC, USE TO TEST BLOOD SUGAR 6 TIMES DAILY, Disp: 6 each, Rfl: 2   fluticasone (FLONASE) 50 MCG/ACT nasal spray, SPRAY 2 SPRAYS INTO EACH NOSTRIL EVERY DAY, Disp: 48 mL, Rfl: 1   glucose blood (ONETOUCH VERIO) test strip, USE 1 STRIP TO CHECK GLUCOSE four times  a day, Disp: 100 each, Rfl: 2   lisinopril (ZESTRIL) 40 MG tablet, Take 1 tablet (40 mg total) by mouth daily., Disp: 90 tablet, Rfl: 3   metFORMIN (GLUCOPHAGE-XR) 500 MG 24 hr tablet, Take 1 tablet (500 mg total) by mouth daily with breakfast., Disp: 90 tablet, Rfl: 3   omeprazole (PRILOSEC) 20 MG capsule, TAKE 1 CAPSULE BY MOUTH EVERY DAY BEFORE BREAKFAST, Disp: 30 capsule, Rfl: 2   ondansetron (ZOFRAN-ODT) 4 MG disintegrating tablet, Take 1 tablet (4 mg total) by mouth every 8 (eight) hours as needed for nausea or vomiting., Disp: 20 tablet, Rfl: 0   tirzepatide (MOUNJARO) 10 MG/0.5ML Pen, Inject 10 mg into the skin once a week., Disp: 6 mL, Rfl: 2  Observations/Objective: Patient is well-developed, well-nourished in no acute distress.  Resting comfortably  at work.  Head is normocephalic, atraumatic.  No labored breathing.  Speech is clear and coherent with logical content.  Patient is alert and oriented at baseline.    Assessment and Plan: 1. Acute bacterial sinusitis  Prescribed Augmentin.  Reviewed supportive care measures including adding use Mucinex and nasal saline spray.   Follow Up Instructions: I discussed the assessment and treatment plan with the patient. The patient was provided an opportunity to ask questions and all were answered. The patient agreed with the plan and demonstrated an understanding of the instructions.  A copy of instructions were sent to the patient via MyChart unless otherwise noted below.    The patient was advised to call back or seek an in-person evaluation if the symptoms worsen or if the condition fails to improve as anticipated.  Time:  I spent 8 minutes with the patient via telehealth technology discussing the above problems/concerns.    Cathlyn Parsons, NP

## 2021-11-13 NOTE — Patient Instructions (Signed)
°  Derek Decker, thank you for joining Carvel Getting, NP for today's virtual visit.  While this provider is not your primary care provider (PCP), if your PCP is located in our provider database this encounter information will be shared with them immediately following your visit.  Consent: (Patient) Derek Decker provided verbal consent for this virtual visit at the beginning of the encounter.  Current Medications:  Current Outpatient Medications:    amoxicillin-clavulanate (AUGMENTIN) 875-125 MG tablet, Take 1 tablet by mouth 2 (two) times daily., Disp: 14 tablet, Rfl: 0   atorvastatin (LIPITOR) 20 MG tablet, Take 1 tablet (20 mg total) by mouth daily., Disp: 90 tablet, Rfl: 3   Continuous Blood Gluc Sensor (FREESTYLE LIBRE 2 SENSOR) MISC, USE TO TEST BLOOD SUGAR 6 TIMES DAILY, Disp: 6 each, Rfl: 2   fluticasone (FLONASE) 50 MCG/ACT nasal spray, SPRAY 2 SPRAYS INTO EACH NOSTRIL EVERY DAY, Disp: 48 mL, Rfl: 1   glucose blood (ONETOUCH VERIO) test strip, USE 1 STRIP TO CHECK GLUCOSE four times a day, Disp: 100 each, Rfl: 2   lisinopril (ZESTRIL) 40 MG tablet, Take 1 tablet (40 mg total) by mouth daily., Disp: 90 tablet, Rfl: 3   metFORMIN (GLUCOPHAGE-XR) 500 MG 24 hr tablet, Take 1 tablet (500 mg total) by mouth daily with breakfast., Disp: 90 tablet, Rfl: 3   omeprazole (PRILOSEC) 20 MG capsule, TAKE 1 CAPSULE BY MOUTH EVERY DAY BEFORE BREAKFAST, Disp: 30 capsule, Rfl: 2   ondansetron (ZOFRAN-ODT) 4 MG disintegrating tablet, Take 1 tablet (4 mg total) by mouth every 8 (eight) hours as needed for nausea or vomiting., Disp: 20 tablet, Rfl: 0   tirzepatide (MOUNJARO) 10 MG/0.5ML Pen, Inject 10 mg into the skin once a week., Disp: 6 mL, Rfl: 2   Medications ordered in this encounter:  Meds ordered this encounter  Medications   amoxicillin-clavulanate (AUGMENTIN) 875-125 MG tablet    Sig: Take 1 tablet by mouth 2 (two) times daily.    Dispense:  14 tablet    Refill:  0     *If you need refills  on other medications prior to your next appointment, please contact your pharmacy*  Follow-Up: Call back or seek an in-person evaluation if the symptoms worsen or if the condition fails to improve as anticipated.  Other Instructions Keep using your Flonase nasal spray.  I suggest you also take Mucinex and use saline nasal spray to help get the congestion to drain.  Finish all of the antibiotics as prescribed.  Drink lots of liquids.  If this does not take care of your problem, you will need to be seen in person to have it checked.   If you have been instructed to have an in-person evaluation today at a local Urgent Care facility, please use the link below. It will take you to a list of all of our available [facility], including address, phone number and hours of operation. Please do not delay care.  [facility]  If you or a family member do not have a primary care provider, use the link below to schedule a visit and establish care. When you choose a [facility] primary care physician or advanced practice provider, you gain a long-term partner in health. Find a Primary Care Provider  Learn more about [facility]'s in-office and virtual care options: [facility]

## 2021-11-17 ENCOUNTER — Ambulatory Visit (INDEPENDENT_AMBULATORY_CARE_PROVIDER_SITE_OTHER): Payer: 59 | Admitting: Family

## 2021-11-17 ENCOUNTER — Encounter: Payer: Self-pay | Admitting: Family

## 2021-11-17 VITALS — BP 135/87 | HR 105 | Temp 97.2°F | Ht 79.0 in | Wt 338.4 lb

## 2021-11-17 DIAGNOSIS — J029 Acute pharyngitis, unspecified: Secondary | ICD-10-CM | POA: Diagnosis not present

## 2021-11-17 LAB — RAPID STREP SCREEN (MED CTR MEBANE ONLY): Strep Gp A Ag, IA W/Reflex: NEGATIVE

## 2021-11-17 LAB — CULTURE, GROUP A STREP

## 2021-11-17 MED ORDER — CETIRIZINE HCL 10 MG PO TABS: 10.0000 mg | ORAL_TABLET | Freq: Every day | ORAL | 11 refills | Status: DC

## 2021-11-17 MED ORDER — FLUTICASONE PROPIONATE 50 MCG/ACT NA SUSP: 2.0000 | Freq: Every day | NASAL | 6 refills | Status: DC

## 2021-11-17 MED ORDER — LIDOCAINE VISCOUS HCL 2 % MT SOLN: 15.0000 mL | OROMUCOSAL | 0 refills | Status: DC | PRN

## 2021-11-17 NOTE — Progress Notes (Signed)
Subjective:    Patient ID: Derek Decker, male    DOB: 1986/08/12, 36 y.o.   MRN: [id number]  Chief Complaint  Patient presents with   Sore Throat   Pt presents to the office today with left sore throat that started on 11/11/21. He did a virtual visit and received Augmentin BID that has not helped.  Sore Throat  This is a new problem. The current episode started in the past 7 days. The problem has been gradually worsening. The pain is worse on the left side. There has been no fever. The pain is at a severity of 5/10. Associated symptoms include coughing ("a little), swollen glands and trouble swallowing. Pertinent negatives include no congestion, ear pain, headaches, neck pain or shortness of breath. He has had no exposure to strep or mono. He has tried NSAIDs (Augmentin) for the symptoms. The treatment provided no relief.     Review of Systems  HENT:  Positive for trouble swallowing. Negative for congestion and ear pain.   Respiratory:  Positive for cough ("a little). Negative for shortness of breath.   Musculoskeletal:  Negative for neck pain.  Neurological:  Negative for headaches.  All other systems reviewed and are negative.     Objective:   Physical Exam Vitals reviewed.  Constitutional:      General: He is not in acute distress.    Appearance: He is well-developed. He is obese.  HENT:     Head: Normocephalic.     Right Ear: Tympanic membrane and external ear normal.     Left Ear: Tympanic membrane and external ear normal.     Nose: No congestion.     Mouth/Throat:     Pharynx: Posterior oropharyngeal erythema present.     Tonsils: No tonsillar exudate.  Eyes:     General:        Right eye: No discharge.        Left eye: No discharge.     Pupils: Pupils are equal, round, and reactive to light.  Neck:     Thyroid: No thyromegaly.  Cardiovascular:     Rate and Rhythm: Normal rate and regular rhythm.     Heart sounds: Normal heart sounds. No murmur heard. Pulmonary:      Effort: Pulmonary effort is normal. No respiratory distress.     Breath sounds: Normal breath sounds. No wheezing.  Abdominal:     General: Bowel sounds are normal. There is no distension.     Palpations: Abdomen is soft.     Tenderness: There is no abdominal tenderness.  Musculoskeletal:        General: No tenderness. Normal range of motion.     Cervical back: Normal range of motion and neck supple.  Skin:    General: Skin is warm and dry.     Findings: No erythema or rash.  Neurological:     Mental Status: He is alert and oriented to person, place, and time.     Cranial Nerves: No cranial nerve deficit.     Deep Tendon Reflexes: Reflexes are normal and symmetric.  Psychiatric:        Behavior: Behavior normal.        Thought Content: Thought content normal.        Judgment: Judgment normal.      BP 135/87    Pulse (!) 105    Temp (!) 97.2 F (36.2 C) (Temporal)    Ht 6\' 7"  (2.007 m)    Wt )  338 lb 6.4 oz (153.5 kg)    BMI 38.12 kg/m      Assessment & Plan:  Derek Decker comes in today with chief complaint of Sore Throat   Diagnosis and orders addressed:  1. Sore throat - Rapid Strep Screen ([facility] ONLY)  2. Acute pharyngitis, unspecified etiology - Take meds as prescribed - Use a cool mist humidifier  -Use saline nose sprays frequently -Force fluids -For any cough or congestion  Use plain Mucinex- regular strength or max strength is fine -For fever or aces or pains- take tylenol or ibuprofen. -Throat lozenges if help -New toothbrush in 3 days - cetirizine (ZYRTEC) 10 MG tablet; Take 1 tablet (10 mg total) by mouth daily.  Dispense: 30 tablet; Refill: 11 - fluticasone (FLONASE) 50 MCG/ACT nasal spray; Place 2 sprays into both nostrils daily.  Dispense: 16 g; Refill: 6 - lidocaine (XYLOCAINE) 2 % solution; Use as directed 15 mLs in the mouth or throat as needed for mouth pain.  Dispense: 100 mL; Refill: 0   Jannifer Rodney, FNP

## 2021-11-17 NOTE — Patient Instructions (Signed)
Pharyngitis °Pharyngitis is inflammation of the throat (pharynx). It is a very common cause of sore throat. Pharyngitis can be caused by a bacteria, but it is usually caused by a virus. Most cases of pharyngitis get better on their own without treatment. °What are the causes? °This condition may be caused by: °Infection by viruses (viral). Viral pharyngitis spreads easily from person to person (is contagious) through coughing, sneezing, and sharing of personal items or utensils such as cups, forks, spoons, and toothbrushes. °Infection by bacteria (bacterial). Bacterial pharyngitis may be spread by touching the nose or face after coming in contact with the bacteria, or through close contact, such as kissing. °Allergies. Allergies can cause buildup of mucus in the throat (post-nasal drip), leading to inflammation and irritation. Allergies can also cause blocked nasal passages, forcing breathing through the mouth, which dries and irritates the throat. °What increases the risk? °You are more likely to develop this condition if: °You are 5-24 years old. °You are exposed to crowded environments such as daycare, school, or dormitory living. °You live in a cold climate. °You have a weakened disease-fighting (immune) system. °What are the signs or symptoms? °Symptoms of this condition vary by the cause. Common symptoms of this condition include: °Sore throat. °Fatigue. °Low-grade fever. °Stuffy nose (nasal congestion) and cough. °Headache. °Other symptoms may include: °Glands in the neck (lymph nodes) that are swollen. °Skin rashes. °Plaque-like film on the throat or tonsils. This is often a symptom of bacterial pharyngitis. °Vomiting. °Red, itchy eyes (conjunctivitis). °Loss of appetite. °Joint pain and muscle aches. °Enlarged tonsils. °How is this diagnosed? °This condition may be diagnosed based on your medical history and a physical exam. Your health care provider will ask you questions about your illness and your  symptoms. °A swab of your throat may be done to check for bacteria (rapid strep test). Other lab tests may also be done, depending on the suspected cause, but these are rare. °How is this treated? °Many times, treatment is not needed for this condition. Pharyngitis usually gets better in 3-4 days without treatment. °Bacterial pharyngitis may be treated with antibiotic medicines. °Follow these instructions at home: °Medicines °Take over-the-counter and prescription medicines only as told by your health care provider. °If you were prescribed an antibiotic medicine, take it as told by your health care provider. Do not stop taking the antibiotic even if you start to feel better. °Use throat sprays to soothe your throat as told by your health care provider. °Children can get pharyngitis. Do not give your child aspirin because of the association with Reye's syndrome. °Managing pain °To help with pain, try: °Sipping warm liquids, such as broth, herbal tea, or warm water. °Eating or drinking cold or frozen liquids, such as frozen ice pops. °Gargling with a mixture of salt and water 3-4 times a day or as needed. To make salt water, completely dissolve ½-1 tsp (3-6 g) of salt in 1 cup (237 mL) of warm water. °Sucking on hard candy or throat lozenges. °Putting a cool-mist humidifier in your bedroom at night to moisten the air. °Sitting in the bathroom with the door closed for 5-10 minutes while you run hot water in the shower. ° °General instructions ° °Do not use any products that contain nicotine or tobacco. These products include cigarettes, chewing tobacco, and vaping devices, such as e-cigarettes. If you need help quitting, ask your health care provider. °Rest as told by your health care provider. °Drink enough fluid to keep your urine pale yellow. °How   is this prevented? °To help prevent becoming infected or spreading infection: °Wash your hands often with soap and water for at least 20 seconds. If soap and water are not  available, use hand sanitizer. °Do not touch your eyes, nose, or mouth with unwashed hands, and wash hands after touching these areas. °Do not share cups or eating utensils. °Avoid close contact with people who are sick. °Contact a health care provider if: °You have large, tender lumps in your neck. °You have a rash. °You cough up green, yellow-brown, or bloody mucus. °Get help right away if: °Your neck becomes stiff. °You drool or are unable to swallow liquids. °You cannot drink or take medicines without vomiting. °You have severe pain that does not go away, even after you take medicine. °You have trouble breathing, and it is not caused by a stuffy nose. °You have new pain and swelling in your joints such as the knees, ankles, wrists, or elbows. °These symptoms may represent a serious problem that is an emergency. Do not wait to see if the symptoms will go away. Get medical help right away. Call your local emergency services (911 in the [place] Do not drive yourself to the hospital. °Summary °Pharyngitis is redness, pain, and swelling (inflammation) of the throat (pharynx). °While pharyngitis can be caused by a bacteria, the most common causes are viral. °Most cases of pharyngitis get better on their own without treatment. °Bacterial pharyngitis is treated with antibiotic medicines. °This information is not intended to replace advice given to you by your health care provider. Make sure you discuss any questions you have with your health care provider. °Document Revised: 12/28/2020 Document Reviewed: 12/28/2020 °Elsevier Patient Education © 2022 [employer] ° °

## 2021-12-09 ENCOUNTER — Other Ambulatory Visit: Payer: Self-pay | Admitting: Family

## 2021-12-09 DIAGNOSIS — E119 Type 2 diabetes mellitus without complications: Secondary | ICD-10-CM

## 2021-12-11 NOTE — Telephone Encounter (Signed)
Mounjaro changed to ozempic 2 mg per insurance.

## 2021-12-13 ENCOUNTER — Other Ambulatory Visit: Payer: Self-pay | Admitting: Family

## 2021-12-13 DIAGNOSIS — E119 Type 2 diabetes mellitus without complications: Secondary | ICD-10-CM

## 2021-12-19 ENCOUNTER — Other Ambulatory Visit: Payer: Self-pay | Admitting: Family

## 2021-12-19 DIAGNOSIS — E119 Type 2 diabetes mellitus without complications: Secondary | ICD-10-CM

## 2021-12-26 ENCOUNTER — Telehealth: Payer: Self-pay | Admitting: Family

## 2021-12-26 NOTE — Telephone Encounter (Signed)
Spoke with patient, appointment scheduled on 12/28/21 at 9:40 am with [facility]. ?

## 2021-12-28 ENCOUNTER — Other Ambulatory Visit: Payer: Self-pay | Admitting: Family

## 2021-12-28 ENCOUNTER — Encounter: Payer: Self-pay | Admitting: Family

## 2021-12-28 ENCOUNTER — Ambulatory Visit (INDEPENDENT_AMBULATORY_CARE_PROVIDER_SITE_OTHER): Payer: 59 | Admitting: Family

## 2021-12-28 VITALS — BP 140/90 | HR 97 | Temp 97.5°F | Ht 79.0 in | Wt 334.0 lb

## 2021-12-28 DIAGNOSIS — Z09 Encounter for follow-up examination after completed treatment for conditions other than malignant neoplasm: Secondary | ICD-10-CM

## 2021-12-28 DIAGNOSIS — G8929 Other chronic pain: Secondary | ICD-10-CM | POA: Diagnosis not present

## 2021-12-28 DIAGNOSIS — M25511 Pain in right shoulder: Secondary | ICD-10-CM | POA: Diagnosis not present

## 2021-12-28 MED ORDER — DICLOFENAC SODIUM 3 % EX GEL: 1.0000 "application " | Freq: Two times a day (BID) | CUTANEOUS | 2 refills | Status: DC

## 2021-12-28 MED ORDER — NAPROXEN SODIUM 550 MG PO TABS: 550.0000 mg | ORAL_TABLET | Freq: Two times a day (BID) | ORAL | 2 refills | Status: DC

## 2021-12-28 NOTE — Progress Notes (Signed)
? ?Subjective:  ? ? Patient ID: Derek Decker, male    DOB: 1985/11/29, 36 y.o.   MRN: [id number] ? ?Chief Complaint  ?Patient presents with  ? Hospitalization Follow-up  ? ?PT presents to the office today for ED follow up. He went to the ED on 12/24/21 with right shoulder pain. He had a negative X-ray. He reports his shoulder has been hurting for over a year and comes and goes. States when it flared up this time he could not life his arm.  ? ?The ED recommended a MRI, but he states he does not believe he could tolerate being in that tight of a spot. He has never seen Ortho.  ? ?He was given prednisone and naprosyn. He reports this has not helped. He is a diabetic, but states his glucose has been stable around 130's in AM.  ?Shoulder Pain  ?The pain is present in the right shoulder. This is a chronic problem. The current episode started more than 1 year ago. There has been no history of extremity trauma. The problem occurs intermittently. The problem has been waxing and waning. The quality of the pain is described as aching. The pain is at a severity of 6/10. The pain is moderate. Associated symptoms include a limited range of motion. Pertinent negatives include no numbness or tingling. The symptoms are aggravated by activity. He has tried rest and NSAIDS for the symptoms. The treatment provided mild relief.  ?Hypertension ?This is a chronic problem. The current episode started more than 1 year ago. The problem has been waxing and waning since onset. The problem is uncontrolled. Associated symptoms include malaise/fatigue. Pertinent negatives include no peripheral edema or shortness of breath.  ?Diabetes ?He presents for his follow-up diabetic visit. He has type 2 diabetes mellitus. Risk factors for coronary artery disease include dyslipidemia, diabetes mellitus, male sex, hypertension and sedentary lifestyle. His overall blood glucose range is 130-140 mg/dl.  ? ? ? ?Review of Systems  ?Constitutional:  Positive for  malaise/fatigue.  ?Respiratory:  Negative for shortness of breath.   ?Neurological:  Negative for tingling and numbness.  ?All other systems reviewed and are negative. ? ?   ?Objective:  ? Physical Exam ?Vitals reviewed.  ?Constitutional:   ?   General: He is not in acute distress. ?   Appearance: He is well-developed.  ?HENT:  ?   Head: Normocephalic.  ?   Right Ear: Tympanic membrane normal. There is no impacted cerumen.  ?   Left Ear: Tympanic membrane normal.  ?Eyes:  ?   General:     ?   Right eye: No discharge.     ?   Left eye: No discharge.  ?   Pupils: Pupils are equal, round, and reactive to light.  ?Neck:  ?   Thyroid: No thyromegaly.  ?Cardiovascular:  ?   Rate and Rhythm: Normal rate and regular rhythm.  ?   Heart sounds: Normal heart sounds. No murmur heard. ?Pulmonary:  ?   Effort: Pulmonary effort is normal. No respiratory distress.  ?   Breath sounds: Normal breath sounds. No wheezing.  ?Abdominal:  ?   General: Bowel sounds are normal. There is no distension.  ?   Palpations: Abdomen is soft.  ?   Tenderness: There is no abdominal tenderness.  ?Musculoskeletal:     ?   General: Tenderness present.  ?   Cervical back: Normal range of motion and neck supple.  ?   Comments: Pain in shoulder with  flexion or extension  ?Skin: ?   General: Skin is warm and dry.  ?   Findings: No erythema or rash.  ?Neurological:  ?   Mental Status: He is alert and oriented to person, place, and time.  ?   Cranial Nerves: No cranial nerve deficit.  ?   Deep Tendon Reflexes: Reflexes are normal and symmetric.  ?Psychiatric:     ?   Behavior: Behavior normal.     ?   Thought Content: Thought content normal.     ?   Judgment: Judgment normal.  ? ? ? ? ?BP (!) 144/80   Pulse 97   Temp (!) 97.5 ?F (36.4 ?C) (Temporal)   Ht 6\' 7"  (2.007 m)   Wt (!) 334 lb (151.5 kg)   BMI 37.63 kg/m?  ? ?   ?Assessment & Plan:  ?Derek Decker comes in today with chief complaint of Hospitalization Follow-up ? ? ?Diagnosis and orders  addressed: ? ?1. Chronic right shoulder pain ?- Ambulatory referral to Orthopedic Surgery ?- Ambulatory referral to Physical Therapy ?- naproxen sodium (ANAPROX) 550 MG tablet; Take 1 tablet (550 mg total) by mouth 2 (two) times daily.  Dispense: 60 tablet; Refill: 2 ?- Diclofenac Sodium 3 % GEL; Apply 1 application. topically 2 (two) times daily.  Dispense: 100 g; Refill: 2 ? ?2. Hospital discharge follow-up ? ? ?Rest ?ROM exercises  ?Continue naproxen BID with food ?Diclofenac gel  ?Referral to PT and Ortho ?Strict low carb diet  ? ?Earma Reading, FNP ? ? ? ?

## 2021-12-28 NOTE — Patient Instructions (Signed)
Rotator Cuff Tendinitis Rotator cuff tendinitis is inflammation of the tendons in the rotator cuff. Tendons are tough, cord-like bands that connect muscle to bone. The rotator cuff includes all of the muscles and tendons that connect the arm to the shoulder. The rotator cuff holds the head of the humerus, or the upper arm bone, in the cup of the shoulder blade (scapula). This condition can lead to a long-term or chronic tear. The tear may be partial or complete. What are the causes? This condition is usually caused by overusing the rotator cuff. What increases the risk? This condition is more likely to develop in athletes and workers who frequently use their shoulder or reach over their heads. This can include activities such as: Tennis. Baseball or softball. Swimming. Construction work. Painting. What are the signs or symptoms? Symptoms of this condition include: Pain that spreads (radiates) from the shoulder to the upper arm. Swelling and tenderness in front of the shoulder. Pain when reaching, pulling, or lifting the arm above the head. Pain when lowering the arm from above the head. Minor pain in the shoulder when resting. Increased pain in the shoulder at night. Difficulty placing the arm behind the back. How is this diagnosed? This condition is diagnosed with a physical exam and medical history. Tests may also be done, including: X-rays. MRI. Ultrasound. CT with or without contrast. How is this treated? Treatment for this condition depends on the severity of the condition. In less severe cases, treatment may include: Rest. This may be done with a sling that holds the shoulder still (immobilization). Your health care provider may also recommend avoiding activities that involve lifting your arm over your head. Icing the shoulder. Anti-inflammatory medicines, such as aspirin or ibuprofen. In more severe cases, treatment may include: Physical therapy. Steroid  injections. Surgery. Follow these instructions at home: If you have a sling: Wear the sling as told by your health care provider. Remove it only as told by your health care provider. Loosen it if your fingers tingle, become numb, or turn cold and blue. Keep it clean. If the sling is not waterproof: Do not let it get wet. Cover it with a watertight covering when you take a bath or shower. Managing pain, stiffness, and swelling  If directed, put ice on the injured area. To do this: If you have a removable sling, remove it as told by your health care provider. Put ice in a plastic bag. Place a towel between your skin and the bag. Leave the ice on for 20 minutes, 2-3 times a day. Move your fingers often to reduce stiffness and swelling. Raise (elevate) the injured area above the level of your heart while you are lying down. Find a comfortable sleeping position, or sleep in a recliner, if available. Activity Rest your shoulder as told by your health care provider. Ask your health care provider when it is safe to drive if you have a sling on your arm. Return to your normal activities as told by your health care provider. Ask your health care provider what activities are safe for you. Do any exercises or stretches as told by your health care provider or physical therapist. If you do repetitive overhead tasks, take small breaks in between and include stretching exercises as told by your health care provider. General instructions Do not use any products that contain nicotine or tobacco, such as cigarettes, e-cigarettes, and chewing tobacco. These can delay healing. If you need help quitting, ask your health care provider. Take   over-the-counter and prescription medicines only as told by your health care provider. Keep all follow-up visits as told by your health care provider. This is important. Contact a health care provider if: Your pain gets worse. You have new pain in your arm, hands, or  fingers. Your pain is not relieved with medicine or does not get better after 6 weeks of treatment. You have crackling sensations when moving your shoulder in certain directions. You hear a snapping sound after using your shoulder, followed by severe pain and weakness. Get help right away if: Your arm, hand, or fingers are numb or tingling. Your arm, hand, or fingers are swollen or painful or they turn white or blue. Summary Rotator cuff tendinitis is inflammation of the tendons in the rotator cuff. Tendons are tough, cord-like bands that connect muscle to bone. This condition is usually caused by overusing the rotator cuff, which includes all of the muscles and tendons that connect the arm to the shoulder. This condition is more likely to develop in athletes and workers who frequently use their shoulder or reach over their heads. Treatment generally includes rest, anti-inflammatory medicines, and icing. In some cases, physical therapy and steroid injections may be needed. In severe cases, surgery may be needed. This information is not intended to replace advice given to you by your health care provider. Make sure you discuss any questions you have with your health care provider. Document Revised: 07/06/2019 Document Reviewed: 07/06/2019 Elsevier Patient Education  2022 [employer]  

## 2022-01-12 ENCOUNTER — Encounter: Payer: Self-pay | Admitting: Orthopedic Surgery

## 2022-01-12 ENCOUNTER — Ambulatory Visit (INDEPENDENT_AMBULATORY_CARE_PROVIDER_SITE_OTHER): Payer: 59 | Admitting: Orthopedic Surgery

## 2022-01-12 VITALS — BP 148/98 | HR 91 | Ht 79.0 in | Wt 337.0 lb

## 2022-01-12 DIAGNOSIS — M7581 Other shoulder lesions, right shoulder: Secondary | ICD-10-CM | POA: Diagnosis not present

## 2022-01-12 NOTE — Patient Instructions (Signed)
Instructions Following Joint Injections ? ?In clinic today, you received an injection in one of your joints (sometimes more than one).  Occasionally, you can have some pain at the injection site, this is normal.  You can place ice at the injection site, or take over-the-counter medications such as Tylenol (acetaminophen) or Advil (ibuprofen).  Please follow all directions listed on the bottle. ? ?If your joint (knee or shoulder) becomes swollen, red or very painful, please contact the clinic for additional assistance.  ? ?Two medications were injected, including lidocaine and a steroid (often referred to as cortisone).  Lidocaine is effective almost immediately but wears off quickly.  However, the steroid can take a few days to improve your symptoms.  In some cases, it can make your pain worse for a couple of days.  Do not be concerned if this happens as it is common.  You can apply ice or take some over-the-counter medications as needed.  ? ? ? ? ?Rotator Cuff Tear/Tendinitis Rehab  ? ?Ask your health care provider which exercises are safe for you. Do exercises exactly as told by your health care provider and adjust them as directed. It is normal to feel mild stretching, pulling, tightness, or discomfort as you do these exercises. Stop right away if you feel sudden pain or your pain gets worse. Do not begin these exercises until told by your health care provider. ?Stretching and range-of-motion exercises ? ?These exercises warm up your muscles and joints and improve the movement and flexibility of your shoulder. These exercises also help to relieve pain. ? ?Shoulder pendulum ?In this exercise, you let the injured arm dangle toward the floor and then swing it like a clock pendulum. ?Stand near a table or counter that you can hold onto for balance. ?Bend forward at the waist and let your left / right arm hang straight down. Use your other arm to support you and help you stay balanced. ?Relax your left / right arm and  shoulder muscles, and move your hips and your trunk so your left / right arm swings freely. Your arm should swing because of the motion of your body, not because you are using your arm or shoulder muscles. ?Keep moving your hips and trunk so your arm swings in the following directions, as told by your health care provider: ?Side to side. ?Forward and backward. ?In clockwise and counterclockwise circles. ?Slowly return to the starting position. ?Repeat 10 times, or for 10 seconds per direction. Complete this exercise 2-3 times a day. ?  ?   ?Shoulder flexion, seated ?This exercise is sometimes called table slides. In this exercise, you raise your arm in front of your body until you feel a stretch in your injured shoulder. ?Sit in a stable chair so your left / right forearm can rest on a flat surface. Your elbow should rest at a height that keeps your upper arm next to your body. ?Keeping your left / right shoulder relaxed, lean forward at the waist and let your hand slide forward (flexion). Stop when you feel a stretch in your shoulder, or when you reach the angle that is recommended by your health care provider. ?Hold for 5 seconds. ?Slowly return to the starting position. ?Repeat 10 times. Complete this exercise 1-2  times a day. ?      ?Shoulder flexion, standing ?In this exercise, you raise your arm in front of your body (flexion) until you feel a stretch in your injured shoulder. ?Stand and hold a broomstick,   a cane, or a similar object. Place your hands a little more than shoulder-width apart on the object. Your left / right hand should be palm-up, and your other hand should be palm-down. ?Keep your elbow straight and your shoulder muscles relaxed. Push the stick up with your healthy arm to raise your left / right arm in front of your body, and then over your head until you feel a stretch in your shoulder. ?Avoid shrugging your shoulder while you raise your arm. Keep your shoulder blade tucked down toward the  middle of your back. ?Keep your left / right shoulder muscles relaxed. ?Hold for 10 seconds. ?Slowly return to the starting position. ?Repeat 10 times. Complete this exercise 1-2 times a day. ?  ?   ?Shoulder abduction, active-assisted ?You will need a stick, broom handle, or similar object to help you (assist) in doing this exercise. ?Lie on your back. This is the supine position. Hold a broomstick, a cane, or a similar object. ?Place your hands a little more than shoulder-width apart on the object. Your left / right hand should be palm-up, and your other hand should be palm-down. ?Keeping your shoulder relaxed, push the stick to raise your left / right arm out to your side (abduction) and then over your head. Use your other hand to help move the stick. Stop when you feel a stretch in your shoulder, or when you reach the angle that is recommended by your health care provider. ?Avoid shrugging your shoulder while you raise your arm. Keep your shoulder blade tucked down toward the middle of your back. ?Hold for 10 seconds. ?Slowly return to the starting position. ?Repeat 10 times. Complete this exercise 1-2 times a day. ?  ?   ?Shoulder flexion, active-assisted ?Lie on your back. You may bend your knees for comfort. ?Hold a broomstick, a cane, or a similar object so that your hands are about shoulder-width apart. Your palms should face toward your feet. ?Raise your left / right arm over your head, then behind your head toward the floor (flexion). Use your other hand to help you do this (active-assisted). Stop when you feel a gentle stretch in your shoulder, or when you reach the angle that is recommended by your health care provider. ?Hold for 10 seconds. ?Use the stick and your other arm to help you return your left / right arm to the starting position. ?Repeat 10 times. Complete this exercise 1-2 times a day. ?  ?   ?External rotation ?Sit in a stable chair without armrests, or stand up. ?Tuck a soft object, such  as a folded towel or a small ball, under your left / right upper arm. ?Hold a broomstick, a cane, or a similar object with your palms face-down, toward the floor. Bend your elbows to a 90-degree angle (right angle), and keep your hands about shoulder-width apart. ?Straighten your healthy arm and push the stick across your body, toward your left / right side. Keep your left / right arm bent. This will rotate your left / right forearm away from your body (external rotation). ?Hold for 10 seconds. ?Slowly return to the starting position. ?Repeat 10 times. Complete this exercise 1-2 times a day.  ?   ? ? ? ?Strengthening exercises ?These exercises build strength and endurance in your shoulder. Endurance is the ability to use your muscles for a long time, even after they get tired. Do not start doing these exercises until your health care provider approves. ?Shoulder flexion, isometric ?Stand   or sit in a doorway, facing the door frame. ?Keep your left / right arm straight and make a gentle fist with your hand. Place your fist against the door frame. Only your fist should be touching the frame. Keep your upper arm at your side. ?Gently press your fist against the door frame, as if you are trying to raise your arm above your head (isometric shoulder flexion). ?Avoid shrugging your shoulder while you press your hand into the door frame. Keep your shoulder blade tucked down toward the middle of your back. ?Hold for 10 seconds. ?Slowly release the tension, and relax your muscles completely before you repeat the exercise. ?Repeat 10 times. Complete this exercise 3 times per week. ?     ?Shoulder abduction, isometric ?Stand or sit in a doorway. Your left / right arm should be closest to the door frame. ?Keep your left / right arm straight, and place the back of your hand against the door frame. Only your hand should be touching the frame. Keep the rest of your arm close to your side. ?Gently press the back of your hand against  the door frame, as if you are trying to raise your arm out to the side (isometric shoulder abduction). ?Avoid shrugging your shoulder while you press your hand into the door frame. Keep your shoulder b

## 2022-01-12 NOTE — Progress Notes (Addendum)
New Patient Visit ? ?Assessment: ?OLLY BURKHARD is a 36 y.o. RHD male with the following: ?1. Tendinitis of right rotator cuff ? ?Plan: ?FRANCHESCO DUBRAY has right shoulder pain for the past year, without improvement.  He states that it is getting worse.  No specific injury.  He has good range of motion in his right shoulder.  He also has good strength.  Low concern for rotator cuff tear.  No prior dislocation.  Low likelihood of adhesive capsulitis, although he may be well into the thawing phase.  NSAIDs and oral prednisone have not improved his symptoms.  He is yet to have an injection.  We discussed this is a possibility, and he is interested in proceeding.  I have also provided him with a home exercise program.  He is to pay close attention to his symptoms, specifically how much resolution and for how long the injection has provided.  All questions were answered, and he is amenable to this plan.  Follow-up as needed. ? ?The patient meets the AMA guidelines for Morbid obesity with a BMI greater than 35 with two or more of the following conditions (diabetes, hypertension, dyslipidemias, obstructive sleep apnea, weight related arthropathy, or stress urinary incontinence).  The patient has been counseled on weight loss.  ? ? ?Procedure note injection - Right shoulder  ?  ?Verbal consent was obtained to inject the right shoulder, subacromial space ?Timeout was completed to confirm the site of injection.   ?The skin was prepped with alcohol and ethyl chloride was sprayed at the injection site.  ?A 21-gauge needle was used to inject 40 mg of Depo-Medrol and 1% lidocaine (3 cc) into the subacromial space of the right shoulder using a posterolateral approach.  ?There were no complications.  ?A sterile bandage was applied.  ? ? ?Follow-up: ?No follow-ups on file. ? ?Subjective: ? ?Chief Complaint  ?Patient presents with  ? Shoulder Pain  ?  Rt shoulder pain for over a year getting worse.   ? ? ?History of Present  Illness: ?JARVIN CARDONA is a 36 y.o. male who presents for evaluation of right shoulder pain.  He has had pain in his right shoulder for the past year.  He has never injured his right shoulder.  No recent injury.  No dislocations.  He feels as though his shoulder is getting worse.  The pain in the shoulder is diffuse.  He states it limits his motion.  He has tried diclofenac, oral prednisone and topical diclofenac, all without appreciable improvement in his symptoms.  He has not worked with physical therapy.  He has never had an injection.  Pain is bad enough in the shoulder, he states he cannot lay on his right shoulder.  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 ? ? ?Review of Systems: ?No fevers or chills ?No numbness or tingling ?No chest pain ?No shortness of breath ?No bowel or bladder dysfunction ?No GI distress ?No headaches ? ? ?Medical History: ? ?Past Medical History:  ?Diagnosis Date  ? Diabetes mellitus without complication ([place])   ? Fatty liver   ? HLD (hyperlipidemia)   ? Hypertension   ? ? ?Past Surgical History:  ?Procedure Laterality Date  ? FRACTURE SURGERY Left 2016  ? left foot  ? ? ?Family History  ?Problem Relation Age of Onset  ? Hypertension Mother   ? Hypertension Father   ? Liver disease Neg Hx   ? Colon cancer  Neg Hx   ? ?Social History  ? ?Tobacco Use  ? Smoking status: Former  ?  Packs/day: 0.25  ?  Years: 3.00  ?  Pack years: 0.75  ?  Types: Cigarettes  ? Smokeless tobacco: Never  ?Vaping Use  ? Vaping Use: Never used  ?Substance Use Topics  ? Alcohol use: Not Currently  ? Drug use: No  ? ? ?No Known Allergies ? ?No outpatient medications have been marked as taking for the 01/12/22 encounter (Office Visit) with Mordecai Rasmussen, MD.  ? ? ?Objective: ?BP (!) 148/98   Pulse 91   Ht 6\' 7"  (2.007 m)   Wt (!) 337 lb (152.9 kg)   BMI 37.96 kg/m?  ? ?Physical Exam: ? ?General: Alert and oriented. and No acute distress. ?Gait: Normal gait. ? ?Right  shoulder without deformity.  No redness.  No points of tenderness.  He has full forward flexion with minimal discomfort.  Internal rotation to his lumbar spine, and this causes pain.  30 degrees of external rotation at his side, which is comparable to the contralateral side.  He notes pain with external rotation.  Some discomfort in the empty can testing position.  Strength is 5/5 in the supraspinatus, infraspinatus and subscapularis.  Negative belly press.  Fingers are warm and well-perfused.  Sensation is intact throughout the right hand.  2+ radial pulse. ? ?IMAGING: ?I personally reviewed images previously obtained from the ED ? ?X-rays were previously obtained in the ED.  No acute injuries are noted.  The glenohumeral joint is reduced.  No evidence of advanced degenerative changes. ? ? ?New Medications:  ?No orders of the defined types were placed in this encounter. ? ? ? ? ?Mordecai Rasmussen, MD ? ?01/12/2022 ?9:39 AM ? ? ?

## 2022-01-19 ENCOUNTER — Other Ambulatory Visit: Payer: Self-pay | Admitting: Family

## 2022-01-19 DIAGNOSIS — E119 Type 2 diabetes mellitus without complications: Secondary | ICD-10-CM

## 2022-02-01 ENCOUNTER — Encounter: Payer: Self-pay | Admitting: Family

## 2022-02-01 ENCOUNTER — Ambulatory Visit (INDEPENDENT_AMBULATORY_CARE_PROVIDER_SITE_OTHER): Payer: 59 | Admitting: Family

## 2022-02-01 VITALS — BP 137/89 | HR 85 | Temp 97.9°F | Ht 79.0 in | Wt 338.0 lb

## 2022-02-01 DIAGNOSIS — K219 Gastro-esophageal reflux disease without esophagitis: Secondary | ICD-10-CM | POA: Diagnosis not present

## 2022-02-01 DIAGNOSIS — E1159 Type 2 diabetes mellitus with other circulatory complications: Secondary | ICD-10-CM

## 2022-02-01 DIAGNOSIS — E785 Hyperlipidemia, unspecified: Secondary | ICD-10-CM

## 2022-02-01 DIAGNOSIS — I152 Hypertension secondary to endocrine disorders: Secondary | ICD-10-CM

## 2022-02-01 DIAGNOSIS — E1169 Type 2 diabetes mellitus with other specified complication: Secondary | ICD-10-CM | POA: Diagnosis not present

## 2022-02-01 LAB — BAYER DCA HB A1C WAIVED: HB A1C (BAYER DCA - WAIVED): 6.7 % — ABNORMAL HIGH (ref 4.8–5.6)

## 2022-02-01 MED ORDER — TIRZEPATIDE 12.5 MG/0.5ML ~~LOC~~ SOAJ: 12.5000 mg | SUBCUTANEOUS | 2 refills | Status: DC

## 2022-02-01 NOTE — Progress Notes (Signed)
? ?Subjective:  ? ? Patient ID: Derek Decker, male    DOB: [birth year], 36 y.o.   MRN: [id number] ? ?Chief Complaint  ?Patient presents with  ? Medical Management of Chronic Issues  ?  Shoulder is still bothering him.   ? ?Pt presents to the office today for chronic follow up.  ? ?He is followed by GI for fatty liver and followed by dietitian. He is morbid obese with a BMI of 38 and DM and HTN.  ?  ?He is currently taking Mounjaro 7.5 mg weekly and denies any nausea, vomiting, GERD.  ? ?He continues to have pain in right shoulder of 6 out 10. He saw Ortho and had an injection that mildly helped.  ?Hypertension ?This is a chronic problem. The current episode started more than 1 year ago. The problem has been resolved since onset. The problem is controlled. Pertinent negatives include no blurred vision, malaise/fatigue or peripheral edema. Risk factors for coronary artery disease include dyslipidemia, obesity and sedentary lifestyle. The current treatment provides moderate improvement.  ?Gastroesophageal Reflux ?He reports no belching, no heartburn or no nausea. This is a chronic problem. The current episode started more than 1 year ago. The problem occurs occasionally. Risk factors include obesity. He has tried a PPI for the symptoms. The treatment provided moderate relief.  ?Hyperlipidemia ?This is a chronic problem. The current episode started more than 1 year ago. The problem is controlled. Exacerbating diseases include obesity. Current antihyperlipidemic treatment includes statins. The current treatment provides moderate improvement of lipids. Risk factors for coronary artery disease include dyslipidemia, diabetes mellitus, male sex, hypertension and a sedentary lifestyle.  ?Diabetes ?He presents for his follow-up diabetic visit. He has type 2 diabetes mellitus. Pertinent negatives for diabetes include no blurred vision and no foot paresthesias. Symptoms are stable. Pertinent negatives for diabetic complications  include no heart disease, nephropathy or peripheral neuropathy. Risk factors for coronary artery disease include dyslipidemia, diabetes mellitus, hypertension, male sex and sedentary lifestyle. He is following a generally healthy diet. His overall blood glucose range is 110-130 mg/dl. Eye exam is not current.  ? ? ? ?Review of Systems  ?Constitutional:  Negative for malaise/fatigue.  ?Eyes:  Negative for blurred vision.  ?Gastrointestinal:  Negative for heartburn and nausea.  ?All other systems reviewed and are negative. ? ?   ?Objective:  ? Physical Exam ?Vitals reviewed.  ?Constitutional:   ?   General: He is not in acute distress. ?   Appearance: He is well-developed. He is obese.  ?HENT:  ?   Head: Normocephalic.  ?   Right Ear: Tympanic membrane normal.  ?   Left Ear: Tympanic membrane normal.  ?Eyes:  ?   General:     ?   Right eye: No discharge.     ?   Left eye: No discharge.  ?   Pupils: Pupils are equal, round, and reactive to light.  ?Neck:  ?   Thyroid: No thyromegaly.  ?Cardiovascular:  ?   Rate and Rhythm: Normal rate and regular rhythm.  ?   Heart sounds: Normal heart sounds. No murmur heard. ?Pulmonary:  ?   Effort: Pulmonary effort is normal. No respiratory distress.  ?   Breath sounds: Normal breath sounds. No wheezing.  ?Abdominal:  ?   General: Bowel sounds are normal. There is no distension.  ?   Palpations: Abdomen is soft.  ?   Tenderness: There is no abdominal tenderness.  ?Musculoskeletal:     ?  General: No tenderness. Normal range of motion.  ?   Cervical back: Normal range of motion and neck supple.  ?Skin: ?   General: Skin is warm and dry.  ?   Findings: No erythema or rash.  ?Neurological:  ?   Mental Status: He is alert and oriented to person, place, and time.  ?   Cranial Nerves: No cranial nerve deficit.  ?   Deep Tendon Reflexes: Reflexes are normal and symmetric.  ?Psychiatric:     ?   Behavior: Behavior normal.     ?   Thought Content: Thought content normal.     ?   Judgment:  Judgment normal.  ? ? ? ? ?BP 137/89   Pulse 85   Temp 97.9 ?F (36.6 ?C) (Temporal)   Ht '6\' 7"'  (2.007 m)   Wt (!) 338 lb (153.3 kg)   BMI 38.08 kg/m?  ? ?   ?Assessment & Plan:  ?MAKANI SECKMAN comes in today with chief complaint of Medical Management of Chronic Issues (Shoulder is still bothering him. ) ? ? ?Diagnosis and orders addressed: ? ?1. Gastroesophageal reflux disease, unspecified whether esophagitis present ?- CMP14+EGFR ? ?2. Hypertension associated with type 2 diabetes mellitus ([place]) ?- CMP14+EGFR ? ?3. Morbid obesity ([place]) ?- CMP14+EGFR ? ?4. Hyperlipidemia associated with type 2 diabetes mellitus ([place]) ?- CMP14+EGFR ? ?5. Type 2 diabetes mellitus with other specified complication, without long-term current use of insulin ([place]) ?Will increase Mounjaro to 12.5 mg from 7.5 mg. Pharm can not get 10 mg. ?- tirzepatide (MOUNJARO) 12.5 MG/0.5ML Pen; Inject 12.5 mg into the skin once a week.  Dispense: 6 mL; Refill: 2 ?- Bayer DCA Hb A1c Waived ?- CMP14+EGFR ? ? ?Labs pending ?Health Maintenance reviewed ?Diet and exercise encouraged ? ?Follow up plan: ? 3 months  ? ? ?Evelina Dun, FNP ? ? ? ?

## 2022-02-01 NOTE — Patient Instructions (Signed)

## 2022-02-02 LAB — CMP14+EGFR
ALT: 45 IU/L — ABNORMAL HIGH (ref 0–44)
AST: 28 IU/L (ref 0–40)
Albumin/Globulin Ratio: 2.1 (ref 1.2–2.2)
Albumin: 4.2 g/dL (ref 4.0–5.0)
Alkaline Phosphatase: 67 IU/L (ref 44–121)
BUN/Creatinine Ratio: 7 — ABNORMAL LOW (ref 9–20)
BUN: 8 mg/dL (ref 6–20)
Bilirubin Total: 1 mg/dL (ref 0.0–1.2)
CO2: 26 mmol/L (ref 20–29)
Calcium: 9.3 mg/dL (ref 8.7–10.2)
Chloride: 104 mmol/L (ref 96–106)
Creatinine, Ser: 1.1 mg/dL (ref 0.76–1.27)
Globulin, Total: 2 g/dL (ref 1.5–4.5)
Glucose: 181 mg/dL — ABNORMAL HIGH (ref 70–99)
Potassium: 4.7 mmol/L (ref 3.5–5.2)
Sodium: 142 mmol/L (ref 134–144)
Total Protein: 6.2 g/dL (ref 6.0–8.5)
eGFR: 89 mL/min/{1.73_m2} (ref >59)

## 2022-02-06 ENCOUNTER — Telehealth: Payer: Self-pay | Admitting: Family

## 2022-02-06 NOTE — Telephone Encounter (Signed)
Your request has been n/a ?PA Case: [id number], Status: Closed, Closed Reason Code: FM Product not covered by this plan. Prior Authorization not available., Closed Rationale: [employer] Prior Authorization team is unable to review this product for a coverage determination as the requested medication is not on the patient's formulary. Please reach out to [employer] directly for this request. Thank you in advance.. Questions? Contact [phone]. ?

## 2022-02-06 NOTE — Telephone Encounter (Signed)
(  Key: [id number]) ?Mounjaro 12.5MG /0.5ML pen-injectors ? ?PA in process ?

## 2022-02-11 ENCOUNTER — Other Ambulatory Visit: Payer: Self-pay | Admitting: Family

## 2022-02-11 DIAGNOSIS — K219 Gastro-esophageal reflux disease without esophagitis: Secondary | ICD-10-CM

## 2022-02-11 DIAGNOSIS — E1169 Type 2 diabetes mellitus with other specified complication: Secondary | ICD-10-CM

## 2022-02-11 DIAGNOSIS — E119 Type 2 diabetes mellitus without complications: Secondary | ICD-10-CM

## 2022-02-13 ENCOUNTER — Encounter: Payer: Self-pay | Admitting: Internal Medicine

## 2022-03-05 ENCOUNTER — Other Ambulatory Visit: Payer: Self-pay | Admitting: Family

## 2022-03-05 DIAGNOSIS — E119 Type 2 diabetes mellitus without complications: Secondary | ICD-10-CM

## 2022-03-11 ENCOUNTER — Other Ambulatory Visit: Payer: Self-pay | Admitting: Family

## 2022-03-11 DIAGNOSIS — E1169 Type 2 diabetes mellitus with other specified complication: Secondary | ICD-10-CM

## 2022-03-11 DIAGNOSIS — K219 Gastro-esophageal reflux disease without esophagitis: Secondary | ICD-10-CM

## 2022-03-11 DIAGNOSIS — E119 Type 2 diabetes mellitus without complications: Secondary | ICD-10-CM

## 2022-04-09 ENCOUNTER — Other Ambulatory Visit: Payer: Self-pay | Admitting: Family

## 2022-04-09 DIAGNOSIS — E119 Type 2 diabetes mellitus without complications: Secondary | ICD-10-CM

## 2022-04-12 ENCOUNTER — Telehealth: Payer: Self-pay

## 2022-04-12 NOTE — Telephone Encounter (Signed)
Prior authorization for Derek Decker was submitted to [employer] via fax on 04/11/22.  Waiting on authorization.

## 2022-05-03 ENCOUNTER — Ambulatory Visit (INDEPENDENT_AMBULATORY_CARE_PROVIDER_SITE_OTHER): Payer: 59 | Admitting: Family

## 2022-05-03 ENCOUNTER — Encounter: Payer: Self-pay | Admitting: Family

## 2022-05-03 VITALS — BP 135/81 | HR 95 | Temp 97.2°F | Ht 79.0 in | Wt 323.4 lb

## 2022-05-03 DIAGNOSIS — E1169 Type 2 diabetes mellitus with other specified complication: Secondary | ICD-10-CM

## 2022-05-03 DIAGNOSIS — K219 Gastro-esophageal reflux disease without esophagitis: Secondary | ICD-10-CM | POA: Diagnosis not present

## 2022-05-03 DIAGNOSIS — E1159 Type 2 diabetes mellitus with other circulatory complications: Secondary | ICD-10-CM | POA: Diagnosis not present

## 2022-05-03 DIAGNOSIS — K760 Fatty (change of) liver, not elsewhere classified: Secondary | ICD-10-CM

## 2022-05-03 DIAGNOSIS — I152 Hypertension secondary to endocrine disorders: Secondary | ICD-10-CM

## 2022-05-03 DIAGNOSIS — E785 Hyperlipidemia, unspecified: Secondary | ICD-10-CM

## 2022-05-03 LAB — BAYER DCA HB A1C WAIVED: HB A1C (BAYER DCA - WAIVED): 5.6 % (ref 4.8–5.6)

## 2022-05-03 MED ORDER — FREESTYLE LIBRE 3 SENSOR MISC: 6 refills | Status: DC

## 2022-05-03 NOTE — Progress Notes (Signed)
Subjective:    Patient ID: Derek Decker, male    DOB: 24-Apr-1986, 36 y.o.   MRN: [id number]  Chief Complaint  Patient presents with   Medical Management of Chronic Issues   Pt presents to the office today for chronic follow up.    He is followed by GI for fatty liver and followed by dietitian as needed.  He is morbid obese with a BMI of 36 and DM and HTN.    He is currently taking Mounjaro 7.5 mg weekly and denies any nausea, vomiting, GERD.   He lost 14 lbs over the last 3 months.  Wt Readings from Last 3 Encounters:  05/03/22 (!) 323 lb 6.4 oz (146.7 kg)  02/01/22 (!) 338 lb (153.3 kg)  01/12/22 (!) 337 lb (152.9 kg)    Hypertension This is a chronic problem. The current episode started more than 1 year ago. The problem has been resolved since onset. The problem is controlled. Pertinent negatives include no blurred vision, malaise/fatigue, peripheral edema or shortness of breath. Risk factors for coronary artery disease include dyslipidemia and obesity. The current treatment provides moderate improvement.  Diabetes He presents for his follow-up diabetic visit. He has type 2 diabetes mellitus. Pertinent negatives for diabetes include no blurred vision and no foot paresthesias. Symptoms are stable. Diabetic complications include heart disease. Risk factors for coronary artery disease include dyslipidemia, diabetes mellitus, male sex, hypertension and sedentary lifestyle. He is following a generally healthy diet. His overall blood glucose range is 130-140 mg/dl.  Gastroesophageal Reflux He complains of belching and heartburn. This is a chronic problem. The current episode started more than 1 year ago. The problem occurs rarely. Risk factors include obesity. He has tried a PPI for the symptoms. The treatment provided moderate relief.  Hyperlipidemia This is a chronic problem. The current episode started more than 1 year ago. Exacerbating diseases include obesity. Pertinent negatives  include no shortness of breath. Current antihyperlipidemic treatment includes statins. The current treatment provides moderate improvement of lipids. Risk factors for coronary artery disease include dyslipidemia, diabetes mellitus, hypertension, male sex and a sedentary lifestyle.      Review of Systems  Constitutional:  Negative for malaise/fatigue.  Eyes:  Negative for blurred vision.  Respiratory:  Negative for shortness of breath.   Gastrointestinal:  Positive for heartburn.  All other systems reviewed and are negative.      Objective:   Physical Exam Vitals reviewed.  Constitutional:      General: He is not in acute distress.    Appearance: He is well-developed. He is obese.  HENT:     Head: Normocephalic.     Right Ear: Tympanic membrane normal.     Left Ear: Tympanic membrane normal.  Eyes:     General:        Right eye: No discharge.        Left eye: No discharge.     Pupils: Pupils are equal, round, and reactive to light.  Neck:     Thyroid: No thyromegaly.  Cardiovascular:     Rate and Rhythm: Normal rate and regular rhythm.     Heart sounds: Normal heart sounds. No murmur heard. Pulmonary:     Effort: Pulmonary effort is normal. No respiratory distress.     Breath sounds: Normal breath sounds. No wheezing.  Abdominal:     General: Bowel sounds are normal. There is no distension.     Palpations: Abdomen is soft.     Tenderness: There is  no abdominal tenderness.  Musculoskeletal:        General: No tenderness. Normal range of motion.     Cervical back: Normal range of motion and neck supple.  Skin:    General: Skin is warm and dry.     Findings: No erythema or rash.  Neurological:     Mental Status: He is alert and oriented to person, place, and time.     Cranial Nerves: No cranial nerve deficit.     Deep Tendon Reflexes: Reflexes are normal and symmetric.  Psychiatric:        Behavior: Behavior normal.        Thought Content: Thought content normal.         Judgment: Judgment normal.       BP 135/81   Pulse 95   Temp (!) 97.2 F (36.2 C)   Ht '6\' 7"'  (2.007 m)   Wt (!) 323 lb 6.4 oz (146.7 kg)   SpO2 97%   BMI 36.43 kg/m      Assessment & Plan:  Derek Decker comes in today with chief complaint of Medical Management of Chronic Issues   Diagnosis and orders addressed:  1. Hypertension associated with type 2 diabetes mellitus ([place]) - CMP14+EGFR  2. Gastroesophageal reflux disease, unspecified whether esophagitis present - CMP14+EGFR  3. Hepatic steatosis - CMP14+EGFR  4. Morbid obesity (HCC) - CMP14+EGFR  5. Hyperlipidemia associated with type 2 diabetes mellitus (HCC) - CMP14+EGFR  6. Type 2 diabetes mellitus with other specified complication, without long-term current use of insulin (HCC) - Continuous Blood Gluc Sensor (FREESTYLE LIBRE 3 SENSOR) MISC; Use to test blood sugar continuously  Dispense: 2 each; Refill: 6 - Bayer DCA Hb A1c Waived - CMP14+EGFR   Labs pending Health Maintenance reviewed Diet and exercise encouraged  Follow up plan: 3 months    Evelina Dun, FNP

## 2022-05-03 NOTE — Patient Instructions (Signed)

## 2022-05-04 LAB — CMP14+EGFR
ALT: 32 IU/L (ref 0–44)
AST: 21 IU/L (ref 0–40)
Albumin/Globulin Ratio: 1.8 (ref 1.2–2.2)
Albumin: 4.3 g/dL (ref 4.1–5.1)
Alkaline Phosphatase: 66 IU/L (ref 44–121)
BUN/Creatinine Ratio: 6 — ABNORMAL LOW (ref 9–20)
BUN: 7 mg/dL (ref 6–20)
Bilirubin Total: 1.1 mg/dL (ref 0.0–1.2)
CO2: 25 mmol/L (ref 20–29)
Calcium: 9.5 mg/dL (ref 8.7–10.2)
Chloride: 104 mmol/L (ref 96–106)
Creatinine, Ser: 1.11 mg/dL (ref 0.76–1.27)
Globulin, Total: 2.4 g/dL (ref 1.5–4.5)
Glucose: 94 mg/dL (ref 70–99)
Potassium: 4.5 mmol/L (ref 3.5–5.2)
Sodium: 141 mmol/L (ref 134–144)
Total Protein: 6.7 g/dL (ref 6.0–8.5)
eGFR: 88 mL/min/{1.73_m2} (ref >59)

## 2022-05-31 ENCOUNTER — Telehealth: Payer: Self-pay | Admitting: *Deleted

## 2022-05-31 DIAGNOSIS — E1169 Type 2 diabetes mellitus with other specified complication: Secondary | ICD-10-CM

## 2022-05-31 NOTE — Telephone Encounter (Signed)
Mounjaro prior authorization status:  [employer] Prior Authorization Team is unable to review this request for prior authorization as there is a closed record on file with duplicate information, Case ID: [id number]. This case was closed on 02/06/2022 because the requested medication is not on the patient's formulary.

## 2022-06-01 MED ORDER — SEMAGLUTIDE (2 MG/DOSE) 8 MG/3ML ~~LOC~~ SOPN: 2.0000 mg | PEN_INJECTOR | SUBCUTANEOUS | 2 refills | Status: DC

## 2022-06-01 NOTE — Telephone Encounter (Signed)
lmtcb

## 2022-06-01 NOTE — Telephone Encounter (Signed)
Mounjaro switched to [employer] per insurance. This is still a weekly injection.

## 2022-06-05 NOTE — Telephone Encounter (Signed)
Patient informed that rx was sent to [facility]

## 2022-06-12 ENCOUNTER — Other Ambulatory Visit: Payer: Self-pay | Admitting: Family

## 2022-06-12 ENCOUNTER — Telehealth: Payer: 59 | Admitting: Nurse Practitioner

## 2022-06-12 DIAGNOSIS — I152 Hypertension secondary to endocrine disorders: Secondary | ICD-10-CM

## 2022-06-12 DIAGNOSIS — J40 Bronchitis, not specified as acute or chronic: Secondary | ICD-10-CM | POA: Diagnosis not present

## 2022-06-12 MED ORDER — AZITHROMYCIN 250 MG PO TABS: ORAL_TABLET | ORAL | 0 refills | Status: AC

## 2022-06-12 MED ORDER — ALBUTEROL SULFATE HFA 108 (90 BASE) MCG/ACT IN AERS: 2.0000 | INHALATION_SPRAY | Freq: Four times a day (QID) | RESPIRATORY_TRACT | 0 refills | Status: DC | PRN

## 2022-06-12 NOTE — Progress Notes (Signed)
Virtual Visit Consent   Derek Decker, you are scheduled for a virtual visit with a [facility] provider today. Just as with appointments in the office, your consent must be obtained to participate. Your consent will be active for this visit and any virtual visit you may have with one of our providers in the next 365 days. If you have a MyChart account, a copy of this consent can be sent to you electronically.  As this is a virtual visit, video technology does not allow for your provider to perform a traditional examination. This may limit your provider's ability to fully assess your condition. If your provider identifies any concerns that need to be evaluated in person or the need to arrange testing (such as labs, EKG, etc.), we will make arrangements to do so. Although advances in technology are sophisticated, we cannot ensure that it will always work on either your end or our end. If the connection with a video visit is poor, the visit may have to be switched to a telephone visit. With either a video or telephone visit, we are not always able to ensure that we have a secure connection.  By engaging in this virtual visit, you consent to the provision of healthcare and authorize for your insurance to be billed (if applicable) for the services provided during this visit. Depending on your insurance coverage, you may receive a charge related to this service.  I need to obtain your verbal consent now. Are you willing to proceed with your visit today? Derek Decker has provided verbal consent on 06/12/2022 for a virtual visit (video or telephone). [name], FNP  Date: 06/12/2022 12:07 PM  Virtual Visit via Video Note   I, [name], connected with  Derek Decker  ([id number], 12/10/1985) on 06/12/22 at 12:15 PM EDT by a video-enabled telemedicine application and verified that I am speaking with the correct person using two identifiers.  Location: Patient: Virtual Visit Location Patient:  Home Provider: Virtual Visit Location Provider: Home Office   I discussed the limitations of evaluation and management by telemedicine and the availability of in person appointments. The patient expressed understanding and agreed to proceed.    History of Present Illness: Derek Decker is a 36 y.o. who identifies as a male who was assigned male at birth, and is being seen today with complaints of nasal congestion, chest congestion and wheezing.   Denies a history of asthma or use of inhalers This has been happening for the past 5 days   He hd a low grade fever on day 2   Denies nausea or vomiting   He has been using Robitussin and Mucinex OTC   Problems:  Patient Active Problem List   Diagnosis Date Noted   Elevated LFTs 06/27/2020   Gastroesophageal reflux disease 06/27/2020   Hepatic steatosis 04/14/2020   Hyperlipidemia associated with type 2 diabetes mellitus (HCC) 04/13/2020   Hypertension associated with type 2 diabetes mellitus (HCC) 07/10/2019   Morbid obesity (HCC) 07/10/2019   Diabetes mellitus (HCC) 02/26/2019    Allergies: No Known Allergies Medications:  Current Outpatient Medications:    atorvastatin (LIPITOR) 20 MG tablet, TAKE 1 TABLET BY MOUTH EVERY DAY, Disp: 90 tablet, Rfl: 1   cetirizine (ZYRTEC) 10 MG tablet, Take 1 tablet (10 mg total) by mouth daily., Disp: 30 tablet, Rfl: 11   Continuous Blood Gluc Sensor (FREESTYLE LIBRE 3 SENSOR) MISC, Use to test blood sugar continuously, Disp: 2 each, Rfl: 6  diclofenac Sodium (VOLTAREN) 1 % GEL, Apply 4 g topically 4 (four) times daily., Disp: 350 g, Rfl: 2   fluticasone (FLONASE) 50 MCG/ACT nasal spray, Place 2 sprays into both nostrils daily., Disp: 16 g, Rfl: 6   glucose blood (ONETOUCH VERIO) test strip, USE 1 STRIP TO CHECK GLUCOSE four times a day, Disp: 100 each, Rfl: 2   lisinopril (ZESTRIL) 40 MG tablet, Take 1 tablet (40 mg total) by mouth daily., Disp: 90 tablet, Rfl: 3   metFORMIN (GLUCOPHAGE-XR) 500 MG  24 hr tablet, TAKE 1 TABLET BY MOUTH EVERY DAY WITH BREAKFAST, Disp: 90 tablet, Rfl: 1   naproxen sodium (ANAPROX) 550 MG tablet, Take 1 tablet (550 mg total) by mouth 2 (two) times daily., Disp: 60 tablet, Rfl: 2   omeprazole (PRILOSEC) 20 MG capsule, TAKE 1 CAPSULE BY MOUTH EVERY DAY BEFORE BREAKFAST, Disp: 90 capsule, Rfl: 1   ondansetron (ZOFRAN-ODT) 4 MG disintegrating tablet, Take 1 tablet (4 mg total) by mouth every 8 (eight) hours as needed for nausea or vomiting., Disp: 20 tablet, Rfl: 0   Semaglutide, 2 MG/DOSE, 8 MG/3ML SOPN, Inject 2 mg as directed once a week., Disp: 3 mL, Rfl: 2  Observations/Objective: Patient is well-developed, well-nourished in no acute distress.  Resting comfortably  at home.  Head is normocephalic, atraumatic.  No labored breathing.  Speech is clear and coherent with logical content.  Patient is alert and oriented at baseline.    Assessment and Plan: 1. Bronchitis  - azithromycin (ZITHROMAX) 250 MG tablet; Take 2 tablets on day 1, then 1 tablet daily on days 2 through 5  Dispense: 6 tablet; Refill: 0 - albuterol (VENTOLIN HFA) 108 (90 Base) MCG/ACT inhaler; Inhale 2 puffs into the lungs every 6 (six) hours as needed for wheezing or shortness of breath.  Dispense: 8 g; Refill: 0    Continue to use mucinex as needed  Follow Up Instructions: I discussed the assessment and treatment plan with the patient. The patient was provided an opportunity to ask questions and all were answered. The patient agreed with the plan and demonstrated an understanding of the instructions.  A copy of instructions were sent to the patient via MyChart unless otherwise noted below.    The patient was advised to call back or seek an in-person evaluation if the symptoms worsen or if the condition fails to improve as anticipated.  Time:  I spent 15 minutes with the patient via telehealth technology discussing the above problems/concerns.    [name], FNP

## 2022-06-18 ENCOUNTER — Other Ambulatory Visit: Payer: Self-pay | Admitting: Family

## 2022-06-27 NOTE — Telephone Encounter (Signed)
Do you want PA or change to something else?

## 2022-06-28 NOTE — Telephone Encounter (Signed)
Can we please verify he is taking Ozempic 2 mg weekly as this is on his medication list not Trulicity.   Jannifer Rodney, FNP

## 2022-06-28 NOTE — Telephone Encounter (Signed)
Patient is taking Ozempic 2 mg and not Trulicity.

## 2022-07-01 ENCOUNTER — Other Ambulatory Visit: Payer: Self-pay | Admitting: Family

## 2022-07-01 DIAGNOSIS — K219 Gastro-esophageal reflux disease without esophagitis: Secondary | ICD-10-CM

## 2022-07-01 DIAGNOSIS — E1169 Type 2 diabetes mellitus with other specified complication: Secondary | ICD-10-CM

## 2022-07-01 DIAGNOSIS — E119 Type 2 diabetes mellitus without complications: Secondary | ICD-10-CM

## 2022-07-09 ENCOUNTER — Other Ambulatory Visit: Payer: Self-pay | Admitting: Nurse Practitioner

## 2022-07-09 DIAGNOSIS — J40 Bronchitis, not specified as acute or chronic: Secondary | ICD-10-CM

## 2022-07-17 ENCOUNTER — Encounter: Payer: Self-pay | Admitting: *Deleted

## 2022-08-03 ENCOUNTER — Ambulatory Visit: Payer: 59 | Admitting: Family

## 2022-08-06 ENCOUNTER — Ambulatory Visit: Payer: 59 | Admitting: Family

## 2022-08-06 ENCOUNTER — Encounter: Payer: Self-pay | Admitting: Family

## 2022-08-06 VITALS — BP 139/81 | HR 114 | Temp 97.2°F | Ht 79.0 in | Wt 332.0 lb

## 2022-08-06 DIAGNOSIS — J029 Acute pharyngitis, unspecified: Secondary | ICD-10-CM

## 2022-08-06 DIAGNOSIS — K219 Gastro-esophageal reflux disease without esophagitis: Secondary | ICD-10-CM | POA: Diagnosis not present

## 2022-08-06 DIAGNOSIS — E785 Hyperlipidemia, unspecified: Secondary | ICD-10-CM

## 2022-08-06 DIAGNOSIS — Z23 Encounter for immunization: Secondary | ICD-10-CM | POA: Diagnosis not present

## 2022-08-06 DIAGNOSIS — E1169 Type 2 diabetes mellitus with other specified complication: Secondary | ICD-10-CM | POA: Diagnosis not present

## 2022-08-06 DIAGNOSIS — I152 Hypertension secondary to endocrine disorders: Secondary | ICD-10-CM | POA: Diagnosis not present

## 2022-08-06 DIAGNOSIS — E1159 Type 2 diabetes mellitus with other circulatory complications: Secondary | ICD-10-CM

## 2022-08-06 LAB — BAYER DCA HB A1C WAIVED: HB A1C (BAYER DCA - WAIVED): 5.8 % — ABNORMAL HIGH (ref 4.8–5.6)

## 2022-08-06 MED ORDER — CETIRIZINE HCL 10 MG PO TABS: 10.0000 mg | ORAL_TABLET | Freq: Every day | ORAL | 11 refills | Status: DC

## 2022-08-06 MED ORDER — AMLODIPINE BESYLATE 5 MG PO TABS: 5.0000 mg | ORAL_TABLET | Freq: Every day | ORAL | 1 refills | Status: DC

## 2022-08-06 NOTE — Patient Instructions (Signed)

## 2022-08-06 NOTE — Progress Notes (Signed)
Subjective:    Patient ID: Derek Decker, male    DOB: 09-30-1986, 36 y.o.   MRN: [id number]  Chief Complaint  Patient presents with   Medical Management of Chronic Issues   Pt presents to the office today for chronic follow up.    He is followed by GI for fatty liver and followed by dietitian as needed.  He is morbid obese with a BMI of 36 and DM and HTN.    He is  on Ozempic 2 mg, but has not been able to get prescription for the last 2 weeks because of national backorder. He has gained 9 lbs.      08/06/2022    1:56 PM 05/03/2022    3:13 PM 02/01/2022   10:54 AM  Last 3 Weights  Weight (lbs) 332 lb 323 lb 6.4 oz 338 lb  Weight (kg) 150.594 kg 146.693 kg 153.316 kg     Hypertension This is a chronic problem. The problem has been waxing and waning since onset. The problem is uncontrolled. Pertinent negatives include no blurred vision, malaise/fatigue, peripheral edema or shortness of breath. Risk factors for coronary artery disease include dyslipidemia and obesity. The current treatment provides moderate improvement.  Diabetes He presents for his follow-up diabetic visit. He has type 2 diabetes mellitus. Pertinent negatives for diabetes include no blurred vision and no foot paresthesias. Symptoms are stable. Risk factors for coronary artery disease include dyslipidemia, diabetes mellitus, hypertension and sedentary lifestyle. He is following a generally healthy diet. His overall blood glucose range is 140-180 mg/dl. Eye exam is not current.  Hyperlipidemia This is a chronic problem. The current episode started more than 1 year ago. Exacerbating diseases include obesity. Pertinent negatives include no shortness of breath. Current antihyperlipidemic treatment includes statins. The current treatment provides moderate improvement of lipids. Risk factors for coronary artery disease include dyslipidemia, diabetes mellitus, male sex, hypertension and a sedentary lifestyle.  Gastroesophageal  Reflux He complains of belching and heartburn. This is a chronic problem. The current episode started more than 1 year ago. The problem occurs occasionally. Risk factors include obesity. He has tried a PPI for the symptoms. The treatment provided moderate relief.      Review of Systems  Constitutional:  Negative for malaise/fatigue.  Eyes:  Negative for blurred vision.  Respiratory:  Negative for shortness of breath.   Gastrointestinal:  Positive for heartburn.  All other systems reviewed and are negative.      Objective:   Physical Exam Vitals reviewed.  Constitutional:      General: He is not in acute distress.    Appearance: He is well-developed. He is obese.  HENT:     Head: Normocephalic.     Right Ear: Tympanic membrane normal.     Left Ear: Tympanic membrane normal.  Eyes:     General:        Right eye: No discharge.        Left eye: No discharge.     Pupils: Pupils are equal, round, and reactive to light.  Neck:     Thyroid: No thyromegaly.  Cardiovascular:     Rate and Rhythm: Normal rate and regular rhythm.     Heart sounds: Normal heart sounds. No murmur heard. Pulmonary:     Effort: Pulmonary effort is normal. No respiratory distress.     Breath sounds: Normal breath sounds. No wheezing.  Abdominal:     General: Bowel sounds are normal. There is no distension.  Palpations: Abdomen is soft.     Tenderness: There is no abdominal tenderness.  Musculoskeletal:        General: No tenderness. Normal range of motion.     Cervical back: Normal range of motion and neck supple.  Skin:    General: Skin is warm and dry.     Findings: No erythema or rash.  Neurological:     Mental Status: He is alert and oriented to person, place, and time.     Cranial Nerves: No cranial nerve deficit.     Deep Tendon Reflexes: Reflexes are normal and symmetric.  Psychiatric:        Behavior: Behavior normal.        Thought Content: Thought content normal.        Judgment:  Judgment normal.       BP 139/81   Pulse (!) 114   Temp (!) 97.2 F (36.2 C) (Temporal)   Ht _0  (2.007 m)   Wt (!) 332 lb (150.6 kg)   SpO2 96%   BMI 37.40 kg/m      Assessment & Plan:  Derek Decker comes in today with chief complaint of Medical Management of Chronic Issues   Diagnosis and orders addressed:   2. Need for immunization against influenza - Flu Vaccine QUAD 3moIM (Fluarix, Fluzone & Alfiuria Quad PF) - CMP14+EGFR  3. Hypertension associated with type 2 diabetes mellitus (HCC) -Will add Norvasc 5 mg -Daily blood pressure log given with instructions on how to fill out and told to bring to next visit -Dash diet information given -Exercise encouraged - Stress Management  -Continue current meds -RTO in 1 month  - amLODipine (NORVASC) 5 MG tablet; Take 1 tablet (5 mg total) by mouth daily.  Dispense: 90 tablet; Refill: 1 - CMP14+EGFR  4. Gastroesophageal reflux disease, unspecified whether esophagitis present - CMP14+EGFR  5. Hyperlipidemia associated with type 2 diabetes mellitus ([place]  - CMP14+EGFR  6. Morbid obesity ([place] - CMP14+EGFR  7. Type 2 diabetes mellitus with other specified complication, without long-term current use of insulin (HCC) - CMP14+EGFR - Bayer DCA Hb A1c Waived - Microalbumin / creatinine urine ratio   Labs pending Health Maintenance reviewed Diet and exercise encouraged  Follow up plan: 1 month   CEvelina Dun FNP

## 2022-08-07 LAB — CMP14+EGFR
ALT: 32 IU/L (ref 0–44)
AST: 21 IU/L (ref 0–40)
Albumin/Globulin Ratio: 1.8 (ref 1.2–2.2)
Albumin: 4.4 g/dL (ref 4.1–5.1)
Alkaline Phosphatase: 67 IU/L (ref 44–121)
BUN/Creatinine Ratio: 9 (ref 9–20)
BUN: 9 mg/dL (ref 6–20)
Bilirubin Total: 1.2 mg/dL (ref 0.0–1.2)
CO2: 24 mmol/L (ref 20–29)
Calcium: 9.7 mg/dL (ref 8.7–10.2)
Chloride: 104 mmol/L (ref 96–106)
Creatinine, Ser: 1.03 mg/dL (ref 0.76–1.27)
Globulin, Total: 2.5 g/dL (ref 1.5–4.5)
Glucose: 159 mg/dL — ABNORMAL HIGH (ref 70–99)
Potassium: 4.4 mmol/L (ref 3.5–5.2)
Sodium: 141 mmol/L (ref 134–144)
Total Protein: 6.9 g/dL (ref 6.0–8.5)
eGFR: 96 mL/min/{1.73_m2} (ref >59)

## 2022-08-08 LAB — MICROALBUMIN / CREATININE URINE RATIO
Creatinine, Urine: 199.5 mg/dL
Microalb/Creat Ratio: 107 mg/g creat — ABNORMAL HIGH (ref 0–29)
Microalbumin, Urine: 212.8 ug/mL

## 2022-08-23 ENCOUNTER — Telehealth: Payer: Self-pay | Admitting: Family Medicine

## 2022-08-23 DIAGNOSIS — L02415 Cutaneous abscess of right lower limb: Secondary | ICD-10-CM

## 2022-08-23 MED ORDER — SULFAMETHOXAZOLE-TRIMETHOPRIM 800-160 MG PO TABS: 1.0000 | ORAL_TABLET | Freq: Two times a day (BID) | ORAL | 0 refills | Status: AC

## 2022-08-23 NOTE — Progress Notes (Signed)
Virtual Visit Consent   DIOR STEPTER, you are scheduled for a virtual visit with a [facility] provider today. Just as with appointments in the office, your consent must be obtained to participate. Your consent will be active for this visit and any virtual visit you may have with one of our providers in the next 365 days. If you have a MyChart account, a copy of this consent can be sent to you electronically.  As this is a virtual visit, video technology does not allow for your provider to perform a traditional examination. This may limit your provider's ability to fully assess your condition. If your provider identifies any concerns that need to be evaluated in person or the need to arrange testing (such as labs, EKG, etc.), we will make arrangements to do so. Although advances in technology are sophisticated, we cannot ensure that it will always work on either your end or our end. If the connection with a video visit is poor, the visit may have to be switched to a telephone visit. With either a video or telephone visit, we are not always able to ensure that we have a secure connection.  By engaging in this virtual visit, you consent to the provision of healthcare and authorize for your insurance to be billed (if applicable) for the services provided during this visit. Depending on your insurance coverage, you may receive a charge related to this service.  I need to obtain your verbal consent now. Are you willing to proceed with your visit today? Derek Decker has provided verbal consent on 08/23/2022 for a virtual visit (video or telephone). [name], NP  Date: 08/23/2022 11:15 AM  Virtual Visit via Video Note   I, [name], connected with  Derek Decker  ([id number], May 20, 1986) on 08/23/22 at 11:15 AM EST by a video-enabled telemedicine application and verified that I am speaking with the correct person using two identifiers.  Location: Patient: Virtual Visit Location Patient:  Home Provider: Virtual Visit Location Provider: Home Office   I discussed the limitations of evaluation and management by telemedicine and the availability of in person appointments. The patient expressed understanding and agreed to proceed.    History of Present Illness: Derek Decker is a 36 y.o. who identifies as a male who was assigned male at birth, and is being seen today for abscess/cyst. Upper thigh under buttock- right side. Does not have drainage from area at this time. Feels it, can not see it well. Has history of these drained in past- 2021. Denies fevers, chills. Pain level is 5/10. This is the only complaint today.    Problems:  Patient Active Problem List   Diagnosis Date Noted   Elevated LFTs 06/27/2020   Gastroesophageal reflux disease 06/27/2020   Hepatic steatosis 04/14/2020   Hyperlipidemia associated with type 2 diabetes mellitus (HCC) 04/13/2020   Hypertension associated with type 2 diabetes mellitus (HCC) 07/10/2019   Morbid obesity (HCC) 07/10/2019   Diabetes mellitus (HCC) 02/26/2019    Allergies: No Known Allergies Medications:  Current Outpatient Medications:    albuterol (VENTOLIN HFA) 108 (90 Base) MCG/ACT inhaler, TAKE 2 PUFFS BY MOUTH EVERY 6 HOURS AS NEEDED FOR WHEEZE OR SHORTNESS OF BREATH (Patient not taking: Reported on 08/06/2022), Disp: 8.5 each, Rfl: 2   amLODipine (NORVASC) 5 MG tablet, Take 1 tablet (5 mg total) by mouth daily., Disp: 90 tablet, Rfl: 1   atorvastatin (LIPITOR) 20 MG tablet, TAKE 1 TABLET BY MOUTH EVERY DAY,  Disp: 90 tablet, Rfl: 0   cetirizine (ZYRTEC) 10 MG tablet, Take 1 tablet (10 mg total) by mouth daily., Disp: 30 tablet, Rfl: 11   Continuous Blood Gluc Sensor (FREESTYLE LIBRE 3 SENSOR) MISC, Use to test blood sugar continuously, Disp: 2 each, Rfl: 6   glucose blood (ONETOUCH VERIO) test strip, USE 1 STRIP TO CHECK GLUCOSE four times a day, Disp: 100 each, Rfl: 2   lisinopril (ZESTRIL) 40 MG tablet, TAKE 1 TABLET BY MOUTH  EVERY DAY, Disp: 90 tablet, Rfl: 0   metFORMIN (GLUCOPHAGE-XR) 500 MG 24 hr tablet, TAKE 1 TABLET BY MOUTH EVERY DAY WITH BREAKFAST, Disp: 90 tablet, Rfl: 0   naproxen sodium (ANAPROX) 550 MG tablet, Take 1 tablet (550 mg total) by mouth 2 (two) times daily., Disp: 60 tablet, Rfl: 2   omeprazole (PRILOSEC) 20 MG capsule, TAKE 1 CAPSULE BY MOUTH EVERY DAY BEFORE BREAKFAST, Disp: 90 capsule, Rfl: 0   Semaglutide, 2 MG/DOSE, 8 MG/3ML SOPN, Inject 2 mg as directed once a week., Disp: 3 mL, Rfl: 2  Observations/Objective: Patient is well-developed, well-nourished in no acute distress.  Resting comfortably  at home.  Head is normocephalic, atraumatic.  No labored breathing.  Speech is clear and coherent with logical content.  Patient is alert and oriented at baseline.  Due to nature of visit unable to fully assess site  Assessment and Plan: 1. Cutaneous abscess of right lower extremity  - sulfamethoxazole-trimethoprim (BACTRIM DS) 800-160 MG tablet; Take 1 tablet by mouth 2 (two) times daily for 7 days.  Dispense: 14 tablet; Refill: 0  -known history of skin abscesses that have required Abx and at times I&D -this one just started up- and would like to try Abx before referring for I&D -advised if not improved will need in person eval and assessment -take medication as directed -keep area clean and dry -avoid pressure as much as possible   Reviewed side effects, risks and benefits of medication.    Patient acknowledged agreement and understanding of the plan.   Past Medical, Surgical, Social History, Allergies, and Medications have been Reviewed.   Follow Up Instructions: I discussed the assessment and treatment plan with the patient. The patient was provided an opportunity to ask questions and all were answered. The patient agreed with the plan and demonstrated an understanding of the instructions.  A copy of instructions were sent to the patient via MyChart unless otherwise noted below.      The patient was advised to call back or seek an in-person evaluation if the symptoms worsen or if the condition fails to improve as anticipated.  Time:  I spent 12 minutes with the patient via telehealth technology discussing the above problems/concerns.    [name], NP

## 2022-08-23 NOTE — Patient Instructions (Addendum)
Derek Decker, thank you for joining [name], NP for today's virtual visit.  While this provider is not your primary care provider (PCP), if your PCP is located in our provider database this encounter information will be shared with them immediately following your visit.   A [facility] MyChart account gives you access to today's visit and all your visits, tests, and labs performed at [facility] " click here if you don't have a [facility] MyChart account or go to [email or web address]  Consent: (Patient) Derek Decker provided verbal consent for this virtual visit at the beginning of the encounter.  Current Medications:  Current Outpatient Medications:    sulfamethoxazole-trimethoprim (BACTRIM DS) 800-160 MG tablet, Take 1 tablet by mouth 2 (two) times daily for 7 days., Disp: 14 tablet, Rfl: 0   albuterol (VENTOLIN HFA) 108 (90 Base) MCG/ACT inhaler, TAKE 2 PUFFS BY MOUTH EVERY 6 HOURS AS NEEDED FOR WHEEZE OR SHORTNESS OF BREATH (Patient not taking: Reported on 08/06/2022), Disp: 8.5 each, Rfl: 2   amLODipine (NORVASC) 5 MG tablet, Take 1 tablet (5 mg total) by mouth daily., Disp: 90 tablet, Rfl: 1   atorvastatin (LIPITOR) 20 MG tablet, TAKE 1 TABLET BY MOUTH EVERY DAY, Disp: 90 tablet, Rfl: 0   cetirizine (ZYRTEC) 10 MG tablet, Take 1 tablet (10 mg total) by mouth daily., Disp: 30 tablet, Rfl: 11   Continuous Blood Gluc Sensor (FREESTYLE LIBRE 3 SENSOR) MISC, Use to test blood sugar continuously, Disp: 2 each, Rfl: 6   glucose blood (ONETOUCH VERIO) test strip, USE 1 STRIP TO CHECK GLUCOSE four times a day, Disp: 100 each, Rfl: 2   lisinopril (ZESTRIL) 40 MG tablet, TAKE 1 TABLET BY MOUTH EVERY DAY, Disp: 90 tablet, Rfl: 0   metFORMIN (GLUCOPHAGE-XR) 500 MG 24 hr tablet, TAKE 1 TABLET BY MOUTH EVERY DAY WITH BREAKFAST, Disp: 90 tablet, Rfl: 0   naproxen sodium (ANAPROX) 550 MG tablet, Take 1 tablet (550 mg total) by mouth 2 (two) times daily., Disp: 60 tablet, Rfl: 2    omeprazole (PRILOSEC) 20 MG capsule, TAKE 1 CAPSULE BY MOUTH EVERY DAY BEFORE BREAKFAST, Disp: 90 capsule, Rfl: 0   Semaglutide, 2 MG/DOSE, 8 MG/3ML SOPN, Inject 2 mg as directed once a week., Disp: 3 mL, Rfl: 2   Medications ordered in this encounter:  Meds ordered this encounter  Medications   sulfamethoxazole-trimethoprim (BACTRIM DS) 800-160 MG tablet    Sig: Take 1 tablet by mouth 2 (two) times daily for 7 days.    Dispense:  14 tablet    Refill:  0    Order Specific Question:   Supervising Provider    Answer:   Merrilee Jansky [id number]     *If you need refills on other medications prior to your next appointment, please contact your pharmacy*  Follow-Up: Call back or seek an in-person evaluation if the symptoms worsen or if the condition fails to improve as anticipated.  [facility] [phone]  Other Instructions Happy Holidays  -take medication as directed -keep area clean and dry -avoid pressure as much as possible -follow up in person if worsening and or not improving   Skin Abscess  A skin abscess is an infected area of your skin that contains pus and other material. An abscess can happen in any part of your body. Some abscesses break open (rupture) on their own. Most continue to get worse unless they are treated. The infection can spread deeper into the body and  into your blood, which can make you feel sick. A skin abscess is caused by germs that enter the skin through a cut or scrape. It can also be caused by blocked oil and sweat glands or infected hair follicles. This condition is usually treated by: Draining the pus. Taking antibiotic medicines. Placing a warm, wet washcloth over the abscess. Follow these instructions at home: Medicines  Take over-the-counter and prescription medicines only as told by your doctor. If you were prescribed an antibiotic medicine, take it as told by your doctor. Do not stop taking the antibiotic even if you  start to feel better. Abscess care  If you have an abscess that has not drained, place a warm, clean, wet washcloth over the abscess several times a day. Do this as told by your doctor. Follow instructions from your doctor about how to take care of your abscess. Make sure you: Cover the abscess with a bandage (dressing). Change your bandage or gauze as told by your doctor. Wash your hands with soap and water before you change the bandage or gauze. If you cannot use soap and water, use hand sanitizer. Check your abscess every day for signs that the infection is getting worse. Check for: More redness, swelling, or pain. More fluid or blood. Warmth. More pus or a bad smell. General instructions To avoid spreading the infection: Do not share personal care items, towels, or hot tubs with others. Avoid making skin-to-skin contact with other people. Keep all follow-up visits as told by your doctor. This is important. Contact a doctor if: You have more redness, swelling, or pain around your abscess. You have more fluid or blood coming from your abscess. Your abscess feels warm when you touch it. You have more pus or a bad smell coming from your abscess. Your muscles ache. You feel sick. Get help right away if: You have very bad (severe) pain. You see red streaks on your skin spreading away from the abscess. You see redness that spreads quickly. You have a fever or chills. Summary A skin abscess is an infected area of your skin that contains pus and other material. The abscess is caused by germs that enter the skin through a cut or scrape. It can also be caused by blocked oil and sweat glands or infected hair follicles. Follow your doctor's instructions on caring for your abscess, taking medicines, preventing infections, and keeping follow-up visits. This information is not intended to replace advice given to you by your health care provider. Make sure you discuss any questions you have with  your health care provider. Document Revised: 01/04/2022 Document Reviewed: 07/10/2021 Elsevier Patient Education  2023 Elsevier Inc.    If you have been instructed to have an in-person evaluation today at a local Urgent Care facility, please use the link below. It will take you to a list of all of our available [facility], including address, phone number and hours of operation. Please do not delay care.  [facility]  If you or a family member do not have a primary care provider, use the link below to schedule a visit and establish care. When you choose a [facility] primary care physician or advanced practice provider, you gain a long-term partner in health. Find a Primary Care Provider  Learn more about [facility]'s in-office and virtual care options:  - Get Care Now

## 2022-09-05 ENCOUNTER — Other Ambulatory Visit: Payer: Self-pay | Admitting: Family

## 2022-09-05 DIAGNOSIS — E1159 Type 2 diabetes mellitus with other circulatory complications: Secondary | ICD-10-CM

## 2022-09-11 ENCOUNTER — Other Ambulatory Visit: Payer: Self-pay | Admitting: Family

## 2022-09-11 ENCOUNTER — Ambulatory Visit: Payer: 59 | Admitting: Family

## 2022-09-11 ENCOUNTER — Encounter: Payer: Self-pay | Admitting: Family

## 2022-09-11 VITALS — BP 118/73 | HR 110 | Temp 97.5°F | Ht 79.0 in | Wt 333.0 lb

## 2022-09-11 DIAGNOSIS — K760 Fatty (change of) liver, not elsewhere classified: Secondary | ICD-10-CM

## 2022-09-11 DIAGNOSIS — E1169 Type 2 diabetes mellitus with other specified complication: Secondary | ICD-10-CM

## 2022-09-11 DIAGNOSIS — K219 Gastro-esophageal reflux disease without esophagitis: Secondary | ICD-10-CM | POA: Diagnosis not present

## 2022-09-11 DIAGNOSIS — E1159 Type 2 diabetes mellitus with other circulatory complications: Secondary | ICD-10-CM

## 2022-09-11 DIAGNOSIS — I152 Hypertension secondary to endocrine disorders: Secondary | ICD-10-CM

## 2022-09-11 DIAGNOSIS — E785 Hyperlipidemia, unspecified: Secondary | ICD-10-CM | POA: Diagnosis not present

## 2022-09-11 MED ORDER — TIRZEPATIDE 7.5 MG/0.5ML ~~LOC~~ SOAJ: 7.5000 mg | SUBCUTANEOUS | 2 refills | Status: DC

## 2022-09-11 NOTE — Patient Instructions (Signed)
Hypertension, Adult High blood pressure (hypertension) is when the force of blood pumping through the arteries is too strong. The arteries are the blood vessels that carry blood from the heart throughout the body. Hypertension forces the heart to work harder to pump blood and may cause arteries to become narrow or stiff. Untreated or uncontrolled hypertension can lead to a heart attack, heart failure, a stroke, kidney disease, and other problems. A blood pressure reading consists of a higher number over a lower number. Ideally, your blood pressure should be below 120/80. The first ("top") number is called the systolic pressure. It is a measure of the pressure in your arteries as your heart beats. The second ("bottom") number is called the diastolic pressure. It is a measure of the pressure in your arteries as the heart relaxes. What are the causes? The exact cause of this condition is not known. There are some conditions that result in high blood pressure. What increases the risk? Certain factors may make you more likely to develop high blood pressure. Some of these risk factors are under your control, including: Smoking. Not getting enough exercise or physical activity. Being overweight. Having too much fat, sugar, calories, or salt (sodium) in your diet. Drinking too much alcohol. Other risk factors include: Having a personal history of heart disease, diabetes, high cholesterol, or kidney disease. Stress. Having a family history of high blood pressure and high cholesterol. Having obstructive sleep apnea. Age. The risk increases with age. What are the signs or symptoms? High blood pressure may not cause symptoms. Very high blood pressure (hypertensive crisis) may cause: Headache. Fast or irregular heartbeats (palpitations). Shortness of breath. Nosebleed. Nausea and vomiting. Vision changes. Severe chest pain, dizziness, and seizures. How is this diagnosed? This condition is diagnosed by  measuring your blood pressure while you are seated, with your arm resting on a flat surface, your legs uncrossed, and your feet flat on the floor. The cuff of the blood pressure monitor will be placed directly against the skin of your upper arm at the level of your heart. Blood pressure should be measured at least twice using the same arm. Certain conditions can cause a difference in blood pressure between your right and left arms. If you have a high blood pressure reading during one visit or you have normal blood pressure with other risk factors, you may be asked to: Return on a different day to have your blood pressure checked again. Monitor your blood pressure at home for 1 week or longer. If you are diagnosed with hypertension, you may have other blood or imaging tests to help your health care provider understand your overall risk for other conditions. How is this treated? This condition is treated by making healthy lifestyle changes, such as eating healthy foods, exercising more, and reducing your alcohol intake. You may be referred for counseling on a healthy diet and physical activity. Your health care provider may prescribe medicine if lifestyle changes are not enough to get your blood pressure under control and if: Your systolic blood pressure is above 130. Your diastolic blood pressure is above 80. Your personal target blood pressure may vary depending on your medical conditions, your age, and other factors. Follow these instructions at home: Eating and drinking  Eat a diet that is high in fiber and potassium, and low in sodium, added sugar, and fat. An example of this eating plan is called the DASH diet. DASH stands for Dietary Approaches to Stop Hypertension. To eat this way: Eat   plenty of fresh fruits and vegetables. Try to fill one half of your plate at each meal with fruits and vegetables. Eat whole grains, such as whole-wheat pasta, brown rice, or whole-grain bread. Fill about one  fourth of your plate with whole grains. Eat or drink low-fat dairy products, such as skim milk or low-fat yogurt. Avoid fatty cuts of meat, processed or cured meats, and poultry with skin. Fill about one fourth of your plate with lean proteins, such as fish, chicken without skin, beans, eggs, or tofu. Avoid pre-made and processed foods. These tend to be higher in sodium, added sugar, and fat. Reduce your daily sodium intake. Many people with hypertension should eat less than 1,500 mg of sodium a day. Do not drink alcohol if: Your health care provider tells you not to drink. You are pregnant, may be pregnant, or are planning to become pregnant. If you drink alcohol: Limit how much you have to: 0-1 drink a day for women. 0-2 drinks a day for men. Know how much alcohol is in your drink. In the [place] one drink equals one 12 oz bottle of beer (355 mL), one 5 oz glass of wine (148 mL), or one 1 oz glass of hard liquor (44 mL). Lifestyle  Work with your health care provider to maintain a healthy body weight or to lose weight. Ask what an ideal weight is for you. Get at least 30 minutes of exercise that causes your heart to beat faster (aerobic exercise) most days of the week. Activities may include walking, swimming, or biking. Include exercise to strengthen your muscles (resistance exercise), such as Pilates or lifting weights, as part of your weekly exercise routine. Try to do these types of exercises for 30 minutes at least 3 days a week. Do not use any products that contain nicotine or tobacco. These products include cigarettes, chewing tobacco, and vaping devices, such as e-cigarettes. If you need help quitting, ask your health care provider. Monitor your blood pressure at home as told by your health care provider. Keep all follow-up visits. This is important. Medicines Take over-the-counter and prescription medicines only as told by your health care provider. Follow directions carefully. Blood  pressure medicines must be taken as prescribed. Do not skip doses of blood pressure medicine. Doing this puts you at risk for problems and can make the medicine less effective. Ask your health care provider about side effects or reactions to medicines that you should watch for. Contact a health care provider if you: Think you are having a reaction to a medicine you are taking. Have headaches that keep coming back (recurring). Feel dizzy. Have swelling in your ankles. Have trouble with your vision. Get help right away if you: Develop a severe headache or confusion. Have unusual weakness or numbness. Feel faint. Have severe pain in your chest or abdomen. Vomit repeatedly. Have trouble breathing. These symptoms may be an emergency. Get help right away. Call 911. Do not wait to see if the symptoms will go away. Do not drive yourself to the hospital. Summary Hypertension is when the force of blood pumping through your arteries is too strong. If this condition is not controlled, it may put you at risk for serious complications. Your personal target blood pressure may vary depending on your medical conditions, your age, and other factors. For most people, a normal blood pressure is less than 120/80. Hypertension is treated with lifestyle changes, medicines, or a combination of both. Lifestyle changes include losing weight, eating a healthy,   low-sodium diet, exercising more, and limiting alcohol. This information is not intended to replace advice given to you by your health care provider. Make sure you discuss any questions you have with your health care provider. Document Revised: 08/08/2021 Document Reviewed: 08/08/2021 Elsevier Patient Education  2023 Elsevier Inc.  

## 2022-09-11 NOTE — Progress Notes (Signed)
Subjective:    Patient ID: Derek Decker, male    DOB: [birth year], 36 y.o.   MRN: [id number]  Chief Complaint  Patient presents with   Follow-up   Pt presents to the office today for chronic follow up.    He is followed by GI for fatty liver and followed by dietitian as needed.  He is morbid obese with a BMI of 37 and DM and HTN.    He is  on Ozempic 2 mg, but has not been able to get prescription for the last several weeks because of national backorder.     09/11/2022    2:11 PM 08/06/2022    1:56 PM 05/03/2022    3:13 PM  Last 3 Weights  Weight (lbs) 333 lb 332 lb 323 lb 6.4 oz  Weight (kg) 151.048 kg 150.594 kg 146.693 kg     Hypertension This is a chronic problem. The current episode started more than 1 year ago. The problem has been waxing and waning since onset. The problem is uncontrolled. Pertinent negatives include no blurred vision, malaise/fatigue, peripheral edema or shortness of breath. Risk factors for coronary artery disease include dyslipidemia, obesity and male gender. The current treatment provides moderate improvement. There is no history of heart failure.  Gastroesophageal Reflux He complains of belching and heartburn. This is a chronic problem. The current episode started more than 1 year ago. The problem occurs occasionally. Risk factors include obesity. He has tried a PPI for the symptoms. The treatment provided moderate relief.  Hyperlipidemia This is a chronic problem. The current episode started more than 1 year ago. The problem is controlled. Recent lipid tests were reviewed and are normal. Exacerbating diseases include obesity. Pertinent negatives include no shortness of breath. Current antihyperlipidemic treatment includes statins. The current treatment provides moderate improvement of lipids. Risk factors for coronary artery disease include dyslipidemia, diabetes mellitus, hypertension and a sedentary lifestyle.  Diabetes He presents for his follow-up  diabetic visit. He has type 2 diabetes mellitus. There are no hypoglycemic associated symptoms. Pertinent negatives for diabetes include no blurred vision and no foot paresthesias. Symptoms are stable. Pertinent negatives for diabetic complications include no peripheral neuropathy. Risk factors for coronary artery disease include dyslipidemia, diabetes mellitus, male sex, hypertension and sedentary lifestyle. He is following a generally healthy diet. His overall blood glucose range is 110-130 mg/dl. Eye exam is not current.      Review of Systems  Constitutional:  Negative for malaise/fatigue.  Eyes:  Negative for blurred vision.  Respiratory:  Negative for shortness of breath.   Gastrointestinal:  Positive for heartburn.  All other systems reviewed and are negative.      Objective:   Physical Exam Vitals reviewed.  Constitutional:      General: He is not in acute distress.    Appearance: He is well-developed. He is obese.  HENT:     Head: Normocephalic.     Right Ear: Tympanic membrane normal.     Left Ear: Tympanic membrane normal.  Eyes:     General:        Right eye: No discharge.        Left eye: No discharge.     Pupils: Pupils are equal, round, and reactive to light.  Neck:     Thyroid: No thyromegaly.  Cardiovascular:     Rate and Rhythm: Normal rate and regular rhythm.     Heart sounds: Normal heart sounds. No murmur heard. Pulmonary:     Effort:  Pulmonary effort is normal. No respiratory distress.     Breath sounds: Normal breath sounds. No wheezing.  Abdominal:     General: Bowel sounds are normal. There is no distension.     Palpations: Abdomen is soft.     Tenderness: There is no abdominal tenderness.  Musculoskeletal:        General: No tenderness. Normal range of motion.     Cervical back: Normal range of motion and neck supple.  Skin:    General: Skin is warm and dry.     Findings: No erythema or rash.  Neurological:     Mental Status: He is alert and  oriented to person, place, and time.     Cranial Nerves: No cranial nerve deficit.     Deep Tendon Reflexes: Reflexes are normal and symmetric.  Psychiatric:        Behavior: Behavior normal.        Thought Content: Thought content normal.        Judgment: Judgment normal.       BP 118/73   Pulse (!) 110   Temp (!) 97.5 F (36.4 C) (Temporal)   Ht 6\' 7"  (2.007 m)   Wt (!) 333 lb (151 kg)   BMI 37.51 kg/m      Assessment & Plan:  Derek Decker comes in today with chief complaint of Follow-up   Diagnosis and orders addressed:  1. Type 2 diabetes mellitus with other specified complication, without long-term current use of insulin (HCC) - tirzepatide (MOUNJARO) 7.5 MG/0.5ML Pen; Inject 7.5 mg into the skin once a week.  Dispense: 6 mL; Refill: 2  2. Hyperlipidemia associated with type 2 diabetes mellitus (HCC) - tirzepatide (MOUNJARO) 7.5 MG/0.5ML Pen; Inject 7.5 mg into the skin once a week.  Dispense: 6 mL; Refill: 2  3. Hypertension associated with type 2 diabetes mellitus (HCC) - tirzepatide (MOUNJARO) 7.5 MG/0.5ML Pen; Inject 7.5 mg into the skin once a week.  Dispense: 6 mL; Refill: 2  4. Morbid obesity (HCC) - tirzepatide (MOUNJARO) 7.5 MG/0.5ML Pen; Inject 7.5 mg into the skin once a week.  Dispense: 6 mL; Refill: 2  5. Gastroesophageal reflux disease, unspecified whether esophagitis present  6. Hepatic steatosis    Labs pending Will change Ozempic to Mounjaro 7.5 mg  Health Maintenance reviewed Diet and exercise encouraged  Follow up plan: 3 months    Earma Reading, FNP

## 2022-09-12 NOTE — Telephone Encounter (Signed)
TRULICITY 0.75 MG/0.5ML SOPN        Changed from: tirzepatide (MOUNJARO) 7.5 MG/0.5ML Pen   Pharmacy comment: Alternative Requested:NOT COVERED BY INSURANCE.

## 2022-09-14 ENCOUNTER — Other Ambulatory Visit: Payer: Self-pay | Admitting: Family

## 2022-09-14 DIAGNOSIS — I152 Hypertension secondary to endocrine disorders: Secondary | ICD-10-CM

## 2022-09-14 DIAGNOSIS — E1169 Type 2 diabetes mellitus with other specified complication: Secondary | ICD-10-CM

## 2022-09-14 NOTE — Telephone Encounter (Signed)
Name from pharmacy: TRULICITY 0.75 MG/0.5 ML PEN   Pharmacy comment: Script Clarification:PLEASE CLARIFY IF SIG CODE DIRECTIONS ARE CORRECT OR DRUG; IF 1.5MG  TO INJECT PLEASE CHANGE TO TRULICITY 1.5MG /0.5MG ; OR IS 0.75MG  CHANGE SIG CODE TO THAT.

## 2022-09-19 ENCOUNTER — Other Ambulatory Visit: Payer: Self-pay | Admitting: Nurse Practitioner

## 2022-09-19 DIAGNOSIS — I152 Hypertension secondary to endocrine disorders: Secondary | ICD-10-CM

## 2022-09-19 DIAGNOSIS — E1169 Type 2 diabetes mellitus with other specified complication: Secondary | ICD-10-CM

## 2022-09-19 MED ORDER — DULAGLUTIDE 3 MG/0.5ML ~~LOC~~ SOAJ: 3.0000 mg | SUBCUTANEOUS | 2 refills | Status: DC

## 2022-11-04 ENCOUNTER — Other Ambulatory Visit: Payer: Self-pay | Admitting: Family

## 2022-11-04 DIAGNOSIS — K219 Gastro-esophageal reflux disease without esophagitis: Secondary | ICD-10-CM

## 2022-11-04 DIAGNOSIS — E119 Type 2 diabetes mellitus without complications: Secondary | ICD-10-CM

## 2022-11-04 DIAGNOSIS — E1169 Type 2 diabetes mellitus with other specified complication: Secondary | ICD-10-CM

## 2022-11-05 ENCOUNTER — Telehealth: Payer: Self-pay | Admitting: Family

## 2022-11-05 DIAGNOSIS — K219 Gastro-esophageal reflux disease without esophagitis: Secondary | ICD-10-CM

## 2022-11-05 NOTE — Telephone Encounter (Signed)
Name from pharmacy: OMEPRAZOLE DR [address] [place] comment: Alternative Requested:PRIOR AUTHORIZATION NEEDED FOR MORE THAN 90 CAPSULES PER YEAR.

## 2022-11-06 ENCOUNTER — Telehealth: Payer: Self-pay

## 2022-11-06 ENCOUNTER — Other Ambulatory Visit (HOSPITAL_COMMUNITY): Payer: Self-pay

## 2022-11-06 NOTE — Telephone Encounter (Signed)
PA for Omeprazole approved.

## 2022-11-06 NOTE — Telephone Encounter (Signed)
Patient Advocate Encounter Prior authorization is required for Omeprazole 20MG  dr capsules. PA submitted and APPROVED on 11/06/22.  Key [id number]

## 2022-11-23 ENCOUNTER — Other Ambulatory Visit: Payer: Self-pay | Admitting: Family

## 2022-11-23 DIAGNOSIS — E1159 Type 2 diabetes mellitus with other circulatory complications: Secondary | ICD-10-CM

## 2022-11-23 DIAGNOSIS — E1169 Type 2 diabetes mellitus with other specified complication: Secondary | ICD-10-CM

## 2022-11-26 NOTE — Telephone Encounter (Signed)
Name from pharmacy: LISINOPRIL 40 MG TABLET  Name from pharmacy: AMLODIPINE BESYLATE 5 MG TAB  Pharmacy comment: Alternative Requested:NOT COVERED BY INSURANCE.

## 2022-12-13 ENCOUNTER — Encounter: Payer: Self-pay | Admitting: Radiology

## 2022-12-13 ENCOUNTER — Telehealth: Payer: Self-pay | Admitting: Nurse Practitioner

## 2022-12-13 ENCOUNTER — Ambulatory Visit: Payer: 59 | Admitting: Family

## 2022-12-13 DIAGNOSIS — I152 Hypertension secondary to endocrine disorders: Secondary | ICD-10-CM

## 2022-12-13 DIAGNOSIS — E1169 Type 2 diabetes mellitus with other specified complication: Secondary | ICD-10-CM

## 2022-12-13 NOTE — Telephone Encounter (Signed)
TRULICITY 3 0000000 SOPN        Changed from: Dulaglutide 3 MG/0.5ML SOPN    Pharmacy comment: Alternative Requested:REQUIRES PRIOR AUTH.

## 2022-12-14 ENCOUNTER — Other Ambulatory Visit: Payer: Self-pay | Admitting: Family

## 2022-12-14 ENCOUNTER — Other Ambulatory Visit (HOSPITAL_COMMUNITY): Payer: Self-pay

## 2022-12-14 DIAGNOSIS — E1169 Type 2 diabetes mellitus with other specified complication: Secondary | ICD-10-CM

## 2022-12-14 NOTE — Telephone Encounter (Signed)
Ran test claim, received paid claim for 30 day supply. Called pharmacy and they were able to get a paid claim. No PA needed. Pharmacy will get prescription ready

## 2022-12-17 ENCOUNTER — Encounter: Payer: Self-pay | Admitting: Family

## 2022-12-17 ENCOUNTER — Ambulatory Visit: Payer: 59 | Admitting: Family

## 2022-12-17 VITALS — BP 142/92 | HR 94 | Temp 98.1°F | Ht 79.0 in | Wt 344.0 lb

## 2022-12-17 DIAGNOSIS — M25511 Pain in right shoulder: Secondary | ICD-10-CM

## 2022-12-17 DIAGNOSIS — I152 Hypertension secondary to endocrine disorders: Secondary | ICD-10-CM | POA: Diagnosis not present

## 2022-12-17 DIAGNOSIS — E1169 Type 2 diabetes mellitus with other specified complication: Secondary | ICD-10-CM

## 2022-12-17 DIAGNOSIS — G8929 Other chronic pain: Secondary | ICD-10-CM

## 2022-12-17 DIAGNOSIS — E1159 Type 2 diabetes mellitus with other circulatory complications: Secondary | ICD-10-CM

## 2022-12-17 DIAGNOSIS — E785 Hyperlipidemia, unspecified: Secondary | ICD-10-CM | POA: Diagnosis not present

## 2022-12-17 DIAGNOSIS — Z6838 Body mass index (BMI) 38.0-38.9, adult: Secondary | ICD-10-CM | POA: Diagnosis not present

## 2022-12-17 DIAGNOSIS — K219 Gastro-esophageal reflux disease without esophagitis: Secondary | ICD-10-CM

## 2022-12-17 LAB — BAYER DCA HB A1C WAIVED: HB A1C (BAYER DCA - WAIVED): 6.6 % — ABNORMAL HIGH (ref 4.8–5.6)

## 2022-12-17 MED ORDER — AMLODIPINE BESYLATE 5 MG PO TABS: 5.0000 mg | ORAL_TABLET | Freq: Every day | ORAL | 1 refills | Status: DC

## 2022-12-17 MED ORDER — ATORVASTATIN CALCIUM 20 MG PO TABS: 20.0000 mg | ORAL_TABLET | Freq: Every day | ORAL | 3 refills | Status: DC

## 2022-12-17 MED ORDER — LISINOPRIL 40 MG PO TABS: 40.0000 mg | ORAL_TABLET | Freq: Every day | ORAL | 1 refills | Status: DC

## 2022-12-17 MED ORDER — OMEPRAZOLE 20 MG PO CPDR: DELAYED_RELEASE_CAPSULE | ORAL | 3 refills | Status: DC

## 2022-12-17 MED ORDER — METFORMIN HCL ER 500 MG PO TB24: ORAL_TABLET | ORAL | 3 refills | Status: DC

## 2022-12-17 MED ORDER — DULAGLUTIDE 3 MG/0.5ML ~~LOC~~ SOAJ: 3.0000 mg | SUBCUTANEOUS | 2 refills | Status: DC

## 2022-12-17 MED ORDER — CETIRIZINE HCL 10 MG PO TABS: 10.0000 mg | ORAL_TABLET | Freq: Every day | ORAL | 11 refills | Status: DC

## 2022-12-17 NOTE — Progress Notes (Signed)
Subjective:    Patient ID: ABDURRAHMAN Decker, male    DOB: 1985/10/25, 37 y.o.   MRN: [id number]  Chief Complaint  Patient presents with   Medical Management of Chronic Issues   Pt presents to the office today for chronic follow up.    He is followed by GI for fatty liver and followed by dietitian as needed.  He is morbid obese with a BMI of 38 and DM and HTN.    He has been Truilicity 3 mg, but having a hard time getting medication because it is on back order.      12/17/2022    2:26 PM 09/11/2022    2:11 PM 08/06/2022    1:56 PM  Last 3 Weights  Weight (lbs) 344 lb 333 lb 332 lb  Weight (kg) 156.037 kg 151.048 kg 150.594 kg     Diabetes He presents for his follow-up diabetic visit. He has type 2 diabetes mellitus. Pertinent negatives for diabetes include no blurred vision and no foot paresthesias. Symptoms are stable. Risk factors for coronary artery disease include dyslipidemia, diabetes mellitus, hypertension, male sex and sedentary lifestyle. He is following a generally healthy diet. His overall blood glucose range is 110-130 mg/dl.  Hypertension This is a chronic problem. The current episode started more than 1 year ago. The problem has been waxing and waning since onset. The problem is uncontrolled. Associated symptoms include malaise/fatigue. Pertinent negatives include no blurred vision, peripheral edema or shortness of breath. Risk factors for coronary artery disease include dyslipidemia, diabetes mellitus, male gender, obesity and sedentary lifestyle. The current treatment provides moderate improvement.  Hyperlipidemia This is a chronic problem. The current episode started more than 1 year ago. The problem is controlled. Exacerbating diseases include obesity. Pertinent negatives include no shortness of breath. Current antihyperlipidemic treatment includes statins. The current treatment provides moderate improvement of lipids. Risk factors for coronary artery disease include  diabetes mellitus, dyslipidemia, hypertension, male sex and a sedentary lifestyle.  Gastroesophageal Reflux He complains of belching, heartburn and a hoarse voice. This is a chronic problem. The current episode started more than 1 year ago. The problem occurs occasionally. Risk factors include obesity. He has tried a PPI for the symptoms. The treatment provided moderate relief.      Review of Systems  Constitutional:  Positive for malaise/fatigue.  HENT:  Positive for hoarse voice.   Eyes:  Negative for blurred vision.  Respiratory:  Negative for shortness of breath.   Gastrointestinal:  Positive for heartburn.  All other systems reviewed and are negative.      Objective:   Physical Exam Vitals reviewed.  Constitutional:      General: He is not in acute distress.    Appearance: He is well-developed. He is obese.  HENT:     Head: Normocephalic.     Right Ear: Tympanic membrane normal.     Left Ear: Tympanic membrane normal.  Eyes:     General:        Right eye: No discharge.        Left eye: No discharge.     Pupils: Pupils are equal, round, and reactive to light.  Neck:     Thyroid: No thyromegaly.  Cardiovascular:     Rate and Rhythm: Normal rate and regular rhythm.     Heart sounds: Normal heart sounds. No murmur heard. Pulmonary:     Effort: Pulmonary effort is normal. No respiratory distress.     Breath sounds: Normal breath sounds. No  wheezing.  Abdominal:     General: Bowel sounds are normal. There is no distension.     Palpations: Abdomen is soft.     Tenderness: There is no abdominal tenderness.  Musculoskeletal:        General: No tenderness. Normal range of motion.     Cervical back: Normal range of motion and neck supple.  Skin:    General: Skin is warm and dry.     Findings: No erythema or rash.  Neurological:     Mental Status: He is alert and oriented to person, place, and time.     Cranial Nerves: No cranial nerve deficit.     Deep Tendon Reflexes:  Reflexes are normal and symmetric.  Psychiatric:        Behavior: Behavior normal.        Thought Content: Thought content normal.        Judgment: Judgment normal.     Diabetic Foot Exam - Simple   Simple Foot Form Diabetic Foot exam was performed with the following findings: Yes 12/17/2022  2:39 PM  Visual Inspection See comments: Yes Sensation Testing Intact to touch and monofilament testing bilaterally: Yes Pulse Check Posterior Tibialis and Dorsalis pulse intact bilaterally: Yes Comments Hammer toes      BP (!) 157/90   Pulse 94   Temp 98.1 F (36.7 C) (Temporal)   Ht '6\' 7"'$  (2.007 m)   Wt (!) 344 lb (156 kg)   SpO2 94%   BMI 38.75 kg/m      Assessment & Plan:  Derek Decker comes in today with chief complaint of Medical Management of Chronic Issues   Diagnosis and orders addressed:  1. Hypertension associated with type 2 diabetes mellitus (HCC) - amLODipine (NORVASC) 5 MG tablet; Take 1 tablet (5 mg total) by mouth daily.  Dispense: 90 tablet; Refill: 1 - Dulaglutide 3 MG/0.5ML SOPN; Inject 3 mg into the skin once a week.  Dispense: 6 mL; Refill: 2 - lisinopril (ZESTRIL) 40 MG tablet; Take 1 tablet (40 mg total) by mouth daily.  Dispense: 90 tablet; Refill: 1 - CMP14+EGFR  2. Hyperlipidemia associated with type 2 diabetes mellitus (HCC) - atorvastatin (LIPITOR) 20 MG tablet; Take 1 tablet (20 mg total) by mouth daily.  Dispense: 90 tablet; Refill: 3 - Dulaglutide 3 MG/0.5ML SOPN; Inject 3 mg into the skin once a week.  Dispense: 6 mL; Refill: 2 - CMP14+EGFR  3. Morbid obesity (HCC) - Dulaglutide 3 MG/0.5ML SOPN; Inject 3 mg into the skin once a week.  Dispense: 6 mL; Refill: 2 - CMP14+EGFR  4. Type 2 diabetes mellitus with other specified complication, without long-term current use of insulin (HCC) - Dulaglutide 3 MG/0.5ML SOPN; Inject 3 mg into the skin once a week.  Dispense: 6 mL; Refill: 2 - metFORMIN (GLUCOPHAGE-XR) 500 MG 24 hr tablet; TAKE 1 TABLET  BY MOUTH EVERY DAY WITH BREAKFAST  Dispense: 90 tablet; Refill: 3 - CMP14+EGFR - Bayer DCA Hb A1c Waived  5. Chronic right shoulder pain - CMP14+EGFR  6. Gastroesophageal reflux disease, unspecified whether esophagitis present - omeprazole (PRILOSEC) 20 MG capsule; TAKE 1 CAPSULE BY MOUTH EVERY DAY BEFORE BREAKFAST  Dispense: 90 capsule; Refill: 3 - CMP14+EGFR   Labs pending Health Maintenance reviewed Diet and exercise encouraged  Follow up plan: 4 months    Evelina Dun, FNP

## 2022-12-17 NOTE — Patient Instructions (Signed)
Health Maintenance, Male Adopting a healthy lifestyle and getting preventive care are important in promoting health and wellness. Ask your health care provider about: The right schedule for you to have regular tests and exams. Things you can do on your own to prevent diseases and keep yourself healthy. What should I know about diet, weight, and exercise? Eat a healthy diet  Eat a diet that includes plenty of vegetables, fruits, low-fat dairy products, and lean protein. Do not eat a lot of foods that are high in solid fats, added sugars, or sodium. Maintain a healthy weight Body mass index (BMI) is a measurement that can be used to identify possible weight problems. It estimates body fat based on height and weight. Your health care provider can help determine your BMI and help you achieve or maintain a healthy weight. Get regular exercise Get regular exercise. This is one of the most important things you can do for your health. Most adults should: Exercise for at least 150 minutes each week. The exercise should increase your heart rate and make you sweat (moderate-intensity exercise). Do strengthening exercises at least twice a week. This is in addition to the moderate-intensity exercise. Spend less time sitting. Even light physical activity can be beneficial. Watch cholesterol and blood lipids Have your blood tested for lipids and cholesterol at 38 years of age, then have this test every 5 years. You may need to have your cholesterol levels checked more often if: Your lipid or cholesterol levels are high. You are older than 38 years of age. You are at high risk for heart disease. What should I know about cancer screening? Many types of cancers can be detected early and may often be prevented. Depending on your health history and family history, you may need to have cancer screening at various ages. This may include screening for: Colorectal cancer. Prostate cancer. Skin cancer. Lung  cancer. What should I know about heart disease, diabetes, and high blood pressure? Blood pressure and heart disease High blood pressure causes heart disease and increases the risk of stroke. This is more likely to develop in people who have high blood pressure readings or are overweight. Talk with your health care provider about your target blood pressure readings. Have your blood pressure checked: Every 3-5 years if you are 18-39 years of age. Every year if you are 40 years old or older. If you are between the ages of 65 and 75 and are a current or former smoker, ask your health care provider if you should have a one-time screening for abdominal aortic aneurysm (AAA). Diabetes Have regular diabetes screenings. This checks your fasting blood sugar level. Have the screening done: Once every three years after age 45 if you are at a normal weight and have a low risk for diabetes. More often and at a younger age if you are overweight or have a high risk for diabetes. What should I know about preventing infection? Hepatitis B If you have a higher risk for hepatitis B, you should be screened for this virus. Talk with your health care provider to find out if you are at risk for hepatitis B infection. Hepatitis C Blood testing is recommended for: Everyone born from 1945 through 1965. Anyone with known risk factors for hepatitis C. Sexually transmitted infections (STIs) You should be screened each year for STIs, including gonorrhea and chlamydia, if: You are sexually active and are younger than 38 years of age. You are older than 38 years of age and your   health care provider tells you that you are at risk for this type of infection. Your sexual activity has changed since you were last screened, and you are at increased risk for chlamydia or gonorrhea. Ask your health care provider if you are at risk. Ask your health care provider about whether you are at high risk for HIV. Your health care provider  may recommend a prescription medicine to help prevent HIV infection. If you choose to take medicine to prevent HIV, you should first get tested for HIV. You should then be tested every 3 months for as long as you are taking the medicine. Follow these instructions at home: Alcohol use Do not drink alcohol if your health care provider tells you not to drink. If you drink alcohol: Limit how much you have to 0-2 drinks a day. Know how much alcohol is in your drink. In the [place] one drink equals one 12 oz bottle of beer (355 mL), one 5 oz glass of wine (148 mL), or one 1 oz glass of hard liquor (44 mL). Lifestyle Do not use any products that contain nicotine or tobacco. These products include cigarettes, chewing tobacco, and vaping devices, such as e-cigarettes. If you need help quitting, ask your health care provider. Do not use street drugs. Do not share needles. Ask your health care provider for help if you need support or information about quitting drugs. General instructions Schedule regular health, dental, and eye exams. Stay current with your vaccines. Tell your health care provider if: You often feel depressed. You have ever been abused or do not feel safe at home. Summary Adopting a healthy lifestyle and getting preventive care are important in promoting health and wellness. Follow your health care provider's instructions about healthy diet, exercising, and getting tested or screened for diseases. Follow your health care provider's instructions on monitoring your cholesterol and blood pressure. This information is not intended to replace advice given to you by your health care provider. Make sure you discuss any questions you have with your health care provider. Document Revised: 02/20/2021 Document Reviewed: 02/20/2021 Elsevier Patient Education  2023 [employer] Inc.  

## 2022-12-17 NOTE — Telephone Encounter (Signed)
Name from pharmacy: [place] comment: Alternative Requested:NOT COVERED BY INSURANCE.

## 2022-12-18 LAB — CMP14+EGFR
ALT: 53 IU/L — ABNORMAL HIGH (ref 0–44)
AST: 38 IU/L (ref 0–40)
Albumin/Globulin Ratio: 1.8 (ref 1.2–2.2)
Albumin: 4.2 g/dL (ref 4.1–5.1)
Alkaline Phosphatase: 63 IU/L (ref 44–121)
BUN/Creatinine Ratio: 10 (ref 9–20)
BUN: 10 mg/dL (ref 6–20)
Bilirubin Total: 1.4 mg/dL — ABNORMAL HIGH (ref 0.0–1.2)
CO2: 21 mmol/L (ref 20–29)
Calcium: 9.5 mg/dL (ref 8.7–10.2)
Chloride: 105 mmol/L (ref 96–106)
Creatinine, Ser: 0.98 mg/dL (ref 0.76–1.27)
Globulin, Total: 2.3 g/dL (ref 1.5–4.5)
Glucose: 100 mg/dL — ABNORMAL HIGH (ref 70–99)
Potassium: 4.5 mmol/L (ref 3.5–5.2)
Sodium: 145 mmol/L — ABNORMAL HIGH (ref 134–144)
Total Protein: 6.5 g/dL (ref 6.0–8.5)
eGFR: 102 mL/min/{1.73_m2} (ref >59)

## 2022-12-21 ENCOUNTER — Other Ambulatory Visit: Payer: Self-pay | Admitting: Family

## 2022-12-21 DIAGNOSIS — E119 Type 2 diabetes mellitus without complications: Secondary | ICD-10-CM

## 2023-01-01 NOTE — Telephone Encounter (Signed)
Patient calling to get update on this. Says pharmacy is telling him to contact us about it. Please advise. If insurance is not going to cover then what are his options?

## 2023-02-25 ENCOUNTER — Telehealth: Payer: Self-pay

## 2023-02-25 NOTE — Telephone Encounter (Signed)
PRINCETON CARNAHAN (Key: [id number]) PA Case ID #: [id number] Rx #: [id number] Need Help? Call us at [phone] Status sent iconSent to Plan today Drug Trulicity 3MG /0.5ML pen-injectors ePA cloud Optician, dispensing PA Form [phone] NCPDP) Original Claim Info 75 PRIOR AUTH REQ-MD CALL [phone] REQUIRES PRIOR AUTHORIZATION(PHARMACY HELP DESK [phone])

## 2023-02-26 ENCOUNTER — Other Ambulatory Visit (HOSPITAL_COMMUNITY): Payer: Self-pay

## 2023-02-26 NOTE — Telephone Encounter (Signed)
Patient Advocate Encounter  Prior Authorization for Trulicity 3MG /0.5ML pen-injectors has been approved with [employer].    PA# [id number] Effective dates: 02/25/23 through 02/25/24  Filled at [facility]

## 2023-04-08 ENCOUNTER — Ambulatory Visit: Payer: 59 | Admitting: Family

## 2023-04-08 ENCOUNTER — Encounter: Payer: Self-pay | Admitting: Family

## 2023-04-08 VITALS — BP 128/84 | HR 96 | Temp 97.5°F | Ht 79.0 in | Wt 342.2 lb

## 2023-04-08 DIAGNOSIS — E1159 Type 2 diabetes mellitus with other circulatory complications: Secondary | ICD-10-CM | POA: Diagnosis not present

## 2023-04-08 DIAGNOSIS — K219 Gastro-esophageal reflux disease without esophagitis: Secondary | ICD-10-CM

## 2023-04-08 DIAGNOSIS — I152 Hypertension secondary to endocrine disorders: Secondary | ICD-10-CM | POA: Diagnosis not present

## 2023-04-08 DIAGNOSIS — E1169 Type 2 diabetes mellitus with other specified complication: Secondary | ICD-10-CM | POA: Diagnosis not present

## 2023-04-08 DIAGNOSIS — Z0000 Encounter for general adult medical examination without abnormal findings: Secondary | ICD-10-CM

## 2023-04-08 DIAGNOSIS — Z0001 Encounter for general adult medical examination with abnormal findings: Secondary | ICD-10-CM | POA: Diagnosis not present

## 2023-04-08 DIAGNOSIS — J4520 Mild intermittent asthma, uncomplicated: Secondary | ICD-10-CM | POA: Diagnosis not present

## 2023-04-08 DIAGNOSIS — Z6838 Body mass index (BMI) 38.0-38.9, adult: Secondary | ICD-10-CM | POA: Diagnosis not present

## 2023-04-08 DIAGNOSIS — E785 Hyperlipidemia, unspecified: Secondary | ICD-10-CM

## 2023-04-08 LAB — BAYER DCA HB A1C WAIVED: HB A1C (BAYER DCA - WAIVED): 7.1 % — ABNORMAL HIGH (ref 4.8–5.6)

## 2023-04-08 MED ORDER — MONTELUKAST SODIUM 10 MG PO TABS: 10.0000 mg | ORAL_TABLET | Freq: Every day | ORAL | 1 refills | Status: DC

## 2023-04-08 MED ORDER — ALBUTEROL SULFATE HFA 108 (90 BASE) MCG/ACT IN AERS: INHALATION_SPRAY | RESPIRATORY_TRACT | 2 refills | Status: DC

## 2023-04-08 NOTE — Progress Notes (Signed)
Subjective:    Patient ID: Derek Decker, male    DOB: 03-07-1986, 37 y.o.   MRN: [id number]  Chief Complaint  Patient presents with   Medical Management of Chronic Issues    3 month follow up    Pt presents to the office today for CPE and  chronic follow up.    He is followed by GI for fatty liver and followed by dietitian as needed.  He is morbid obese with a BMI of 38 and DM and HTN.    He has been Truilicity 3 mg, but having a hard time getting medication because it is on back order.      04/08/2023   12:07 PM 12/17/2022    2:26 PM 09/11/2022    2:11 PM  Last 3 Weights  Weight (lbs) 342 lb 3.2 oz 344 lb 333 lb  Weight (kg) 155.221 kg 156.037 kg 151.048 kg     Diabetes He presents for his follow-up diabetic visit. He has type 2 diabetes mellitus. Pertinent negatives for diabetes include no blurred vision and no foot paresthesias. Symptoms are stable. Risk factors for coronary artery disease include dyslipidemia, diabetes mellitus, hypertension and male sex. He is following a generally healthy diet. His overall blood glucose range is 110-130 mg/dl. Eye exam is not current.  Hypertension This is a chronic problem. The current episode started more than 1 year ago. The problem has been resolved since onset. The problem is controlled. Associated symptoms include malaise/fatigue. Pertinent negatives include no blurred vision, peripheral edema or shortness of breath. Risk factors for coronary artery disease include dyslipidemia, diabetes mellitus, male gender and sedentary lifestyle. The current treatment provides moderate improvement.  Gastroesophageal Reflux He complains of belching, coughing, heartburn and wheezing. This is a chronic problem. The current episode started more than 1 year ago. The problem occurs rarely. He has tried a PPI for the symptoms. The treatment provided moderate relief.  Hyperlipidemia This is a chronic problem. The current episode started more than 1 year ago.  The problem is controlled. Pertinent negatives include no shortness of breath. Current antihyperlipidemic treatment includes statins. The current treatment provides moderate improvement of lipids. Risk factors for coronary artery disease include diabetes mellitus, dyslipidemia, hypertension and a sedentary lifestyle.  Asthma He complains of cough, frequent throat clearing and wheezing. There is no shortness of breath. This is a new problem. Associated symptoms include heartburn and malaise/fatigue. His symptoms are aggravated by pollen. His symptoms are alleviated by rest. His past medical history is significant for asthma.      Review of Systems  Constitutional:  Positive for malaise/fatigue.  Eyes:  Negative for blurred vision.  Respiratory:  Positive for cough and wheezing. Negative for shortness of breath.   Gastrointestinal:  Positive for heartburn.  All other systems reviewed and are negative.  Family History  Problem Relation Age of Onset   Hypertension Mother    Hypertension Father    Liver disease Neg Hx    Colon cancer Neg Hx    Social History   Socioeconomic History   Marital status: Single    Spouse name: Not on file   Number of children: Not on file   Years of education: Not on file   Highest education level: 12th grade  Occupational History   Not on file  Tobacco Use   Smoking status: Former    Packs/day: 0.25    Years: 3.00    Additional pack years: 0.00    Total pack years:  0.75    Types: Cigarettes   Smokeless tobacco: Never  Vaping Use   Vaping Use: Never used  Substance and Sexual Activity   Alcohol use: Not Currently   Drug use: No   Sexual activity: Not on file  Other Topics Concern   Not on file  Social History Narrative   Not on file   Social Determinants of Health   Financial Resource Strain: Low Risk  (04/04/2023)   Overall Financial Resource Strain (CARDIA)    Difficulty of Paying Living Expenses: Not hard at all  Food Insecurity: No  Food Insecurity (04/04/2023)   Hunger Decker Sign    Worried About Running Out of Food in the Last Year: Never true    Ran Out of Food in the Last Year: Never true  Transportation Needs: No Transportation Needs (04/04/2023)   PRAPARE - Administrator, Civil Service (Medical): No    Lack of Transportation (Non-Medical): No  Physical Activity: Sufficiently Active (04/04/2023)   Exercise Decker Sign    Days of Exercise per Week: 7 days    Minutes of Exercise per Session: 60 min  Stress: Stress Concern Present (04/04/2023)   [employer] of Occupational Health - Occupational Stress Questionnaire    Feeling of Stress : Very much  Social Connections: Moderately Isolated (04/04/2023)   Social Connection and Isolation Panel [NHANES]    Frequency of Communication with Friends and Family: More than three times a week    Frequency of Social Gatherings with Friends and Family: Three times a week    Attends Religious Services: More than 4 times per year    Active Member of Clubs or Organizations: No    Attends Banker Meetings: Not on file    Marital Status: Never married       Objective:   Physical Exam Vitals reviewed.  Constitutional:      General: He is not in acute distress.    Appearance: He is well-developed. He is obese.  HENT:     Head: Normocephalic.     Right Ear: Tympanic membrane normal.     Left Ear: Tympanic membrane normal.  Eyes:     General:        Right eye: No discharge.        Left eye: No discharge.     Pupils: Pupils are equal, round, and reactive to light.  Neck:     Thyroid: No thyromegaly.  Cardiovascular:     Rate and Rhythm: Normal rate and regular rhythm.     Heart sounds: Normal heart sounds. No murmur heard. Pulmonary:     Effort: Pulmonary effort is normal. No respiratory distress.     Breath sounds: Normal breath sounds. No wheezing.  Abdominal:     General: Bowel sounds are normal. There is no distension.     Palpations:  Abdomen is soft.     Tenderness: There is no abdominal tenderness.  Musculoskeletal:        General: No tenderness. Normal range of motion.     Cervical back: Normal range of motion and neck supple.  Skin:    General: Skin is warm and dry.     Findings: No erythema or rash.  Neurological:     Mental Status: He is alert and oriented to person, place, and time.     Cranial Nerves: No cranial nerve deficit.     Deep Tendon Reflexes: Reflexes are normal and symmetric.  Psychiatric:  Behavior: Behavior normal.        Thought Content: Thought content normal.        Judgment: Judgment normal.       BP 128/84   Pulse 96   Temp (!) 97.5 F (36.4 C) (Temporal)   Ht 6\' 7"  (2.007 m)   Wt (!) 342 lb 3.2 oz (155.2 kg)   SpO2 99%   BMI 38.55 kg/m      Assessment & Plan:   Derek Decker comes in today with chief complaint of Medical Management of Chronic Issues (3 month follow up )   Diagnosis and orders addressed:  1. Type 2 diabetes mellitus with other specified complication, without long-term current use of insulin (HCC) - CBC with Differential/Platelet - Bayer DCA Hb A1c Waived - CMP14+EGFR  2. Morbid obesity (HCC) - CBC with Differential/Platelet - CMP14+EGFR  3. Hypertension associated with type 2 diabetes mellitus (HCC) - CBC with Differential/Platelet - CMP14+EGFR  4. Hyperlipidemia associated with type 2 diabetes mellitus (HCC) - CBC with Differential/Platelet - Lipid panel - CMP14+EGFR  5. Gastroesophageal reflux disease, unspecified whether esophagitis present - CBC with Differential/Platelet - CMP14+EGFR  6. Annual physical exam - CBC with Differential/Platelet - Lipid panel - Bayer DCA Hb A1c Waived - CMP14+EGFR - TSH  7. Mild intermittent asthma without complication Start Singulair 10 mg  Continue zyrtec 10 mg  Albuterol as needed If continues to have wheezing will need to start daily inhaler. - albuterol (VENTOLIN HFA) 108 (90 Base)  MCG/ACT inhaler; TAKE 2 PUFFS BY MOUTH EVERY 6 HOURS AS NEEDED FOR WHEEZE OR SHORTNESS OF BREATH  Dispense: 8.5 each; Refill: 2 - montelukast (SINGULAIR) 10 MG tablet; Take 1 tablet (10 mg total) by mouth at bedtime.  Dispense: 90 tablet; Refill: 1   Labs pending Health Maintenance reviewed Diet and exercise encouraged  Follow up plan: 3 months  Jannifer Rodney, FNP

## 2023-04-08 NOTE — Patient Instructions (Signed)

## 2023-04-09 LAB — CBC WITH DIFFERENTIAL/PLATELET
Basophils Absolute: 0.1 10*3/uL (ref 0.0–0.2)
Basos: 1 %
EOS (ABSOLUTE): 0.4 10*3/uL (ref 0.0–0.4)
Eos: 6 %
Hematocrit: 43.4 % (ref 37.5–51.0)
Hemoglobin: 14.6 g/dL (ref 13.0–17.7)
Immature Grans (Abs): 0 10*3/uL (ref 0.0–0.1)
Immature Granulocytes: 0 %
Lymphocytes Absolute: 2.4 10*3/uL (ref 0.7–3.1)
Lymphs: 40 %
MCH: 30.2 pg (ref 26.6–33.0)
MCHC: 33.6 g/dL (ref 31.5–35.7)
MCV: 90 fL (ref 79–97)
Monocytes Absolute: 0.3 10*3/uL (ref 0.1–0.9)
Monocytes: 6 %
Neutrophils Absolute: 2.9 10*3/uL (ref 1.4–7.0)
Neutrophils: 47 %
Platelets: 231 10*3/uL (ref 150–450)
RBC: 4.83 x10E6/uL (ref 4.14–5.80)
RDW: 11.8 % (ref 11.6–15.4)
WBC: 6 10*3/uL (ref 3.4–10.8)

## 2023-04-09 LAB — CMP14+EGFR
ALT: 57 IU/L — ABNORMAL HIGH (ref 0–44)
AST: 29 IU/L (ref 0–40)
Albumin: 4.3 g/dL (ref 4.1–5.1)
Alkaline Phosphatase: 75 IU/L (ref 44–121)
BUN/Creatinine Ratio: 10 (ref 9–20)
BUN: 10 mg/dL (ref 6–20)
Bilirubin Total: 1.1 mg/dL (ref 0.0–1.2)
CO2: 24 mmol/L (ref 20–29)
Calcium: 9.4 mg/dL (ref 8.7–10.2)
Chloride: 104 mmol/L (ref 96–106)
Creatinine, Ser: 1.02 mg/dL (ref 0.76–1.27)
Globulin, Total: 2.4 g/dL (ref 1.5–4.5)
Glucose: 169 mg/dL — ABNORMAL HIGH (ref 70–99)
Potassium: 4.5 mmol/L (ref 3.5–5.2)
Sodium: 141 mmol/L (ref 134–144)
Total Protein: 6.7 g/dL (ref 6.0–8.5)
eGFR: 97 mL/min/{1.73_m2} (ref >59)

## 2023-04-09 LAB — LIPID PANEL
Chol/HDL Ratio: 2.4 ratio (ref 0.0–5.0)
Cholesterol, Total: 121 mg/dL (ref 100–199)
HDL: 50 mg/dL (ref >39)
LDL Chol Calc (NIH): 54 mg/dL (ref 0–99)
Triglycerides: 89 mg/dL (ref 0–149)
VLDL Cholesterol Cal: 17 mg/dL (ref 5–40)

## 2023-04-09 LAB — TSH: TSH: 0.958 u[IU]/mL (ref 0.450–4.500)

## 2023-05-03 ENCOUNTER — Ambulatory Visit: Payer: 59 | Admitting: Family

## 2023-06-10 ENCOUNTER — Telehealth: Payer: Self-pay | Admitting: Family

## 2023-06-10 ENCOUNTER — Other Ambulatory Visit: Payer: Self-pay | Admitting: *Deleted

## 2023-06-10 DIAGNOSIS — E1159 Type 2 diabetes mellitus with other circulatory complications: Secondary | ICD-10-CM

## 2023-06-10 DIAGNOSIS — K219 Gastro-esophageal reflux disease without esophagitis: Secondary | ICD-10-CM

## 2023-06-10 DIAGNOSIS — E1169 Type 2 diabetes mellitus with other specified complication: Secondary | ICD-10-CM

## 2023-06-10 DIAGNOSIS — E785 Hyperlipidemia, unspecified: Secondary | ICD-10-CM

## 2023-06-10 DIAGNOSIS — J4520 Mild intermittent asthma, uncomplicated: Secondary | ICD-10-CM

## 2023-06-10 MED ORDER — AMLODIPINE BESYLATE 5 MG PO TABS
5.0000 mg | ORAL_TABLET | Freq: Every day | ORAL | 0 refills | Status: DC
Start: 2023-06-10 — End: 2023-12-02

## 2023-06-10 MED ORDER — MONTELUKAST SODIUM 10 MG PO TABS
10.0000 mg | ORAL_TABLET | Freq: Every day | ORAL | 0 refills | Status: DC
Start: 2023-06-10 — End: 2024-01-03

## 2023-06-10 MED ORDER — DEXCOM G7 SENSOR MISC: 1.0000 | 2 refills | Status: DC

## 2023-06-10 MED ORDER — LISINOPRIL 40 MG PO TABS
40.0000 mg | ORAL_TABLET | Freq: Every day | ORAL | 0 refills | Status: DC
Start: 2023-06-10 — End: 2023-10-14

## 2023-06-10 MED ORDER — ALBUTEROL SULFATE HFA 108 (90 BASE) MCG/ACT IN AERS: INHALATION_SPRAY | RESPIRATORY_TRACT | 0 refills | Status: AC

## 2023-06-10 MED ORDER — OMEPRAZOLE 20 MG PO CPDR
DELAYED_RELEASE_CAPSULE | ORAL | 0 refills | Status: DC
Start: 2023-06-10 — End: 2024-03-26

## 2023-06-10 MED ORDER — METFORMIN HCL ER 500 MG PO TB24
ORAL_TABLET | ORAL | 0 refills | Status: DC
Start: 2023-06-10 — End: 2023-10-10

## 2023-06-10 MED ORDER — ATORVASTATIN CALCIUM 20 MG PO TABS
20.0000 mg | ORAL_TABLET | Freq: Every day | ORAL | 0 refills | Status: DC
Start: 2023-06-10 — End: 2024-01-06

## 2023-06-10 NOTE — Telephone Encounter (Signed)
Pt say that insurance will no longer pay for Continuous Blood Gluc Sensor (FREESTYLE LIBRE 3 SENSOR) MISC.  He is asking if rx for Dexcom G7 can be called in. Insurance will pay for this rx.  Use [facility] mail order. Phone [phone].  Please call back when done.

## 2023-06-10 NOTE — Telephone Encounter (Signed)
Prescription sent to pharmacy.

## 2023-06-13 ENCOUNTER — Ambulatory Visit (INDEPENDENT_AMBULATORY_CARE_PROVIDER_SITE_OTHER): Payer: 59

## 2023-06-13 DIAGNOSIS — E1169 Type 2 diabetes mellitus with other specified complication: Secondary | ICD-10-CM

## 2023-06-13 LAB — HM DIABETES EYE EXAM

## 2023-06-13 NOTE — Progress Notes (Signed)
Derek Decker arrived 06/13/2023 and has given verbal consent to obtain images and complete their overdue diabetic retinal screening.  The images have been sent to an ophthalmologist or optometrist for review and interpretation.  Results will be sent back to [name], FNP for review.  Patient has been informed they will be contacted when we receive the results via telephone or MyChart

## 2023-06-25 ENCOUNTER — Telehealth: Payer: 59 | Admitting: Physician Assistant

## 2023-06-25 DIAGNOSIS — J4531 Mild persistent asthma with (acute) exacerbation: Secondary | ICD-10-CM | POA: Diagnosis not present

## 2023-06-25 MED ORDER — PREDNISONE 20 MG PO TABS: 40.0000 mg | ORAL_TABLET | Freq: Every day | ORAL | 0 refills | Status: DC

## 2023-06-25 NOTE — Progress Notes (Signed)
Message sent to patient requesting further input regarding current symptoms. Awaiting patient response.  

## 2023-06-25 NOTE — Progress Notes (Signed)
I have spent 5 minutes in review of e-visit questionnaire, review and updating patient chart, medical decision making and response to patient.   William Cody Martin, PA-C    

## 2023-06-25 NOTE — Progress Notes (Signed)
E-Visit for Asthma  Based on what you have shared with me, it looks like you may have a flare up of your asthma.  Asthma is a chronic (ongoing) lung disease which results in airway obstruction, inflammation and hyper-responsiveness.   Asthma symptoms vary from person to person, with common symptoms including nighttime awakening and decreased ability to participate in normal activities as a result of shortness of breath. It is often triggered by changes in weather, changes in the season, changes in air temperature, or inside (home, school, daycare or work) allergens such as animal dander, mold, mildew, woodstoves or cockroaches.   It can also be triggered by hormonal changes, extreme emotion, physical exertion or an upper respiratory tract illness.     It is important to identify the trigger, and then eliminate or avoid the trigger if possible.   If you have been prescribed medications to be taken on a regular basis, it is important to follow the asthma action plan and to follow guidelines to adjust medication in response to increasing symptoms of decreased peak expiratory flow rate  Treatment: I have prescribed: Prednisone 40mg  by mouth per day for 5 - 7 days. Take in addition to your regular allergy/asthma medications.  HOME CARE Only take medications as instructed by your medical team. Consider wearing a mask or scarf to improve breathing air temperature have been shown to decrease irritation and decrease exacerbations Get rest. Taking a steamy shower or using a humidifier may help nasal congestion sand ease sore throat pain. You can place a towel over your head and breathe in the steam from hot water coming from a faucet. Using a saline nasal spray works much the same way.  Cough drops, hare candies and sore throat lozenges may ease your cough.  Avoid close contacts especially the  very you and the elderly Cover your mouth if you cough or sneeze Always remember to wash your hands.    GET HELP RIGHT AWAY IF: You develop worsening symptoms; breathlessness at rest, drowsy, confused or agitated, unable to speak in full sentences You have coughing fits You develop a severe headache or visual changes You develop shortness of breath, difficulty breathing or start having chest pain Your symptoms persist after you have completed your treatment plan If your symptoms do not improve within 10 days  MAKE SURE YOU Understand these instructions. Will watch your condition. Will get help right away if you are not doing well or get worse.   Your e-visit answers were reviewed by a board certified advanced clinical practitioner to complete your personal care plan, Depending upon the condition, your plan could have included both over the counter or prescription medications.   Please review your pharmacy choice. Your safety is important to [place]. If you have drug allergies check your prescription carefully.  You can use MyChart to ask questions about today's visit, request a non-urgent  call back, or ask for a work or school excuse for 24 hours related to this e-Visit. If it has been greater than 24 hours you will need to follow up with your provider, or enter a new e-Visit to address those concerns.   You will get an e-mail in the next two days asking about your experience. I hope that your e-visit has been valuable and will speed your recovery. Thank you for using e-visits.

## 2023-07-11 ENCOUNTER — Ambulatory Visit: Payer: 59 | Admitting: Family

## 2023-07-11 ENCOUNTER — Encounter: Payer: Self-pay | Admitting: Family

## 2023-07-11 VITALS — BP 136/82 | HR 97 | Temp 97.5°F | Ht 79.0 in | Wt 334.0 lb

## 2023-07-11 DIAGNOSIS — I152 Hypertension secondary to endocrine disorders: Secondary | ICD-10-CM | POA: Diagnosis not present

## 2023-07-11 DIAGNOSIS — E1159 Type 2 diabetes mellitus with other circulatory complications: Secondary | ICD-10-CM

## 2023-07-11 DIAGNOSIS — Z23 Encounter for immunization: Secondary | ICD-10-CM | POA: Diagnosis not present

## 2023-07-11 DIAGNOSIS — K219 Gastro-esophageal reflux disease without esophagitis: Secondary | ICD-10-CM | POA: Diagnosis not present

## 2023-07-11 DIAGNOSIS — Z7985 Long-term (current) use of injectable non-insulin antidiabetic drugs: Secondary | ICD-10-CM

## 2023-07-11 DIAGNOSIS — J4520 Mild intermittent asthma, uncomplicated: Secondary | ICD-10-CM

## 2023-07-11 DIAGNOSIS — E1169 Type 2 diabetes mellitus with other specified complication: Secondary | ICD-10-CM

## 2023-07-11 DIAGNOSIS — E785 Hyperlipidemia, unspecified: Secondary | ICD-10-CM

## 2023-07-11 LAB — BAYER DCA HB A1C WAIVED: HB A1C (BAYER DCA - WAIVED): 10.9 % — ABNORMAL HIGH (ref 4.8–5.6)

## 2023-07-11 NOTE — Patient Instructions (Signed)

## 2023-07-12 ENCOUNTER — Other Ambulatory Visit: Payer: Self-pay | Admitting: Family

## 2023-07-12 LAB — CMP14+EGFR
ALT: 98 [IU]/L — ABNORMAL HIGH (ref 0–44)
AST: 67 [IU]/L — ABNORMAL HIGH (ref 0–40)
Albumin: 4.4 g/dL (ref 4.1–5.1)
Alkaline Phosphatase: 86 [IU]/L (ref 44–121)
BUN/Creatinine Ratio: 11 (ref 9–20)
BUN: 12 mg/dL (ref 6–20)
Bilirubin Total: 0.9 mg/dL (ref 0.0–1.2)
CO2: 23 mmol/L (ref 20–29)
Calcium: 9.7 mg/dL (ref 8.7–10.2)
Chloride: 101 mmol/L (ref 96–106)
Creatinine, Ser: 1.07 mg/dL (ref 0.76–1.27)
Globulin, Total: 2.5 g/dL (ref 1.5–4.5)
Glucose: 303 mg/dL — ABNORMAL HIGH (ref 70–99)
Potassium: 5.3 mmol/L — ABNORMAL HIGH (ref 3.5–5.2)
Sodium: 138 mmol/L (ref 134–144)
Total Protein: 6.9 g/dL (ref 6.0–8.5)
eGFR: 91 mL/min/{1.73_m2} (ref >59)

## 2023-07-12 LAB — CBC WITH DIFFERENTIAL/PLATELET
Basophils Absolute: 0.1 10*3/uL (ref 0.0–0.2)
Basos: 1 %
EOS (ABSOLUTE): 0.2 10*3/uL (ref 0.0–0.4)
Eos: 3 %
Hematocrit: 48.5 % (ref 37.5–51.0)
Hemoglobin: 15.4 g/dL (ref 13.0–17.7)
Immature Grans (Abs): 0 10*3/uL (ref 0.0–0.1)
Immature Granulocytes: 1 %
Lymphocytes Absolute: 2.2 10*3/uL (ref 0.7–3.1)
Lymphs: 35 %
MCH: 29.6 pg (ref 26.6–33.0)
MCHC: 31.8 g/dL (ref 31.5–35.7)
MCV: 93 fL (ref 79–97)
Monocytes Absolute: 0.3 10*3/uL (ref 0.1–0.9)
Monocytes: 5 %
Neutrophils Absolute: 3.6 10*3/uL (ref 1.4–7.0)
Neutrophils: 55 %
Platelets: 271 10*3/uL (ref 150–450)
RBC: 5.21 x10E6/uL (ref 4.14–5.80)
RDW: 11.7 % (ref 11.6–15.4)
WBC: 6.3 10*3/uL (ref 3.4–10.8)

## 2023-07-12 MED ORDER — MOUNJARO 10 MG/0.5ML ~~LOC~~ SOAJ: 10.0000 mg | SUBCUTANEOUS | 1 refills | Status: DC

## 2023-07-15 NOTE — Telephone Encounter (Signed)
Name from pharmacy: MOUNJARO 10 MG/0.5 ML PEN  Pharmacy comment: Alternative Requested:NOT COVERED BY INSURANCE.

## 2023-07-16 ENCOUNTER — Other Ambulatory Visit: Payer: Self-pay | Admitting: Family

## 2023-07-16 NOTE — Telephone Encounter (Signed)
Name from pharmacy: MOUNJARO 10 MG/0.5 ML PEN  Pharmacy comment: Alternative Requested:NON FORMULARY DRUG; PLEASE CONTACT INSURANCE OR CONSIDER ALTERNATIVE.

## 2023-07-23 ENCOUNTER — Telehealth: Payer: Self-pay | Admitting: Pharmacist

## 2023-07-26 ENCOUNTER — Encounter: Payer: Self-pay | Admitting: Pharmacist

## 2023-07-26 NOTE — Telephone Encounter (Signed)
PA denied will send appeal

## 2023-07-30 MED ORDER — TRULICITY 3 MG/0.5ML ~~LOC~~ SOAJ: 3.0000 mg | SUBCUTANEOUS | 1 refills | Status: DC

## 2023-07-30 NOTE — Addendum Note (Signed)
Addended by: Vanice Sarah D on: 07/30/2023 08:51 AM   Modules accepted: Orders

## 2023-09-10 ENCOUNTER — Ambulatory Visit: Payer: 59 | Admitting: Family

## 2023-09-16 ENCOUNTER — Ambulatory Visit: Payer: 59

## 2023-10-10 ENCOUNTER — Ambulatory Visit: Payer: 59 | Admitting: Family

## 2023-10-10 ENCOUNTER — Telehealth: Payer: 59 | Admitting: Family

## 2023-10-10 ENCOUNTER — Encounter: Payer: Self-pay | Admitting: Family

## 2023-10-10 ENCOUNTER — Other Ambulatory Visit: Payer: 59

## 2023-10-10 VITALS — Wt 320.0 lb

## 2023-10-10 DIAGNOSIS — K219 Gastro-esophageal reflux disease without esophagitis: Secondary | ICD-10-CM | POA: Diagnosis not present

## 2023-10-10 DIAGNOSIS — E785 Hyperlipidemia, unspecified: Secondary | ICD-10-CM | POA: Diagnosis not present

## 2023-10-10 DIAGNOSIS — E1159 Type 2 diabetes mellitus with other circulatory complications: Secondary | ICD-10-CM | POA: Diagnosis not present

## 2023-10-10 DIAGNOSIS — J4520 Mild intermittent asthma, uncomplicated: Secondary | ICD-10-CM

## 2023-10-10 DIAGNOSIS — I152 Hypertension secondary to endocrine disorders: Secondary | ICD-10-CM | POA: Diagnosis not present

## 2023-10-10 DIAGNOSIS — K760 Fatty (change of) liver, not elsewhere classified: Secondary | ICD-10-CM | POA: Diagnosis not present

## 2023-10-10 DIAGNOSIS — Z7984 Long term (current) use of oral hypoglycemic drugs: Secondary | ICD-10-CM | POA: Diagnosis not present

## 2023-10-10 DIAGNOSIS — E1169 Type 2 diabetes mellitus with other specified complication: Secondary | ICD-10-CM | POA: Diagnosis not present

## 2023-10-10 LAB — BAYER DCA HB A1C WAIVED: HB A1C (BAYER DCA - WAIVED): 14 % — ABNORMAL HIGH (ref 4.8–5.6)

## 2023-10-10 MED ORDER — METFORMIN HCL ER 750 MG PO TB24
1500.0000 mg | ORAL_TABLET | Freq: Every day | ORAL | 1 refills | Status: DC
Start: 2023-10-10 — End: 2024-03-17

## 2023-10-10 MED ORDER — TRESIBA FLEXTOUCH 200 UNIT/ML ~~LOC~~ SOPN
8.0000 [IU] | PEN_INJECTOR | Freq: Every evening | SUBCUTANEOUS | 2 refills | Status: DC
Start: 2023-10-10 — End: 2023-11-19

## 2023-10-10 NOTE — Progress Notes (Signed)
Virtual Visit Consent   Derek Decker, you are scheduled for a virtual visit with a [facility] provider today. Just as with appointments in the office, your consent must be obtained to participate. Your consent will be active for this visit and any virtual visit you may have with one of our providers in the next 365 days. If you have a MyChart account, a copy of this consent can be sent to you electronically.  As this is a virtual visit, video technology does not allow for your provider to perform a traditional examination. This may limit your provider's ability to fully assess your condition. If your provider identifies any concerns that need to be evaluated in person or the need to arrange testing (such as labs, EKG, etc.), we will make arrangements to do so. Although advances in technology are sophisticated, we cannot ensure that it will always work on either your end or our end. If the connection with a video visit is poor, the visit may have to be switched to a telephone visit. With either a video or telephone visit, we are not always able to ensure that we have a secure connection.  By engaging in this virtual visit, you consent to the provision of healthcare and authorize for your insurance to be billed (if applicable) for the services provided during this visit. Depending on your insurance coverage, you may receive a charge related to this service.  I need to obtain your verbal consent now. Are you willing to proceed with your visit today? MARGUETTE CONDRY has provided verbal consent on 10/10/2023 for a virtual visit (video or telephone). Jannifer Rodney, FNP  Date: 10/10/2023 8:17 AM  Virtual Visit via Video Note   I, Jannifer Rodney, connected with  Derek Decker  ([id number], June 24, 1986) on 10/10/23 at  8:25 AM EST by a video-enabled telemedicine application and verified that I am speaking with the correct person using two identifiers.  Location: Patient: Virtual Visit Location Patient:  Home Provider: Virtual Visit Location Provider: Home Office   I discussed the limitations of evaluation and management by telemedicine and the availability of in person appointments. The patient expressed understanding and agreed to proceed.    History of Present Illness: Derek Decker is a 37 y.o. who identifies as a male who was assigned male at birth, and is being seen today for  chronic follow up.    He is followed by GI for fatty liver and followed by dietitian as needed.  He is morbid obese with a BMI of 37 and DM and HTN.    He has been Truilicity 3 mg at this time. His insurance would not cover Ozempic or Mounjaro.  He has tried Ozempic and Trulicity in the past. His A1C today is elevated. We could not get his insurance to cover [facility].      10/10/2023    8:08 AM 07/11/2023   11:01 AM 04/08/2023   12:07 PM  Last 3 Weights  Weight (lbs) 320 lb 334 lb 342 lb 3.2 oz  Weight (kg) 145.151 kg 151.501 kg 155.221 kg      HPI: Diabetes He presents for his follow-up diabetic visit. He has type 2 diabetes mellitus. Pertinent negatives for diabetes include no blurred vision and no foot paresthesias. Pertinent negatives for diabetic complications include no peripheral neuropathy. Risk factors for coronary artery disease include dyslipidemia, diabetes mellitus, hypertension and sedentary lifestyle. He is following a generally unhealthy diet. His overall blood glucose range is >200 mg/dl.  Hypertension This is a chronic problem. The current episode started more than 1 year ago. Pertinent negatives include no blurred vision, malaise/fatigue, peripheral edema or shortness of breath. Risk factors for coronary artery disease include dyslipidemia, diabetes mellitus, obesity, male gender and sedentary lifestyle. The current treatment provides moderate improvement.  Asthma He complains of cough and wheezing. There is no shortness of breath. This is a chronic problem. The current episode started more  than 1 year ago. The problem occurs intermittently. Associated symptoms include heartburn. Pertinent negatives include no malaise/fatigue. His symptoms are alleviated by rest. His symptoms are not alleviated by rest. His past medical history is significant for asthma.  Gastroesophageal Reflux He complains of belching, coughing, heartburn and wheezing. This is a chronic problem. The current episode started more than 1 year ago. The problem occurs occasionally. Risk factors include obesity. He has tried a PPI for the symptoms. The treatment provided moderate relief.  Hyperlipidemia This is a chronic problem. The current episode started more than 1 year ago. The problem is controlled. Recent lipid tests were reviewed and are normal. Exacerbating diseases include obesity. Pertinent negatives include no shortness of breath. Current antihyperlipidemic treatment includes statins. The current treatment provides mild improvement of lipids. Risk factors for coronary artery disease include dyslipidemia, diabetes mellitus, hypertension and a sedentary lifestyle.    Problems:  Patient Active Problem List   Diagnosis Date Noted   Mild intermittent asthma without complication 04/08/2023   Elevated LFTs 06/27/2020   Gastroesophageal reflux disease 06/27/2020   Hepatic steatosis 04/14/2020   Hyperlipidemia associated with type 2 diabetes mellitus (HCC) 04/13/2020   Hypertension associated with type 2 diabetes mellitus (HCC) 07/10/2019   Morbid obesity (HCC) 07/10/2019   Diabetes mellitus (HCC) 02/26/2019    Allergies: No Known Allergies Medications:  Current Outpatient Medications:    insulin degludec (TRESIBA FLEXTOUCH) 200 UNIT/ML FlexTouch Pen, Inject 8 Units into the skin at bedtime., Disp: 3 mL, Rfl: 2   metFORMIN (GLUCOPHAGE-XR) 750 MG 24 hr tablet, Take 2 tablets (1,500 mg total) by mouth daily with breakfast., Disp: 180 tablet, Rfl: 1   albuterol (VENTOLIN HFA) 108 (90 Base) MCG/ACT inhaler, TAKE 2  PUFFS BY MOUTH EVERY 6 HOURS AS NEEDED FOR WHEEZE OR SHORTNESS OF BREATH, Disp: 20.1 each, Rfl: 0   amLODipine (NORVASC) 5 MG tablet, Take 1 tablet (5 mg total) by mouth daily., Disp: 90 tablet, Rfl: 0   atorvastatin (LIPITOR) 20 MG tablet, Take 1 tablet (20 mg total) by mouth daily., Disp: 90 tablet, Rfl: 0   cetirizine (ZYRTEC) 10 MG tablet, Take 1 tablet (10 mg total) by mouth daily., Disp: 30 tablet, Rfl: 11   Continuous Glucose Sensor (DEXCOM G7 SENSOR) MISC, 1 Application by Does not apply route every 14 (fourteen) days., Disp: 1 each, Rfl: 2   Dulaglutide (TRULICITY) 3 MG/0.5ML SOPN, Inject 3 mg as directed once a week., Disp: 2 mL, Rfl: 1   glucose blood (ONETOUCH VERIO) test strip, USE 1 STRIP TO CHECK GLUCOSE four times a day, Disp: 100 each, Rfl: 2   lisinopril (ZESTRIL) 40 MG tablet, Take 1 tablet (40 mg total) by mouth daily., Disp: 90 tablet, Rfl: 0   montelukast (SINGULAIR) 10 MG tablet, Take 1 tablet (10 mg total) by mouth at bedtime., Disp: 90 tablet, Rfl: 0   naproxen sodium (ANAPROX) 550 MG tablet, Take 1 tablet (550 mg total) by mouth 2 (two) times daily., Disp: 60 tablet, Rfl: 2   omeprazole (PRILOSEC) 20 MG capsule, TAKE 1 CAPSULE  BY MOUTH EVERY DAY BEFORE BREAKFAST, Disp: 90 capsule, Rfl: 0  Observations/Objective: Patient is well-developed, well-nourished in no acute distress.  Resting comfortably  at home.  Head is normocephalic, atraumatic.  No labored breathing.  Speech is clear and coherent with logical content.  Patient is alert and oriented at baseline.    Assessment and Plan: 1. Type 2 diabetes mellitus with other specified complication, without long-term current use of insulin (HCC) (Primary) - Bayer DCA Hb A1c Waived; Future - CMP14+EGFR; Future - Microalbumin / creatinine urine ratio; Future - metFORMIN (GLUCOPHAGE-XR) 750 MG 24 hr tablet; Take 2 tablets (1,500 mg total) by mouth daily with breakfast.  Dispense: 180 tablet; Refill: 1 - insulin degludec  (TRESIBA FLEXTOUCH) 200 UNIT/ML FlexTouch Pen; Inject 8 Units into the skin at bedtime.  Dispense: 3 mL; Refill: 2  2. Hypertension associated with type 2 diabetes mellitus (HCC) - CMP14+EGFR; Future  3. Morbid obesity (HCC) - CMP14+EGFR; Future  4. Hyperlipidemia associated with type 2 diabetes mellitus (HCC) - CMP14+EGFR; Future  5. Hepatic steatosis - CMP14+EGFR; Future  6. Gastroesophageal reflux disease, unspecified whether esophagitis present - CMP14+EGFR; Future  7. Mild intermittent asthma without complication - CMP14+EGFR; Future  Will start Tresiba 8 units daily, can increase 2-3 units every 3 days  Will increase metformin to 1500 mg daily from 500 mg  Strict low carb diet  Pt will come get labs today RTO in 1 month   Follow Up Instructions: I discussed the assessment and treatment plan with the patient. The patient was provided an opportunity to ask questions and all were answered. The patient agreed with the plan and demonstrated an understanding of the instructions.  A copy of instructions were sent to the patient via MyChart unless otherwise noted below.     The patient was advised to call back or seek an in-person evaluation if the symptoms worsen or if the condition fails to improve as anticipated.    Jannifer Rodney, FNP

## 2023-10-10 NOTE — Patient Instructions (Signed)

## 2023-10-11 ENCOUNTER — Other Ambulatory Visit: Payer: Self-pay

## 2023-10-11 DIAGNOSIS — E1169 Type 2 diabetes mellitus with other specified complication: Secondary | ICD-10-CM

## 2023-10-11 LAB — CMP14+EGFR
ALT: 95 [IU]/L — ABNORMAL HIGH (ref 0–44)
AST: 65 [IU]/L — ABNORMAL HIGH (ref 0–40)
Albumin: 4.5 g/dL (ref 4.1–5.1)
Alkaline Phosphatase: 101 [IU]/L (ref 44–121)
BUN/Creatinine Ratio: 13 (ref 9–20)
BUN: 14 mg/dL (ref 6–20)
Bilirubin Total: 1 mg/dL (ref 0.0–1.2)
CO2: 24 mmol/L (ref 20–29)
Calcium: 10.2 mg/dL (ref 8.7–10.2)
Chloride: 99 mmol/L (ref 96–106)
Creatinine, Ser: 1.1 mg/dL (ref 0.76–1.27)
Globulin, Total: 2.6 g/dL (ref 1.5–4.5)
Glucose: 352 mg/dL — ABNORMAL HIGH (ref 70–99)
Potassium: 4.6 mmol/L (ref 3.5–5.2)
Sodium: 139 mmol/L (ref 134–144)
Total Protein: 7.1 g/dL (ref 6.0–8.5)
eGFR: 88 mL/min/{1.73_m2} (ref >59)

## 2023-10-11 LAB — MICROALBUMIN / CREATININE URINE RATIO
Creatinine, Urine: 47.7 mg/dL
Microalb/Creat Ratio: 41 mg/g{creat} — ABNORMAL HIGH (ref 0–29)
Microalbumin, Urine: 19.4 ug/mL

## 2023-10-13 ENCOUNTER — Other Ambulatory Visit: Payer: Self-pay | Admitting: Family

## 2023-10-13 DIAGNOSIS — E1159 Type 2 diabetes mellitus with other circulatory complications: Secondary | ICD-10-CM

## 2023-10-22 ENCOUNTER — Other Ambulatory Visit: Payer: Self-pay | Admitting: Family Medicine

## 2023-10-22 ENCOUNTER — Telehealth: Payer: Self-pay

## 2023-10-22 DIAGNOSIS — I152 Hypertension secondary to endocrine disorders: Secondary | ICD-10-CM

## 2023-10-22 MED ORDER — LISINOPRIL 40 MG PO TABS
40.0000 mg | ORAL_TABLET | Freq: Every day | ORAL | 0 refills | Status: DC
Start: 2023-10-22 — End: 2024-02-03

## 2023-10-22 NOTE — Progress Notes (Signed)
 Care Guide Pharmacy Note  10/22/2023 Name: DUSTIN BUMBAUGH MRN: [id number] DOB: [birth year]  Referred By: Lavell Bari LABOR, FNP Reason for referral: Care Coordination (Outreach to schedule with Pharm d )   GILAD DUGGER is a 38 y.o. year old male who is a primary care patient of Lavell Bari LABOR, FNP.  [name] was referred to the pharmacist for assistance related to: DMII  Successful contact was made with the patient to discuss pharmacy services including being ready for the pharmacist to call at least 5 minutes before the scheduled appointment time and to have medication bottles and any blood pressure readings ready for review. The patient agreed to meet with the pharmacist via telephone visit on (date/time).11/19/2023  Jeoffrey Buffalo , RMA     [place]  [facility], [facility] Medicine Guide  Direct Dial: [phone]  Website: [email or web address]

## 2023-10-23 ENCOUNTER — Telehealth: Payer: 59 | Admitting: Family Medicine

## 2023-10-23 DIAGNOSIS — J069 Acute upper respiratory infection, unspecified: Secondary | ICD-10-CM | POA: Diagnosis not present

## 2023-10-23 MED ORDER — BENZONATATE 100 MG PO CAPS: 100.0000 mg | ORAL_CAPSULE | Freq: Three times a day (TID) | ORAL | 0 refills | Status: DC | PRN

## 2023-10-23 MED ORDER — PROMETHAZINE-DM 6.25-15 MG/5ML PO SYRP: 5.0000 mL | ORAL_SOLUTION | Freq: Four times a day (QID) | ORAL | 0 refills | Status: DC | PRN

## 2023-10-23 NOTE — Patient Instructions (Signed)
 Derek Decker, thank you for joining Derek CHRISTELLA Barefoot, NP for today's virtual visit.  While this provider is not your primary care provider (PCP), if your PCP is located in our provider database this encounter information will be shared with them immediately following your visit.   A [facility] MyChart account gives you access to today's visit and all your visits, tests, and labs performed at [facility]  click here if you don't have a [name] or go to [email or web address]  Consent: (Patient) Derek Decker provided verbal consent for this virtual visit at the beginning of the encounter.  Current Medications:  Current Outpatient Medications:    benzonatate  (TESSALON ) 100 MG capsule, Take 1 capsule (100 mg total) by mouth 3 (three) times daily as needed for cough., Disp: 30 capsule, Rfl: 0   promethazine -dextromethorphan (PROMETHAZINE -DM) 6.25-15 MG/5ML syrup, Take 5 mLs by mouth 4 (four) times daily as needed for cough., Disp: 118 mL, Rfl: 0   albuterol  (VENTOLIN  HFA) 108 (90 Base) MCG/ACT inhaler, TAKE 2 PUFFS BY MOUTH EVERY 6 HOURS AS NEEDED FOR WHEEZE OR SHORTNESS OF BREATH, Disp: 20.1 each, Rfl: 0   amLODipine  (NORVASC ) 5 MG tablet, Take 1 tablet (5 mg total) by mouth daily., Disp: 90 tablet, Rfl: 0   atorvastatin  (LIPITOR) 20 MG tablet, Take 1 tablet (20 mg total) by mouth daily., Disp: 90 tablet, Rfl: 0   cetirizine  (ZYRTEC ) 10 MG tablet, Take 1 tablet (10 mg total) by mouth daily., Disp: 30 tablet, Rfl: 11   Continuous Glucose Sensor (DEXCOM G7 SENSOR) MISC, 1 Application by Does not apply route every 14 (fourteen) days., Disp: 1 each, Rfl: 2   Dulaglutide  (TRULICITY ) 3 MG/0.5ML SOPN, Inject 3 mg as directed once a week., Disp: 2 mL, Rfl: 1   glucose blood (ONETOUCH VERIO) test strip, USE 1 STRIP TO CHECK GLUCOSE four times a day, Disp: 100 each, Rfl: 2   insulin degludec  (TRESIBA  FLEXTOUCH) 200 UNIT/ML FlexTouch Pen, Inject 8 Units into the skin at  bedtime., Disp: 3 mL, Rfl: 2   lisinopril  (ZESTRIL ) 40 MG tablet, Take 1 tablet (40 mg total) by mouth daily., Disp: 90 tablet, Rfl: 0   metFORMIN  (GLUCOPHAGE -XR) 750 MG 24 hr tablet, Take 2 tablets (1,500 mg total) by mouth daily with breakfast., Disp: 180 tablet, Rfl: 1   montelukast  (SINGULAIR ) 10 MG tablet, Take 1 tablet (10 mg total) by mouth at bedtime., Disp: 90 tablet, Rfl: 0   naproxen  sodium (ANAPROX ) 550 MG tablet, Take 1 tablet (550 mg total) by mouth 2 (two) times daily., Disp: 60 tablet, Rfl: 2   omeprazole  (PRILOSEC) 20 MG capsule, TAKE 1 CAPSULE BY MOUTH EVERY DAY BEFORE BREAKFAST, Disp: 90 capsule, Rfl: 0   Medications ordered in this encounter:  Meds ordered this encounter  Medications   promethazine -dextromethorphan (PROMETHAZINE -DM) 6.25-15 MG/5ML syrup    Sig: Take 5 mLs by mouth 4 (four) times daily as needed for cough.    Dispense:  118 mL    Refill:  0    Supervising Provider:   LAMPTEY, PHILIP O [id number]   benzonatate  (TESSALON ) 100 MG capsule    Sig: Take 1 capsule (100 mg total) by mouth 3 (three) times daily as needed for cough.    Dispense:  30 capsule    Refill:  0    Supervising Provider:   BLAISE ALEENE KIDD [[id number]]     *If you need refills on other medications prior to your next appointment, please contact your pharmacy*  Follow-Up: Call back or seek an in-person evaluation if the symptoms worsen or if the condition fails to improve as anticipated.  [name] [phone]  Other Instructions  URI recommendations: - Increased rest - Increasing Fluids - Acetaminophen  / ibuprofen as needed for fever/pain.  - Salt water gargling, chloraseptic spray and throat lozenges - Mucinex if mucus is present and increasing.  - Saline nasal spray if congestion or if nasal passages feel dry. - Humidifying the air.     If you have been instructed to have an in-person evaluation today at a local Urgent Care facility, please use the link  below. It will take you to a list of all of our available [facility], including address, phone number and hours of operation. Please do not delay care.  [facility]  If you or a family member do not have a primary care provider, use the link below to schedule a visit and establish care. When you choose a [facility] primary care physician or advanced practice provider, you gain a long-term partner in health. Find a Primary Care Provider  Learn more about [facility]'s in-office and virtual care options:  - Get Care Now

## 2023-10-23 NOTE — Progress Notes (Signed)
 Virtual Visit Consent   Derek Decker, you are scheduled for a virtual visit with a [facility] provider today. Just as with appointments in the office, your consent must be obtained to participate. Your consent will be active for this visit and any virtual visit you may have with one of our providers in the next 365 days. If you have a MyChart account, a copy of this consent can be sent to you electronically.  As this is a virtual visit, video technology does not allow for your provider to perform a traditional examination. This may limit your provider's ability to fully assess your condition. If your provider identifies any concerns that need to be evaluated in person or the need to arrange testing (such as labs, EKG, etc.), we will make arrangements to do so. Although advances in technology are sophisticated, we cannot ensure that it will always work on either your end or our end. If the connection with a video visit is poor, the visit may have to be switched to a telephone visit. With either a video or telephone visit, we are not always able to ensure that we have a secure connection.  By engaging in this virtual visit, you consent to the provision of healthcare and authorize for your insurance to be billed (if applicable) for the services provided during this visit. Depending on your insurance coverage, you may receive a charge related to this service.  I need to obtain your verbal consent now. Are you willing to proceed with your visit today? Derek Decker has provided verbal consent on 10/23/2023 for a virtual visit (video or telephone). Derek CHRISTELLA Barefoot, NP  Date: 10/23/2023 12:12 PM  Virtual Visit via Video Note   I, Derek Decker, connected with  Derek Decker  ([id number], Jan 29, 1986) on 10/23/23 at 12:15 PM EST by a video-enabled telemedicine application and verified that I am speaking with the correct person using two identifiers.  Location: Patient: Virtual Visit Location Patient:  Home Provider: Virtual Visit Location Provider: Home Office   I discussed the limitations of evaluation and management by telemedicine and the availability of in person appointments. The patient expressed understanding and agreed to proceed.    History of Present Illness: Derek Decker is a 38 y.o. who identifies as a male who was assigned male at birth, and is being seen today for cough.  Onset was started 2-3 days ago with cough Associated symptoms are cough-mucus- that is yellow green, congestion, eyes watering, headaches Modifying factors are robitussin, and cold and sinus medications OTC Denies chest pain, shortness of breath, fevers, chills  Exposure to sick contacts- unknown COVID test: neg  Vaccines: Flu   Problems:  Patient Active Problem List   Diagnosis Date Noted   Mild intermittent asthma without complication 04/08/2023   Elevated LFTs 06/27/2020   Gastroesophageal reflux disease 06/27/2020   Hepatic steatosis 04/14/2020   Hyperlipidemia associated with type 2 diabetes mellitus (HCC) 04/13/2020   Hypertension associated with type 2 diabetes mellitus (HCC) 07/10/2019   Morbid obesity (HCC) 07/10/2019   Diabetes mellitus (HCC) 02/26/2019    Allergies: No Known Allergies Medications:  Current Outpatient Medications:    albuterol  (VENTOLIN  HFA) 108 (90 Base) MCG/ACT inhaler, TAKE 2 PUFFS BY MOUTH EVERY 6 HOURS AS NEEDED FOR WHEEZE OR SHORTNESS OF BREATH, Disp: 20.1 each, Rfl: 0   amLODipine  (NORVASC ) 5 MG tablet, Take 1 tablet (5 mg total) by mouth daily., Disp: 90 tablet, Rfl: 0   atorvastatin  (LIPITOR) 20  MG tablet, Take 1 tablet (20 mg total) by mouth daily., Disp: 90 tablet, Rfl: 0   cetirizine  (ZYRTEC ) 10 MG tablet, Take 1 tablet (10 mg total) by mouth daily., Disp: 30 tablet, Rfl: 11   Continuous Glucose Sensor (DEXCOM G7 SENSOR) MISC, 1 Application by Does not apply route every 14 (fourteen) days., Disp: 1 each, Rfl: 2   Dulaglutide  (TRULICITY ) 3 MG/0.5ML SOPN,  Inject 3 mg as directed once a week., Disp: 2 mL, Rfl: 1   glucose blood (ONETOUCH VERIO) test strip, USE 1 STRIP TO CHECK GLUCOSE four times a day, Disp: 100 each, Rfl: 2   insulin degludec  (TRESIBA  FLEXTOUCH) 200 UNIT/ML FlexTouch Pen, Inject 8 Units into the skin at bedtime., Disp: 3 mL, Rfl: 2   lisinopril  (ZESTRIL ) 40 MG tablet, Take 1 tablet (40 mg total) by mouth daily., Disp: 90 tablet, Rfl: 0   metFORMIN  (GLUCOPHAGE -XR) 750 MG 24 hr tablet, Take 2 tablets (1,500 mg total) by mouth daily with breakfast., Disp: 180 tablet, Rfl: 1   montelukast  (SINGULAIR ) 10 MG tablet, Take 1 tablet (10 mg total) by mouth at bedtime., Disp: 90 tablet, Rfl: 0   naproxen  sodium (ANAPROX ) 550 MG tablet, Take 1 tablet (550 mg total) by mouth 2 (two) times daily., Disp: 60 tablet, Rfl: 2   omeprazole  (PRILOSEC) 20 MG capsule, TAKE 1 CAPSULE BY MOUTH EVERY DAY BEFORE BREAKFAST, Disp: 90 capsule, Rfl: 0  Observations/Objective: Patient is well-developed, well-nourished in no acute distress.  Resting comfortably  at home.  Head is normocephalic, atraumatic.  No labored breathing.  Speech is clear and coherent with logical content.  Patient is alert and oriented at baseline.    Assessment and Plan:  1. Viral URI with cough (Primary)  - promethazine -dextromethorphan (PROMETHAZINE -DM) 6.25-15 MG/5ML syrup; Take 5 mLs by mouth 4 (four) times daily as needed for cough.  Dispense: 118 mL; Refill: 0 - benzonatate  (TESSALON ) 100 MG capsule; Take 1 capsule (100 mg total) by mouth 3 (three) times daily as needed for cough.  Dispense: 30 capsule; Refill: 0   - Increased rest - Increasing Fluids - Acetaminophen  / ibuprofen as needed for fever/pain.  - Salt water gargling, chloraseptic spray and throat lozenges - Mucinex if mucus is present and increasing.  - Saline nasal spray if congestion or if nasal passages feel dry. - Humidifying the air.   Reviewed side effects, risks and benefits of medication.     Patient acknowledged agreement and understanding of the plan.   Past Medical, Surgical, Social History, Allergies, and Medications have been Reviewed.    Follow Up Instructions: I discussed the assessment and treatment plan with the patient. The patient was provided an opportunity to ask questions and all were answered. The patient agreed with the plan and demonstrated an understanding of the instructions.  A copy of instructions were sent to the patient via MyChart unless otherwise noted below.    The patient was advised to call back or seek an in-person evaluation if the symptoms worsen or if the condition fails to improve as anticipated.    Derek CHRISTELLA Barefoot, NP

## 2023-11-16 ENCOUNTER — Other Ambulatory Visit: Payer: Self-pay | Admitting: Family

## 2023-11-16 DIAGNOSIS — I152 Hypertension secondary to endocrine disorders: Secondary | ICD-10-CM

## 2023-11-16 DIAGNOSIS — E1169 Type 2 diabetes mellitus with other specified complication: Secondary | ICD-10-CM

## 2023-11-18 NOTE — Progress Notes (Signed)
 11/19/2023 Name: Derek Decker MRN: [id number] DOB: 08-22-1986  Chief Complaint  Patient presents with   Diabetes    Derek Decker is a 38 y.o. year old male who was referred for medication management by their primary care provider, Lavell Bari LABOR, FNP. They presented for a face to face visit today.   They were referred to the pharmacist by their PCP for assistance in managing diabetes    Subjective:  Care Team: Primary Care Provider: Lavell Bari LABOR, FNP ; Next Scheduled Visit: not scheduled   Medication Access/Adherence  [facility] Pharmacy[facility]  [phone] [place] [place] - [address] [place] [address] Phone: [phone] Fax: [phone]  [facility] [place], [place] - [address] [place] [address] Phone: [phone] Fax: [phone]   Patient reports affordability concerns with their medications: No  Patient reports access/transportation concerns to their pharmacy: No  Patient reports adherence concerns with their medications:  No     Diabetes:  Current medications: Trulicity  3 mg weekly (not taking) Tresiba  8 units daily, metformin  XR 1500 mg daily Medications tried in the past: Ozempic  (changed due to insurance coverage)  Patient reported he thought he was supposed to stop taking Trulicity  when he started taking Tresiba . Has not taken Trulicity  in 2 months and BG has increased. He reports significant increase in urinary frequency since then.  Using Dexcom g7 CGM, but currently out of sensors. Plans to pick up from pharmacy today. Date of Download: 11/19/2023  Past 30 days: Average Glucose: 380 mg/dL Glucose Management Indicator: 12.4  Time in Goal:  - Time in range 70-180: 0% - Time above range: 100% - Time below range: 0%  Patient denies hypoglycemic s/sx including dizziness, shakiness, sweating. Patient reports hyperglycemic symptoms including nocturia. Denies polydipsia, polyphagia,  neuropathy, blurred vision.  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 Sig Documenting Provider Last Dose Status Informant  albuterol  (VENTOLIN  HFA) 108 (90 Base) MCG/ACT inhaler [id number]  TAKE 2 PUFFS BY MOUTH EVERY 6 HOURS AS NEEDED FOR WHEEZE OR SHORTNESS OF BREATH Hawks, Christy A, FNP  Active   amLODipine  (NORVASC ) 5 MG tablet [id number]  Take 1 tablet (5 mg total) by mouth daily. Lavell Bari A, FNP  Active   atorvastatin  (LIPITOR) 20 MG tablet [id number]  Take 1 tablet (20 mg total) by mouth daily. Lavell Bari A, FNP  Active   benzonatate  (TESSALON ) 100 MG capsule [id number]  Take 1 capsule (100 mg total) by mouth 3 (three) times  daily as needed for cough. Moishe Chiquita HERO, NP  Active   cetirizine  (ZYRTEC ) 10 MG tablet [id number]  Take 1 tablet (10 mg total) by mouth daily. Lavell Bari LABOR, FNP  Active   Continuous Glucose Sensor (DEXCOM G7 SENSOR) [place] [id number]  CHANGE SENSOR EVERY 10 DAYS OR AS DIRECTED Lavell Bari A, FNP  Active   Dulaglutide  (TRULICITY ) 3 MG/0.5ML SOPN [id number]  Inject 3 mg as directed once a week. Lavell Bari A, FNP  Active   glucose blood (ONETOUCH VERIO)  test strip [id number]  USE 1 STRIP TO CHECK GLUCOSE four times a day Lavell Bari A, FNP  Active   insulin degludec  (TRESIBA  FLEXTOUCH) 200 UNIT/ML FlexTouch Pen [id number] Yes Inject 8 Units into the skin at bedtime. Lavell Bari LABOR, FNP Taking Active   lisinopril  (ZESTRIL ) 40 MG tablet [id number]  Take 1 tablet (40 mg total) by mouth daily. Lavell Bari A, FNP  Active   metFORMIN  (GLUCOPHAGE -XR) 750 MG 24 hr tablet [id number]  Take 2 tablets (1,500 mg total) by mouth daily with breakfast. Lavell Bari A, FNP  Active   montelukast  (SINGULAIR ) 10 MG tablet [id number] Yes Take 1 tablet (10 mg total) by mouth at bedtime. Lavell Bari LABOR, FNP Taking Active   naproxen  sodium (ANAPROX ) 550 MG tablet [id number]  Take 1 tablet (550 mg total) by mouth 2 (two) times daily. Lavell Bari A, FNP  Active   omeprazole  (PRILOSEC) 20 MG capsule [id number]  TAKE 1 CAPSULE BY MOUTH EVERY DAY BEFORE BREAKFAST Hawks, Christy A, FNP  Active   promethazine -dextromethorphan (PROMETHAZINE -DM) 6.25-15 MG/5ML syrup [id number]  Take 5 mLs by mouth 4 (four) times daily as needed for cough. Moishe Chiquita HERO, NP  Active               Assessment/Plan:   Diabetes: - Currently uncontrolled - last A1c 14.0% on 10/10/2023 and Dexcom report shows time above range 100%. Patient misunderstood instructions at last visit and stopped taking Trulicity  when he started Tresiba . Counseled on appropriate use of Tresiba  and Trulicity  together. Will restart Trulicity  at 0.75 mg dose today given he has been off of it for 2 months and increase Tresiba  given symptomatic hyperglycemia. - Reviewed long term cardiovascular and renal outcomes of uncontrolled blood sugar - Reviewed goal A1c, goal fasting, and goal 2 hour post prandial glucose - Reviewed dietary modifications including reducing intake of sugary drinks including soda and sweet tea.  - UACR 41 mg/g - currently on ACE for hypertension, once hyperglycemia improves consider addition of  SGLT-2 - Recommend to increase Tresiba  to 12 units daily - Recommend to start Trulicity  0.75 mg weekly  - Patient denies personal or family history of multiple endocrine neoplasia type 2, medullary thyroid  cancer; personal history of pancreatitis or gallbladder disease. - Recommend to check glucose continuously using Dexcom g7CGM    Follow Up Plan: PharmD on 12/17/2023  Izetta Henry, PharmD PGY-1 Pharmacy Resident  Mliss Tarry Griffin, PharmD, BCACP, CPP Clinical Pharmacist, [facility]

## 2023-11-19 ENCOUNTER — Ambulatory Visit (INDEPENDENT_AMBULATORY_CARE_PROVIDER_SITE_OTHER): Payer: 59 | Admitting: Pharmacist

## 2023-11-19 DIAGNOSIS — Z794 Long term (current) use of insulin: Secondary | ICD-10-CM | POA: Diagnosis not present

## 2023-11-19 DIAGNOSIS — Z7985 Long-term (current) use of injectable non-insulin antidiabetic drugs: Secondary | ICD-10-CM | POA: Diagnosis not present

## 2023-11-19 DIAGNOSIS — E119 Type 2 diabetes mellitus without complications: Secondary | ICD-10-CM

## 2023-11-19 DIAGNOSIS — E1169 Type 2 diabetes mellitus with other specified complication: Secondary | ICD-10-CM

## 2023-11-19 MED ORDER — TRESIBA FLEXTOUCH 200 UNIT/ML ~~LOC~~ SOPN: 12.0000 [IU] | PEN_INJECTOR | Freq: Every evening | SUBCUTANEOUS | 2 refills | Status: DC

## 2023-11-19 MED ORDER — TRULICITY 0.75 MG/0.5ML ~~LOC~~ SOAJ: 0.7500 mg | SUBCUTANEOUS | 0 refills | Status: DC

## 2023-12-01 ENCOUNTER — Other Ambulatory Visit: Payer: Self-pay | Admitting: Family

## 2023-12-01 DIAGNOSIS — E1159 Type 2 diabetes mellitus with other circulatory complications: Secondary | ICD-10-CM

## 2023-12-17 ENCOUNTER — Ambulatory Visit (INDEPENDENT_AMBULATORY_CARE_PROVIDER_SITE_OTHER): Payer: 59 | Admitting: Pharmacist

## 2023-12-17 ENCOUNTER — Encounter: Payer: Self-pay | Admitting: Pharmacist

## 2023-12-17 DIAGNOSIS — K219 Gastro-esophageal reflux disease without esophagitis: Secondary | ICD-10-CM | POA: Diagnosis not present

## 2023-12-17 DIAGNOSIS — Z6836 Body mass index (BMI) 36.0-36.9, adult: Secondary | ICD-10-CM | POA: Diagnosis not present

## 2023-12-17 DIAGNOSIS — J45909 Unspecified asthma, uncomplicated: Secondary | ICD-10-CM | POA: Diagnosis not present

## 2023-12-17 DIAGNOSIS — K760 Fatty (change of) liver, not elsewhere classified: Secondary | ICD-10-CM | POA: Diagnosis not present

## 2023-12-17 DIAGNOSIS — Z7985 Long-term (current) use of injectable non-insulin antidiabetic drugs: Secondary | ICD-10-CM | POA: Diagnosis not present

## 2023-12-17 DIAGNOSIS — Z720 Tobacco use: Secondary | ICD-10-CM | POA: Diagnosis not present

## 2023-12-17 DIAGNOSIS — Z794 Long term (current) use of insulin: Secondary | ICD-10-CM

## 2023-12-17 DIAGNOSIS — Z7984 Long term (current) use of oral hypoglycemic drugs: Secondary | ICD-10-CM

## 2023-12-17 DIAGNOSIS — E785 Hyperlipidemia, unspecified: Secondary | ICD-10-CM | POA: Diagnosis not present

## 2023-12-17 DIAGNOSIS — E1169 Type 2 diabetes mellitus with other specified complication: Secondary | ICD-10-CM

## 2023-12-17 DIAGNOSIS — E119 Type 2 diabetes mellitus without complications: Secondary | ICD-10-CM | POA: Diagnosis not present

## 2023-12-17 DIAGNOSIS — I10 Essential (primary) hypertension: Secondary | ICD-10-CM | POA: Diagnosis not present

## 2023-12-17 DIAGNOSIS — Z8249 Family history of ischemic heart disease and other diseases of the circulatory system: Secondary | ICD-10-CM | POA: Diagnosis not present

## 2023-12-17 MED ORDER — TRULICITY 1.5 MG/0.5ML ~~LOC~~ SOAJ: 1.5000 mg | SUBCUTANEOUS | 2 refills | Status: DC

## 2023-12-17 MED ORDER — TRESIBA FLEXTOUCH 200 UNIT/ML ~~LOC~~ SOPN: 16.0000 [IU] | PEN_INJECTOR | Freq: Every evening | SUBCUTANEOUS | 2 refills | Status: DC

## 2023-12-17 MED ORDER — PEN NEEDLES 32G X 6 MM MISC: 3 refills | Status: AC

## 2023-12-17 NOTE — Progress Notes (Signed)
 12/17/2023 Name: Derek Decker MRN: [id number] DOB: 1986/07/26  Chief Complaint  Patient presents with   Diabetes    Derek Decker is a 38 y.o. year old male who was referred for medication management by their primary care provider, [name], FNP. They presented for a face to face visit today.   They were referred to the pharmacist by their PCP for assistance in managing diabetes   Patient was identified as falling into the [place] Measure - Diabetes.   Patient was: Referred to pharmacy for chronic disease management.   Subjective:  Care Team: Primary Care Provider: [name], FNP ; Next Scheduled Visit: 01/17/2024  Medication Access/Adherence  [facility]  [phone] [place] [place] - [address] [place] De[place] [address] [place] [address] Phone: [phone] Fax: [phone]  [facility] [place] - [address] [place] [address] Phone: [phone] Fax: [phone]   Patient reports affordability concerns with their medications: No  Patient reports access/transportation concerns to their pharmacy: No  Patient reports adherence concerns with their medications:  No     Diabetes:  Current medications: Trulicity 0.75 mg weely (Sundays), Evaristo Bury 12 units daily (PM), metformin XR 1500 mg daily Medications tried in the past: Ozempic (changed d/t insurance coverage)  Patient reports no change in blood sugar readings since restarting Trulicity and increasing Tresiba at last visit.   Using Dexcom G7 CGM Date of Download: 12/17/23  Past 14 days: Average Glucose: 360 mg/dL Time in Goal:  - Time in range 70-180: 0% - Time above range: 99% - Time below range: 0%  Needs pen needles   Patient denies hypoglycemic s/sx including dizziness, shakiness, sweating. Patient reports hyperglycemic symptoms including nocturia and occasional polydipsia. Although feels nocturia is improving. Denies polyphagia,  neuropathy, and blurred vision.  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 Sig Documenting Provider Last Dose Status Informant  albuterol (VENTOLIN HFA) 108 (90 Base) MCG/ACT inhaler [id number]  TAKE 2 PUFFS BY MOUTH EVERY 6 HOURS AS NEEDED FOR WHEEZE OR SHORTNESS OF BREATH Hawks, Christy A, FNP  Active   amLODipine (NORVASC) 5 MG tablet [id number]  TAKE 1 TABLET (5 MG TOTAL) BY MOUTH DAILY. Jannifer Rodney A, FNP  Active   atorvastatin (LIPITOR) 20 MG tablet [id number]  Take 1 tablet (20 mg total) by mouth daily. Jannifer Rodney A, FNP  Active   benzonatate (TESSALON) 100 MG capsule [id number]  Take 1 capsule (100 mg total) by mouth  3 (three) times daily as needed for cough. [name], NP  Active   cetirizine (ZYRTEC) 10 MG tablet [id number]  Take 1 tablet (10 mg total) by mouth daily. [name], FNP  Active   Continuous Glucose Sensor (DEXCOM G7 SENSOR) [place] [id number]  CHANGE SENSOR EVERY 10 DAYS OR AS DIRECTED Jannifer Rodney A, FNP  Active   Dulaglutide (TRULICITY) 1.5 MG/0.5ML [place] [id number] Yes Inject 1.5 mg into the skin once a week. Jannifer Rodney A, FNP  Active    glucose blood (ONETOUCH VERIO) test strip [id number]  USE 1 STRIP TO CHECK GLUCOSE four times a day Hawks, Christy A, FNP  Active   insulin degludec (TRESIBA FLEXTOUCH) 200 UNIT/ML FlexTouch Pen [id number] Yes Inject 16 Units into the skin at bedtime. [name], FNP Taking Active   Insulin Pen Needle (PEN NEEDLES) 32G X 6 MM MISC [id number] Yes Use to inject insulin daily Jannifer Rodney A, FNP  Active   lisinopril (ZESTRIL) 40 MG tablet [id number]  Take 1 tablet (40 mg total) by mouth daily. Jannifer Rodney A, FNP  Active   metFORMIN (GLUCOPHAGE-XR) 750 MG 24 hr tablet [id number]  Take 2 tablets (1,500 mg total) by mouth daily with breakfast. Jannifer Rodney A, FNP  Active   montelukast (SINGULAIR) 10 MG tablet [id number]  Take 1 tablet (10 mg total) by mouth at bedtime. Jannifer Rodney A, FNP  Active   naproxen sodium (ANAPROX) 550 MG tablet [id number]  Take 1 tablet (550 mg total) by mouth 2 (two) times daily. Jannifer Rodney A, FNP  Active   omeprazole (PRILOSEC) 20 MG capsule [id number]  TAKE 1 CAPSULE BY MOUTH EVERY DAY BEFORE BREAKFAST Hawks, Christy A, FNP  Active   promethazine-dextromethorphan (PROMETHAZINE-DM) 6.25-15 MG/5ML syrup [id number]  Take 5 mLs by mouth 4 (four) times daily as needed for cough. [name], NP  Active               Assessment/Plan:   Diabetes: - Currently uncontrolled with last A1c 14.0% on 10/10/2023. Despite restarting Trulicity and increasing Tresiba at last visit, Dexcom shows time above range still 99%. He is tolerating Trulicity well with no GI side effects so we will increase the dose. Given ongoing hyperglycemia will also further increase insulin.  - Reviewed long term cardiovascular and renal outcomes of uncontrolled blood sugar - Reviewed goal A1c, goal fasting, and goal 2 hour post prandial glucose - Reviewed dietary modifications including: commended him for making dietary changes and encouraged him to continue focus on limiting intake of sugary  drinks  - Recommend to increase Trulicity to 1.5 mg weekly - Recommend to increase [place] to 16 units daily - Continue metformin XR 1500 mg daily - UACR 41 mg/g - currently on ACE for hypertension, once hyperglycemia improves consider addition of SGLT-2  - Patient denies personal or family history of multiple endocrine neoplasia type 2, medullary thyroid cancer; personal history of pancreatitis or gallbladder disease. - Recommend to check glucose continuously with Dexcom G7 CGM   Follow Up Plan: PCP on 01/17/2024 and PharmD on 01/28/2024  Jarrett Ables, PharmD PGY-1 Pharmacy Resident  Kieth Brightly, PharmD, BCACP, CPP Clinical Pharmacist, [facility]

## 2023-12-27 ENCOUNTER — Telehealth: Admitting: Family Medicine

## 2023-12-27 DIAGNOSIS — L02415 Cutaneous abscess of right lower limb: Secondary | ICD-10-CM

## 2023-12-27 MED ORDER — CEPHALEXIN 500 MG PO CAPS: 500.0000 mg | ORAL_CAPSULE | Freq: Three times a day (TID) | ORAL | 0 refills | Status: AC

## 2023-12-27 NOTE — Patient Instructions (Signed)
 Skin Abscess  A skin abscess is an infected area on or under your skin. It contains pus and other material. An abscess may also be called a furuncle, carbuncle, or boil. It is often the result of an infection caused by bacteria. An abscess can occur in or on almost any part of your body. Sometimes, an abscess may break open (rupture) on its own. In most cases, it will keep getting worse unless it is treated. An abscess can cause pain and make you feel ill. An untreated abscess can cause infection to spread to other parts of your body or your bloodstream. The abscess may need to be drained. You may also need to take antibiotics. What are the causes? An abscess occurs when germs, like bacteria, pass through your skin and cause an infection. This may be caused by: A scrape or cut on your skin. A puncture wound through your skin, such as a needle injection or insect bite. Blocked oil or sweat glands. Blocked and infected hair follicles. A fluid-filled sac that forms beneath your skin (sebaceous cyst) and becomes infected. What increases the risk? You may be more likely to develop an abscess if: You have problems with blood circulation, or you have a weak body defense system (immune system). You have diabetes. You have dry and irritated skin. You get injections often or use IV drugs. You have a foreign body in a wound, such as a splinter. You smoke or use tobacco products. What are the signs or symptoms? Symptoms of this condition include: A painful, firm bump under the skin. A bump with pus at the top. This may break through the skin and drain. Other symptoms include: Redness and swelling around the abscess. Warmth or tenderness. Swelling of the lymph nodes (glands) near the abscess. A sore on the skin. How is this diagnosed? This condition may be diagnosed based on a physical exam and your medical history. You may also have tests done, such as: A test of a sample of pus. This may be done  to find what is causing the infection. Blood tests. Imaging tests, such as an ultrasound, CT scan, or MRI. How is this treated? A small abscess that drains on its own may not need to be treated. Treatment for larger abscesses may include: Moist heat or a heat pack applied to the area a few times a day. Incision and drainage. This is a procedure to drain the abscess. Antibiotics. For a severe abscess, you may first get antibiotics through an IV and then change to antibiotics by mouth. Follow these instructions at home: Medicines Take over-the-counter and prescription medicines only as told by your provider. If you were prescribed antibiotics, take them as told by your provider. Do not stop using the antibiotic even if you start to feel better. Abscess care  If you have an abscess that has not drained, apply heat to the affected area. Use the heat source that your provider recommends, such as a moist heat pack or a heating pad. Place a towel between your skin and the heat source. Leave the heat on for 20-30 minutes at a time. If your skin turns bright red, remove the heat right away to prevent burns. The risk of burns is higher if you cannot feel pain, heat, or cold. Follow instructions from your provider about how to take care of your abscess. Make sure you: Cover the abscess with a bandage (dressing). Wash your hands with soap and water for at least 20 seconds before  and after you change the dressing or gauze. If soap and water are not available, use hand sanitizer. Change your dressing or gauze as told by your provider. Check your abscess every day for signs of an infection that is getting worse. Check for: More redness, swelling, pain, or tenderness. More fluid or blood. Warmth. More pus or a worse smell. General instructions To avoid spreading the infection: Do not share personal care items, towels, or hot tubs with others. Avoid making skin contact with other people. Be careful  when getting rid of used dressings, wound packing, or any drainage from the abscess. Do not use any products that contain nicotine or tobacco. These products include cigarettes, chewing tobacco, and vaping devices, such as e-cigarettes. If you need help quitting, ask your provider. Do not use any creams, ointments, or liquids unless you have been told to by your provider. Contact a health care provider if: You see redness that spreads quickly or red streaks on your skin spreading away from the abscess. You have any signs of worse infection at the abscess. You vomit every time you eat or drink. You have a fever, chills, or muscle aches. The cyst or abscess returns. Get help right away if: You have severe pain. You make less pee (urine) than normal. This information is not intended to replace advice given to you by your health care provider. Make sure you discuss any questions you have with your health care provider. Document Revised: 05/16/2022 Document Reviewed: 05/16/2022 Elsevier Patient Education  2024 [employer].

## 2023-12-27 NOTE — Progress Notes (Signed)
 Virtual Visit Consent   Derek Decker, you are scheduled for a virtual visit with a [facility] provider today. Just as with appointments in the office, your consent must be obtained to participate. Your consent will be active for this visit and any virtual visit you may have with one of our providers in the next 365 days. If you have a MyChart account, a copy of this consent can be sent to you electronically.  As this is a virtual visit, video technology does not allow for your provider to perform a traditional examination. This may limit your provider's ability to fully assess your condition. If your provider identifies any concerns that need to be evaluated in person or the need to arrange testing (such as labs, EKG, etc.), we will make arrangements to do so. Although advances in technology are sophisticated, we cannot ensure that it will always work on either your end or our end. If the connection with a video visit is poor, the visit may have to be switched to a telephone visit. With either a video or telephone visit, we are not always able to ensure that we have a secure connection.  By engaging in this virtual visit, you consent to the provision of healthcare and authorize for your insurance to be billed (if applicable) for the services provided during this visit. Depending on your insurance coverage, you may receive a charge related to this service.  I need to obtain your verbal consent now. Are you willing to proceed with your visit today? Derek Decker has provided verbal consent on 12/27/2023 for a virtual visit (video or telephone). Georgana Curio, FNP  Date: 12/27/2023 12:17 PM   Virtual Visit via Video Note   I, Georgana Curio, connected with  Derek Decker  ([id number], 1986-08-18) on 12/27/23 at 12:30 PM EDT by a video-enabled telemedicine application and verified that I am speaking with the correct person using two identifiers.  Location: Patient: Virtual Visit Location Patient:  Home Provider: Virtual Visit Location Provider: Home Office   I discussed the limitations of evaluation and management by telemedicine and the availability of in person appointments. The patient expressed understanding and agreed to proceed.    History of Present Illness: Derek Decker is a 38 y.o. who identifies as a male who was assigned male at birth, and is being seen today for abscess of right thigh, red and inflamed, not draining with a history of abscess. Marland Kitchen  HPI: HPI  Problems:  Patient Active Problem List   Diagnosis Date Noted   Mild intermittent asthma without complication 04/08/2023   Elevated LFTs 06/27/2020   Gastroesophageal reflux disease 06/27/2020   Hepatic steatosis 04/14/2020   Hyperlipidemia associated with type 2 diabetes mellitus (HCC) 04/13/2020   Hypertension associated with type 2 diabetes mellitus (HCC) 07/10/2019   Morbid obesity (HCC) 07/10/2019   Diabetes mellitus (HCC) 02/26/2019    Allergies: No Known Allergies Medications:  Current Outpatient Medications:    albuterol (VENTOLIN HFA) 108 (90 Base) MCG/ACT inhaler, TAKE 2 PUFFS BY MOUTH EVERY 6 HOURS AS NEEDED FOR WHEEZE OR SHORTNESS OF BREATH, Disp: 20.1 each, Rfl: 0   amLODipine (NORVASC) 5 MG tablet, TAKE 1 TABLET (5 MG TOTAL) BY MOUTH DAILY., Disp: 30 tablet, Rfl: 5   atorvastatin (LIPITOR) 20 MG tablet, Take 1 tablet (20 mg total) by mouth daily., Disp: 90 tablet, Rfl: 0   benzonatate (TESSALON) 100 MG capsule, Take 1 capsule (100 mg total) by mouth 3 (three) times daily as  needed for cough., Disp: 30 capsule, Rfl: 0   cetirizine (ZYRTEC) 10 MG tablet, Take 1 tablet (10 mg total) by mouth daily., Disp: 30 tablet, Rfl: 11   Continuous Glucose Sensor (DEXCOM G7 SENSOR) MISC, CHANGE SENSOR EVERY 10 DAYS OR AS DIRECTED, Disp: 9 each, Rfl: 3   Dulaglutide (TRULICITY) 1.5 MG/0.5ML SOAJ, Inject 1.5 mg into the skin once a week., Disp: 2 mL, Rfl: 2   glucose blood (ONETOUCH VERIO) test strip, USE 1 STRIP TO  CHECK GLUCOSE four times a day, Disp: 100 each, Rfl: 2   insulin degludec (TRESIBA FLEXTOUCH) 200 UNIT/ML FlexTouch Pen, Inject 16 Units into the skin at bedtime., Disp: 3 mL, Rfl: 2   Insulin Pen Needle (PEN NEEDLES) 32G X 6 MM MISC, Use to inject insulin daily, Disp: 100 each, Rfl: 3   lisinopril (ZESTRIL) 40 MG tablet, Take 1 tablet (40 mg total) by mouth daily., Disp: 90 tablet, Rfl: 0   metFORMIN (GLUCOPHAGE-XR) 750 MG 24 hr tablet, Take 2 tablets (1,500 mg total) by mouth daily with breakfast., Disp: 180 tablet, Rfl: 1   montelukast (SINGULAIR) 10 MG tablet, Take 1 tablet (10 mg total) by mouth at bedtime., Disp: 90 tablet, Rfl: 0   naproxen sodium (ANAPROX) 550 MG tablet, Take 1 tablet (550 mg total) by mouth 2 (two) times daily., Disp: 60 tablet, Rfl: 2   omeprazole (PRILOSEC) 20 MG capsule, TAKE 1 CAPSULE BY MOUTH EVERY DAY BEFORE BREAKFAST, Disp: 90 capsule, Rfl: 0   promethazine-dextromethorphan (PROMETHAZINE-DM) 6.25-15 MG/5ML syrup, Take 5 mLs by mouth 4 (four) times daily as needed for cough., Disp: 118 mL, Rfl: 0  Observations/Objective: Patient is well-developed, well-nourished in no acute distress.  Resting comfortably  at home.  Head is normocephalic, atraumatic.  No labored breathing.  Speech is clear and coherent with logical content.  Patient is alert and oriented at baseline.    Assessment and Plan: 1. Abscess of right thigh (Primary)  Warm compresses, urgent care for I &D if it worsens.   Follow Up Instructions: I discussed the assessment and treatment plan with the patient. The patient was provided an opportunity to ask questions and all were answered. The patient agreed with the plan and demonstrated an understanding of the instructions.  A copy of instructions were sent to the patient via MyChart unless otherwise noted below.     The patient was advised to call back or seek an in-person evaluation if the symptoms worsen or if the condition fails to improve as  anticipated.    Georgana Curio, FNP

## 2024-01-03 ENCOUNTER — Other Ambulatory Visit: Payer: Self-pay | Admitting: Family

## 2024-01-03 DIAGNOSIS — J4520 Mild intermittent asthma, uncomplicated: Secondary | ICD-10-CM

## 2024-01-04 ENCOUNTER — Other Ambulatory Visit: Payer: Self-pay | Admitting: Family

## 2024-01-04 DIAGNOSIS — E1169 Type 2 diabetes mellitus with other specified complication: Secondary | ICD-10-CM

## 2024-01-17 ENCOUNTER — Encounter: Payer: Self-pay | Admitting: Family

## 2024-01-17 ENCOUNTER — Ambulatory Visit (INDEPENDENT_AMBULATORY_CARE_PROVIDER_SITE_OTHER): Admitting: Family

## 2024-01-17 VITALS — BP 132/85 | HR 88 | Temp 97.6°F | Ht 79.0 in | Wt 330.0 lb

## 2024-01-17 DIAGNOSIS — J4520 Mild intermittent asthma, uncomplicated: Secondary | ICD-10-CM

## 2024-01-17 DIAGNOSIS — E1169 Type 2 diabetes mellitus with other specified complication: Secondary | ICD-10-CM | POA: Diagnosis not present

## 2024-01-17 DIAGNOSIS — Z794 Long term (current) use of insulin: Secondary | ICD-10-CM | POA: Diagnosis not present

## 2024-01-17 DIAGNOSIS — K219 Gastro-esophageal reflux disease without esophagitis: Secondary | ICD-10-CM | POA: Diagnosis not present

## 2024-01-17 DIAGNOSIS — E1159 Type 2 diabetes mellitus with other circulatory complications: Secondary | ICD-10-CM

## 2024-01-17 DIAGNOSIS — I152 Hypertension secondary to endocrine disorders: Secondary | ICD-10-CM

## 2024-01-17 DIAGNOSIS — E785 Hyperlipidemia, unspecified: Secondary | ICD-10-CM

## 2024-01-17 LAB — CMP14+EGFR
ALT: 52 IU/L — ABNORMAL HIGH (ref 0–44)
AST: 36 IU/L (ref 0–40)
Albumin: 4.4 g/dL (ref 4.1–5.1)
Alkaline Phosphatase: 72 IU/L (ref 44–121)
BUN/Creatinine Ratio: 13 (ref 9–20)
BUN: 12 mg/dL (ref 6–20)
Bilirubin Total: 1.4 mg/dL — ABNORMAL HIGH (ref 0.0–1.2)
CO2: 21 mmol/L (ref 20–29)
Calcium: 9.1 mg/dL (ref 8.7–10.2)
Chloride: 103 mmol/L (ref 96–106)
Creatinine, Ser: 0.91 mg/dL (ref 0.76–1.27)
Globulin, Total: 2.2 g/dL (ref 1.5–4.5)
Glucose: 248 mg/dL — ABNORMAL HIGH (ref 70–99)
Potassium: 4.6 mmol/L (ref 3.5–5.2)
Sodium: 141 mmol/L (ref 134–144)
Total Protein: 6.6 g/dL (ref 6.0–8.5)
eGFR: 111 mL/min/{1.73_m2} (ref >59)

## 2024-01-17 LAB — BAYER DCA HB A1C WAIVED: HB A1C (BAYER DCA - WAIVED): 10.6 % — ABNORMAL HIGH (ref 4.8–5.6)

## 2024-01-17 MED ORDER — TRULICITY 3 MG/0.5ML ~~LOC~~ SOAJ: 3.0000 mg | SUBCUTANEOUS | 3 refills | Status: DC

## 2024-01-17 NOTE — Progress Notes (Signed)
 Subjective:    Patient ID: Derek Decker, male    DOB: 1986/06/29, 38 y.o.   MRN: [id number]  Chief Complaint  Patient presents with   Diabetes   Pt presents to the office today for chronic follow up.    He is followed by GI for fatty liver and followed by dietitian as needed.  He is morbid obese with a BMI of 37 and DM and HTN.    He has been Truilicity 3 mg at this time. His insurance would not cover Ozempic or Mounjaro.  He has tried Ozempic and Trulicity in the past. His A1C today is elevated. We will switch to [facility] and get approved from insurance.      01/17/2024    8:52 AM 10/10/2023    8:08 AM 07/11/2023   11:01 AM  Last 3 Weights  Weight (lbs) 330 lb 320 lb 334 lb  Weight (kg) 149.687 kg 145.151 kg 151.501 kg     Hypertension This is a chronic problem. The current episode started more than 1 year ago. The problem has been resolved since onset. The problem is controlled. Pertinent negatives include no blurred vision, malaise/fatigue, peripheral edema or shortness of breath. Risk factors for coronary artery disease include dyslipidemia, diabetes mellitus, obesity and male gender. The current treatment provides moderate improvement.  Diabetes He presents for his follow-up diabetic visit. He has type 2 diabetes mellitus. Pertinent negatives for diabetes include no blurred vision and no foot paresthesias. Risk factors for coronary artery disease include dyslipidemia, diabetes mellitus, hypertension, male sex and sedentary lifestyle. He is following a generally unhealthy diet. His overall blood glucose range is >200 mg/dl.  Asthma There is no cough, shortness of breath or wheezing. This is a chronic problem. The problem occurs intermittently. The problem has been waxing and waning. Associated symptoms include heartburn. Pertinent negatives include no malaise/fatigue. His past medical history is significant for asthma.  Gastroesophageal Reflux He complains of belching and  heartburn. He reports no coughing or no wheezing. This is a chronic problem. The current episode started more than 1 year ago. The problem occurs rarely. Risk factors include obesity. He has tried a PPI for the symptoms. The treatment provided moderate relief.  Hyperlipidemia This is a chronic problem. The current episode started more than 1 year ago. The problem is controlled. Recent lipid tests were reviewed and are normal. Exacerbating diseases include obesity. Pertinent negatives include no shortness of breath. Current antihyperlipidemic treatment includes statins. The current treatment provides moderate improvement of lipids. Risk factors for coronary artery disease include dyslipidemia, diabetes mellitus, male sex, hypertension and a sedentary lifestyle.      Review of Systems  Constitutional:  Negative for malaise/fatigue.  Eyes:  Negative for blurred vision.  Respiratory:  Negative for cough, shortness of breath and wheezing.   Gastrointestinal:  Positive for heartburn.  All other systems reviewed and are negative.      Objective:   Physical Exam Vitals reviewed.  Constitutional:      General: He is not in acute distress.    Appearance: He is well-developed. He is obese.  HENT:     Head: Normocephalic.     Right Ear: Tympanic membrane normal.     Left Ear: Tympanic membrane normal.  Eyes:     General:        Right eye: No discharge.        Left eye: No discharge.     Pupils: Pupils are equal, round, and reactive to  light.  Neck:     Thyroid: No thyromegaly.  Cardiovascular:     Rate and Rhythm: Normal rate and regular rhythm.     Heart sounds: Normal heart sounds. No murmur heard. Pulmonary:     Effort: Pulmonary effort is normal. No respiratory distress.     Breath sounds: Normal breath sounds. No wheezing.  Abdominal:     General: Bowel sounds are normal. There is no distension.     Palpations: Abdomen is soft.     Tenderness: There is no abdominal tenderness.   Musculoskeletal:        General: No tenderness. Normal range of motion.     Cervical back: Normal range of motion and neck supple.  Skin:    General: Skin is warm and dry.     Findings: No erythema or rash.  Neurological:     Mental Status: He is alert and oriented to person, place, and time.     Cranial Nerves: No cranial nerve deficit.     Deep Tendon Reflexes: Reflexes are normal and symmetric.  Psychiatric:        Behavior: Behavior normal.        Thought Content: Thought content normal.        Judgment: Judgment normal.     Diabetic Foot Exam - Simple   Simple Foot Form Diabetic Foot exam was performed with the following findings: Yes 01/17/2024  9:10 AM  Visual Inspection See comments: Yes Sensation Testing Intact to touch and monofilament testing bilaterally: Yes Pulse Check Posterior Tibialis and Dorsalis pulse intact bilaterally: Yes Comments Bilateral hammer toe of all 4 digits      BP 132/85   Pulse 88   Temp 97.6 F (36.4 C) (Temporal)   Ht 6\' 7"  (2.007 m)   Wt (!) 330 lb (149.7 kg)   PF 96 L/min   BMI 37.18 kg/m      Assessment & Plan:  Derek Decker comes in today with chief complaint of Diabetes   Diagnosis and orders addressed:  1. Type 2 diabetes mellitus with other specified complication, with long-term current use of insulin (HCC) (Primary) - Pneumococcal conjugate vaccine 20-valent (Prevnar 20) - Dulaglutide (TRULICITY) 3 MG/0.5ML SOAJ; Inject 3 mg as directed once a week.  Dispense: 3 mL; Refill: 3 - Bayer DCA Hb A1c Waived - CMP14+EGFR  2. Gastroesophageal reflux disease, unspecified whether esophagitis present - CMP14+EGFR  3. Morbid obesity (HCC)  - CMP14+EGFR  4. Mild intermittent asthma without complication - CMP14+EGFR  5. Hypertension associated with type 2 diabetes mellitus (HCC)  - CMP14+EGFR  6. Hyperlipidemia associated with type 2 diabetes mellitus (HCC - CMP14+EGFR    Labs pending Will increase Trulicity to 3  mg from 1.5 mg  Strict low carb  Health Maintenance reviewed Diet and exercise encouraged  Follow up plan: 2 months    Jannifer Rodney, FNP

## 2024-01-17 NOTE — Patient Instructions (Signed)

## 2024-01-28 ENCOUNTER — Ambulatory Visit (INDEPENDENT_AMBULATORY_CARE_PROVIDER_SITE_OTHER): Admitting: Pharmacist

## 2024-01-28 DIAGNOSIS — Z794 Long term (current) use of insulin: Secondary | ICD-10-CM

## 2024-01-28 DIAGNOSIS — E1169 Type 2 diabetes mellitus with other specified complication: Secondary | ICD-10-CM | POA: Diagnosis not present

## 2024-01-28 DIAGNOSIS — E119 Type 2 diabetes mellitus without complications: Secondary | ICD-10-CM

## 2024-01-28 DIAGNOSIS — I152 Hypertension secondary to endocrine disorders: Secondary | ICD-10-CM

## 2024-01-28 DIAGNOSIS — E1159 Type 2 diabetes mellitus with other circulatory complications: Secondary | ICD-10-CM

## 2024-01-28 MED ORDER — TRULICITY 4.5 MG/0.5ML ~~LOC~~ SOAJ: 4.5000 mg | SUBCUTANEOUS | Status: AC

## 2024-01-28 MED ORDER — TRESIBA FLEXTOUCH 200 UNIT/ML ~~LOC~~ SOPN: 20.0000 [IU] | PEN_INJECTOR | SUBCUTANEOUS | Status: DC

## 2024-01-28 MED ORDER — DEXCOM G7 SENSOR MISC: 6 refills | Status: AC

## 2024-01-28 NOTE — Progress Notes (Signed)
 01/28/2024 Name: Derek Decker MRN: [id number] DOB: April 22, 1986  Chief Complaint  Patient presents with   Diabetes    Derek Decker is Decker 38 y.o. year old male who was referred for medication management by their primary care provider, Derek Hem, FNP. They presented for Decker face to face visit today.   They were referred to the pharmacist by their PCP for assistance in managing diabetes   Patient was identified as falling into the [place] Measure - Diabetes.    Patient was: Referred to pharmacy for chronic disease management.    Subjective:  Care Team: Primary Care Provider: Yevette Hem, FNP   Medication Access/Adherence  Current Pharmacy:  [phone] - [place],  - [address] [place] [address] Phone: [phone] Fax: [phone]  [facility] - [place], [place] - [address] [place] [address] Phone: [phone] Fax: [phone]  Patient reports affordability concerns with their medications: No  Patient reports access/transportation concerns to their pharmacy: No  Patient reports adherence concerns with their medications:  No     Diabetes:  Current medications:  Trulicity 3mg  (Sundays), Derek Decker 16 units daily (PM), metformin XR 1500 mg daily Medications tried in the past: Ozempic (changed d/t insurance coverage), Mounjaro, invokana   Date of Download: 01/28/24 14 days % Time CGM is active: 78% Average Glucose: 304 mg/dL Glucose Management Indicator: 10.6  Glucose Variability: 64 (goal <36%) Time in Goal:  - Time in range 70-180: 2% - Time above range: 76% very high, 22% high% - Time below range: 0% Observed patterns:   Patient denies hypoglycemic s/sx including dizziness, shakiness, sweating.   Current meal patterns:  Working to improved healthy eating Discussed meal planning options and Plate method for healthy eating Avoid sugary drinks and desserts Incorporate  balanced protein, non starchy veggies, 1 serving of carbohydrate with each meal Increase water intake Increase physical activity as able - Breakfast: skips - Lunch: sandwich, salads - Supper: chicken, beef, veggies, trying to eat more salads - Snacks: does not snack - Drinks: drinking more water, cut back on soda  Current physical activity: active at work; encouraged as able  Current medication access support: [employer] commercial   Objective:  Lab Results  Component Value Date   HGBA1C 10.6 (H) 01/17/2024    Lab Results  Component Value Date   CREATININE 0.91 01/17/2024   BUN 12 01/17/2024   NA 141 01/17/2024   K 4.6 01/17/2024   CL 103 01/17/2024   CO2 21 01/17/2024    Lab Results  Component Value Date   CHOL 121 04/08/2023   HDL 50 04/08/2023   LDLCALC 54 04/08/2023   TRIG 89 04/08/2023   CHOLHDL 2.4 04/08/2023    Medications Reviewed Today     Reviewed by Derek Decker, [facility] (Pharmacist) on 01/28/24 at 1124  Med List Status: <None>   Medication Order Taking? Sig Documenting Provider Last Dose Status Informant  albuterol (VENTOLIN HFA) 108 (90 Base) MCG/ACT inhaler [id number] No TAKE 2 PUFFS BY MOUTH EVERY 6 HOURS AS NEEDED FOR WHEEZE OR SHORTNESS OF BREATH Decker, Derek A, FNP Taking Active   amLODipine (NORVASC) 5 MG tablet [id number] No TAKE 1 TABLET (5 MG TOTAL) BY MOUTH DAILY. Derek Hem, FNP Taking Active   atorvastatin (LIPITOR) 20 MG tablet [id number] No TAKE 1 TABLET BY MOUTH EVERY DAY Decker, Derek A, FNP Taking Active   cetirizine (ZYRTEC) 10 MG tablet  [id number] No Take 1 tablet (10 mg total) by mouth daily. Derek Hem, FNP Taking Active   Continuous Glucose Sensor (DEXCOM G7 SENSOR) MISC [id number] No CHANGE SENSOR EVERY 10 DAYS OR AS DIRECTED Derek Hem, FNP Taking Active    Patient taking differently:   Discontinued 01/28/24 1123   glucose blood (ONETOUCH VERIO) test strip [id number] No USE 1 STRIP TO CHECK GLUCOSE four times Decker day  Derek Hem, FNP Taking Active   Discontinued 01/28/24 1123   Insulin Pen Needle (PEN NEEDLES) 32G X 6 MM MISC [id number] No Use to inject insulin daily Derek Fragmin A, FNP Taking Active   lisinopril (ZESTRIL) 40 MG tablet [id number] No Take 1 tablet (40 mg total) by mouth daily. Derek Hem, FNP Taking Active   metFORMIN (GLUCOPHAGE-XR) 750 MG 24 hr tablet [id number] No Take 2 tablets (1,500 mg total) by mouth daily with breakfast. Derek Hem, FNP Taking Active   montelukast (SINGULAIR) 10 MG tablet [id number] No TAKE 1 TABLET BY MOUTH EVERYDAY AT BEDTIME Derek Fragmin A, FNP Taking Active   naproxen sodium (ANAPROX) 550 MG tablet [id number] No Take 1 tablet (550 mg total) by mouth 2 (two) times daily. Derek Hem, FNP Taking Active   omeprazole (PRILOSEC) 20 MG capsule [id number] No TAKE 1 CAPSULE BY MOUTH EVERY DAY BEFORE BREAKFAST Decker, Derek A, FNP Taking Active               Assessment/Plan:   Diabetes: - Currently uncontrolled, but improving (A1c has decreased from 14%-->10%) - Reviewed long term cardiovascular and renal outcomes of uncontrolled blood sugar - Reviewed goal A1c, goal fasting, and goal 2 hour post prandial glucose - Recommend to:  Increase to Trulicity 4.5mg  in 2 weeks after 3mg  completed Determine if able to get ozempic vs mounjaro Increase tresiba to 20 units daily  New med list updated and printed for patient - Patient denies personal or family history of multiple endocrine neoplasia type 2, medullary thyroid cancer; personal history of pancreatitis or gallbladder disease. - Recommend to check glucose using Dexcom g7--refills sent   Follow Up Plan: when PharmD determines insurance coverage; PCP in June    Marvell Slider, PharmD, [place], CPP Clinical Pharmacist, [facility]

## 2024-01-29 ENCOUNTER — Telehealth: Payer: Self-pay | Admitting: Pharmacist

## 2024-01-30 MED ORDER — CETIRIZINE HCL 10 MG PO TABS: 10.0000 mg | ORAL_TABLET | Freq: Every day | ORAL | 11 refills | Status: AC

## 2024-01-31 ENCOUNTER — Telehealth: Payer: Self-pay | Admitting: Pharmacy Technician

## 2024-01-31 ENCOUNTER — Other Ambulatory Visit: Payer: Self-pay | Admitting: Family

## 2024-01-31 DIAGNOSIS — E1159 Type 2 diabetes mellitus with other circulatory complications: Secondary | ICD-10-CM

## 2024-01-31 NOTE — Telephone Encounter (Signed)
 Pharmacy Patient Advocate Encounter  Received notification from [employer] that Prior Authorization for ozempic  has been APPROVED from 01/29/24 to 01/28/25   PA #/Case ID/Reference #: [id number]

## 2024-02-17 ENCOUNTER — Telehealth: Payer: Self-pay

## 2024-02-17 NOTE — Progress Notes (Signed)
 Complex Care Management Care Guide Note  02/17/2024 Name: Derek Decker MRN: [id number] DOB: 1986/07/30  Derek Decker is a 38 y.o. year old male who is a primary care patient of Yevette Hem, FNP and is actively engaged with the care management team. I reached out to Omer Bicker by phone today to assist with re-scheduling  with the Pharmacist.  Follow up plan: Unsuccessful telephone outreach attempt made. A HIPAA compliant phone message was left for the patient providing contact information and requesting a return call.  Lenton Rail , RMA     [facility]  [facility] Guide  Direct Dial: [phone]  Website: [email or web address]

## 2024-02-19 ENCOUNTER — Other Ambulatory Visit

## 2024-02-26 ENCOUNTER — Other Ambulatory Visit (INDEPENDENT_AMBULATORY_CARE_PROVIDER_SITE_OTHER): Admitting: Pharmacist

## 2024-02-26 DIAGNOSIS — E119 Type 2 diabetes mellitus without complications: Secondary | ICD-10-CM

## 2024-02-26 DIAGNOSIS — Z794 Long term (current) use of insulin: Secondary | ICD-10-CM

## 2024-02-26 NOTE — Progress Notes (Signed)
 02/26/2024 Name: Derek Decker MRN: [id number] DOB: 1986/09/16  Chief Complaint  Patient presents with   Diabetes    Derek Decker is a 38 y.o. year old male who presented for a telephone visit.   They were referred to the pharmacist by their PCP for assistance in managing diabetes and medication access.   Patient was identified as falling into the [place] Measure - Diabetes.   Patient was: Referred to pharmacy for chronic disease management.  Referred to Diabetes Management.   Subjective: Patient reports he is doing well.  He just took his last dose of trulicity  on Sunday.  He is ready to transition back to Ozempic  as now insurance has approved it.  Care Team: Primary Care Provider: Yevette Hem, FNP   Medication Access/Adherence  Current Pharmacy:  [facility] [phone] - [place] - [address] [place] [address] Phone: [phone] Fax: [phone]  [facility] - [place], [place] - [address] [place] [address] Phone: [phone] Fax: [phone]   Patient reports affordability concerns with their medications: No  Patient reports access/transportation concerns to their pharmacy: No  Patient reports adherence concerns with their medications:  No    Diabetes:  Current medications:  Trulicity  4.5mg  (Sundays), Tresiba  20 units daily (PM), metformin  XR 1500 mg daily Medications tried in the past: Ozempic  (changed d/t insurance coverage), Mounjaro , invokana     Using dexcom G7 CGM  Date of Download: 02/26/24   % Time CGM is active: 82% Average Glucose: 193 mg/dL Glucose Management Indicator: 7.9  Glucose Variability: 20.4 (goal <36%) Time in Goal:  - Time in range 70-180: 42% - Time above range: 58% - Time below range: 0% Observed patterns: still elevated throughout the day but much improved  Patient denies hypoglycemic s/sx including dizziness, shakiness, sweating. Patient denies  hyperglycemic symptoms including polyuria, polydipsia, polyphagia, nocturia, neuropathy, blurred vision.  Current meal patterns:  - Breakfast: skips - Lunch: sandwich, salads - Supper: chicken, beef, veggies, trying to eat more salads - Snacks: does not snack - Drinks: drinking more water, cut back on soda  Current physical activity: increase as able   Objective:  Lab Results  Component Value Date   HGBA1C 10.6 (H) 01/17/2024    Lab Results  Component Value Date   CREATININE 0.91 01/17/2024   BUN 12 01/17/2024   NA 141 01/17/2024   K 4.6 01/17/2024   CL 103 01/17/2024   CO2 21 01/17/2024    Lab Results  Component Value Date   CHOL 121 04/08/2023   HDL 50 04/08/2023   LDLCALC 54 04/08/2023   TRIG 89 04/08/2023   CHOLHDL 2.4 04/08/2023    Medications Reviewed Today     Reviewed by Delilah Fend, [facility] (Pharmacist) on 02/26/24 at 1326  Med List Status: <None>   Medication Order Taking? Sig Documenting Provider Last Dose Status Informant  albuterol  (VENTOLIN  HFA) 108 (90 Base) MCG/ACT inhaler [id number]  TAKE 2 PUFFS BY MOUTH EVERY 6 HOURS AS NEEDED FOR WHEEZE OR SHORTNESS OF BREATH Hawks, Christy A, FNP  Active   amLODipine  (NORVASC ) 5 MG tablet [id number]  TAKE 1 TABLET (5 MG TOTAL) BY MOUTH DAILY. Tommas Fragmin A, FNP  Active   atorvastatin  (LIPITOR) 20 MG tablet [id number]  TAKE 1 TABLET BY MOUTH EVERY DAY Hawks, Christy A, FNP  Active   cetirizine  (ZYRTEC ) 10 MG tablet [id number]  Take 1 tablet (10 mg total) by mouth daily.  Yevette Hem, FNP  Active   Continuous Glucose Sensor (DEXCOM G7 SENSOR) [place] [id number]  CHANGE SENSOR EVERY 10 DAYS AS DIRECTED Yevette Hem, FNP  Active    Patient taking differently:   Discontinued 01/28/24 1123   glucose blood (ONETOUCH VERIO) test strip [id number]  USE 1 STRIP TO CHECK GLUCOSE four times a day Tommas Fragmin A, FNP  Active   insulin degludec  (TRESIBA  FLEXTOUCH) 200 UNIT/ML FlexTouch Pen [id number]  Inject 20 Units into  the skin daily.  Patient taking differently: Inject 22 Units into the skin daily.   Tommas Fragmin A, FNP  Active   Insulin Pen Needle (PEN NEEDLES) 32G X 6 MM MISC [id number]  Use to inject insulin daily Yevette Hem, [place]  Active   lisinopril  (ZESTRIL ) 40 MG tablet [id number]  TAKE 1 TABLET BY MOUTH EVERY DAY Hawks, Christy A, FNP  Active   metFORMIN  (GLUCOPHAGE -XR) 750 MG 24 hr tablet [id number]  Take 2 tablets (1,500 mg total) by mouth daily with breakfast. Tommas Fragmin A, FNP  Active   montelukast  (SINGULAIR ) 10 MG tablet [id number]  TAKE 1 TABLET BY MOUTH EVERYDAY AT BEDTIME Tommas Fragmin A, FNP  Active   naproxen  sodium (ANAPROX ) 550 MG tablet [id number]  Take 1 tablet (550 mg total) by mouth 2 (two) times daily. Tommas Fragmin A, FNP  Active   omeprazole  (PRILOSEC) 20 MG capsule [id number]  TAKE 1 CAPSULE BY MOUTH EVERY DAY BEFORE BREAKFAST Hawks, Christy A, FNP  Active   Semaglutide ,0.25 or 0.5MG /DOS, (OZEMPIC , 0.25 OR 0.5 MG/DOSE,) 2 MG/3ML SOPN [id number] Yes Inject 0.5 mg into the skin once a week. [provider]  Active              Assessment/Plan:   Diabetes: - Currently uncontrolled, but improving. - Reviewed long term cardiovascular and renal outcomes of uncontrolled blood sugar - Reviewed goal A1c, goal fasting, and goal 2 hour post prandial glucose - Recommend to : Transition to Ozempic  0.5mg  weekly for 4 weeks then increase to 1mg  weekly--titrate to 2mg  weekly as able; will leave sample up front.  Increase tresiba  to 22 units daily   - Patient denies personal or family history of multiple endocrine neoplasia type 2, medullary thyroid  cancer; personal history of pancreatitis or gallbladder disease.  Follow Up Plan: 1 month  Marvell Slider, PharmD, BCACP, CPP Clinical Pharmacist, [facility]

## 2024-02-27 ENCOUNTER — Encounter: Payer: Self-pay | Admitting: Pharmacist

## 2024-03-17 ENCOUNTER — Other Ambulatory Visit: Payer: Self-pay | Admitting: Family

## 2024-03-17 DIAGNOSIS — E1169 Type 2 diabetes mellitus with other specified complication: Secondary | ICD-10-CM

## 2024-03-19 DIAGNOSIS — Z7984 Long term (current) use of oral hypoglycemic drugs: Secondary | ICD-10-CM | POA: Diagnosis not present

## 2024-03-19 DIAGNOSIS — W1830XA Fall on same level, unspecified, initial encounter: Secondary | ICD-10-CM | POA: Diagnosis not present

## 2024-03-19 DIAGNOSIS — Z79899 Other long term (current) drug therapy: Secondary | ICD-10-CM | POA: Diagnosis not present

## 2024-03-19 DIAGNOSIS — S60949A Unspecified superficial injury of unspecified finger, initial encounter: Secondary | ICD-10-CM | POA: Diagnosis not present

## 2024-03-19 DIAGNOSIS — E119 Type 2 diabetes mellitus without complications: Secondary | ICD-10-CM | POA: Diagnosis not present

## 2024-03-19 DIAGNOSIS — Z87891 Personal history of nicotine dependence: Secondary | ICD-10-CM | POA: Diagnosis not present

## 2024-03-19 DIAGNOSIS — I10 Essential (primary) hypertension: Secondary | ICD-10-CM | POA: Diagnosis not present

## 2024-03-19 DIAGNOSIS — W19XXXA Unspecified fall, initial encounter: Secondary | ICD-10-CM | POA: Diagnosis not present

## 2024-03-19 DIAGNOSIS — S6992XA Unspecified injury of left wrist, hand and finger(s), initial encounter: Secondary | ICD-10-CM | POA: Diagnosis not present

## 2024-03-19 DIAGNOSIS — M79645 Pain in left finger(s): Secondary | ICD-10-CM | POA: Diagnosis not present

## 2024-03-26 ENCOUNTER — Other Ambulatory Visit: Payer: Self-pay | Admitting: Family

## 2024-03-26 ENCOUNTER — Ambulatory Visit (INDEPENDENT_AMBULATORY_CARE_PROVIDER_SITE_OTHER): Admitting: Family

## 2024-03-26 ENCOUNTER — Encounter: Payer: Self-pay | Admitting: Family

## 2024-03-26 VITALS — BP 124/82 | HR 89 | Temp 97.8°F | Ht 79.0 in | Wt 324.0 lb

## 2024-03-26 DIAGNOSIS — Z6836 Body mass index (BMI) 36.0-36.9, adult: Secondary | ICD-10-CM

## 2024-03-26 DIAGNOSIS — K760 Fatty (change of) liver, not elsewhere classified: Secondary | ICD-10-CM | POA: Diagnosis not present

## 2024-03-26 DIAGNOSIS — E785 Hyperlipidemia, unspecified: Secondary | ICD-10-CM | POA: Diagnosis not present

## 2024-03-26 DIAGNOSIS — Z0000 Encounter for general adult medical examination without abnormal findings: Secondary | ICD-10-CM

## 2024-03-26 DIAGNOSIS — Z0001 Encounter for general adult medical examination with abnormal findings: Secondary | ICD-10-CM

## 2024-03-26 DIAGNOSIS — E1169 Type 2 diabetes mellitus with other specified complication: Secondary | ICD-10-CM

## 2024-03-26 DIAGNOSIS — I152 Hypertension secondary to endocrine disorders: Secondary | ICD-10-CM

## 2024-03-26 DIAGNOSIS — K219 Gastro-esophageal reflux disease without esophagitis: Secondary | ICD-10-CM

## 2024-03-26 DIAGNOSIS — J4520 Mild intermittent asthma, uncomplicated: Secondary | ICD-10-CM

## 2024-03-26 DIAGNOSIS — E1159 Type 2 diabetes mellitus with other circulatory complications: Secondary | ICD-10-CM | POA: Diagnosis not present

## 2024-03-26 LAB — BAYER DCA HB A1C WAIVED: HB A1C (BAYER DCA - WAIVED): 7.6 % — ABNORMAL HIGH (ref 4.8–5.6)

## 2024-03-26 MED ORDER — OMEPRAZOLE 20 MG PO CPDR: DELAYED_RELEASE_CAPSULE | ORAL | 0 refills | Status: DC

## 2024-03-26 MED ORDER — ATORVASTATIN CALCIUM 20 MG PO TABS
20.0000 mg | ORAL_TABLET | Freq: Every day | ORAL | 0 refills | Status: DC
Start: 2024-03-26 — End: 2024-04-15

## 2024-03-26 MED ORDER — SEMAGLUTIDE (1 MG/DOSE) 4 MG/3ML ~~LOC~~ SOPN: 1.0000 mg | PEN_INJECTOR | SUBCUTANEOUS | 2 refills | Status: DC

## 2024-03-26 NOTE — Telephone Encounter (Signed)
  TRULICITY  0.75 MG/0.5ML SOAJ        Changed from: Semaglutide , 1 MG/DOSE, 4 MG/3ML SOPN   Pharmacy comment: Alternative Requested:MEDICINE IS NOT ON FORMULARY.   All Pharmacy Suggested Alternatives:  Dulaglutide  (TRULICITY ) 0.75 MG/0.5ML SOAJ liraglutide (VICTOZA) 18 MG/3ML SOPN

## 2024-03-26 NOTE — Progress Notes (Signed)
 Subjective:    Patient ID: Derek Decker, male    DOB: Mar 16, 1986, 38 y.o.   MRN: [id number]  Chief Complaint  Patient presents with   Annual Exam   Pt presents to the office today for chronic follow up.    He is followed by GI for fatty liver and followed by dietitian as needed.  He is morbid obese with a BMI of 36 and DM and HTN.    He is taking Ozempic  0.5 mg. His starting weight was 334 lb.      03/26/2024   11:26 AM 01/17/2024    8:52 AM 10/10/2023    8:08 AM  Last 3 Weights  Weight (lbs) 324 lb 330 lb 320 lb  Weight (kg) 146.965 kg 149.687 kg 145.151 kg     Hypertension This is a chronic problem. The current episode started more than 1 year ago. The problem has been resolved since onset. The problem is controlled. Pertinent negatives include no blurred vision, malaise/fatigue, peripheral edema or shortness of breath. Risk factors for coronary artery disease include dyslipidemia, diabetes mellitus, obesity and male gender. The current treatment provides moderate improvement.  Diabetes He presents for his follow-up diabetic visit. He has type 2 diabetes mellitus. Pertinent negatives for diabetes include no blurred vision and no foot paresthesias. Risk factors for coronary artery disease include dyslipidemia, diabetes mellitus, hypertension, male sex and sedentary lifestyle. He is following a generally unhealthy diet. His overall blood glucose range is 130-140 mg/dl.  Asthma There is no cough, shortness of breath or wheezing. This is a chronic problem. The problem occurs intermittently. The problem has been waxing and waning. Associated symptoms include heartburn. Pertinent negatives include no malaise/fatigue. His past medical history is significant for asthma.  Gastroesophageal Reflux He complains of belching and heartburn. He reports no coughing or no wheezing. This is a chronic problem. The current episode started more than 1 year ago. The problem occurs rarely. The symptoms are  aggravated by certain foods. Risk factors include obesity. He has tried a PPI for the symptoms. The treatment provided moderate relief.  Hyperlipidemia This is a chronic problem. The current episode started more than 1 year ago. The problem is controlled. Recent lipid tests were reviewed and are normal. Exacerbating diseases include obesity. Pertinent negatives include no shortness of breath. Current antihyperlipidemic treatment includes statins. The current treatment provides moderate improvement of lipids. Risk factors for coronary artery disease include dyslipidemia, diabetes mellitus, male sex, hypertension and a sedentary lifestyle.      Review of Systems  Constitutional:  Negative for malaise/fatigue.  Eyes:  Negative for blurred vision.  Respiratory:  Negative for cough, shortness of breath and wheezing.   Gastrointestinal:  Positive for heartburn.  All other systems reviewed and are negative.  Family History  Problem Relation Age of Onset   Hypertension Mother    Hypertension Father    Liver disease Neg Hx    Colon cancer Neg Hx    Social History   Socioeconomic History   Marital status: Single    Spouse name: Not on file   Number of children: Not on file   Years of education: Not on file   Highest education level: 12th grade  Occupational History   Not on file  Tobacco Use   Smoking status: Former    Current packs/day: 0.25    Average packs/day: 0.3 packs/day for 3.0 years (0.8 ttl pk-yrs)    Types: Cigarettes   Smokeless tobacco: Never  Vaping Use  Vaping status: Never Used  Substance and Sexual Activity   Alcohol use: Not Currently   Drug use: No   Sexual activity: Not on file  Other Topics Concern   Not on file  Social History Narrative   Not on file   Social Drivers of Health   Financial Resource Strain: Low Risk  (01/13/2024)   Overall Financial Resource Strain (CARDIA)    Difficulty of Paying Living Expenses: Not hard at all  Food Insecurity: No  Food Insecurity (01/13/2024)   Hunger Vital Sign    Worried About Running Out of Food in the Last Year: Never true    Ran Out of Food in the Last Year: Never true  Transportation Needs: No Transportation Needs (01/13/2024)   PRAPARE - Administrator, Civil Service (Medical): No    Lack of Transportation (Non-Medical): No  Physical Activity: Sufficiently Active (01/13/2024)   Exercise Vital Sign    Days of Exercise per Week: 7 days    Minutes of Exercise per Session: 50 min  Stress: No Stress Concern Present (01/13/2024)   [employer] of Occupational Health - Occupational Stress Questionnaire    Feeling of Stress : Only a little  Social Connections: Unknown (01/13/2024)   Social Connection and Isolation Panel    Frequency of Communication with Friends and Family: More than three times a week    Frequency of Social Gatherings with Friends and Family: Three times a week    Attends Religious Services: Patient declined    Active Member of Clubs or Organizations: No    Attends Banker Meetings: Not on file    Marital Status: Never married       Objective:   Physical Exam Vitals reviewed.  Constitutional:      General: He is not in acute distress.    Appearance: He is well-developed. He is obese.  HENT:     Head: Normocephalic.     Right Ear: Tympanic membrane normal.     Left Ear: Tympanic membrane normal.   Eyes:     General:        Right eye: No discharge.        Left eye: No discharge.     Pupils: Pupils are equal, round, and reactive to light.   Neck:     Thyroid : No thyromegaly.   Cardiovascular:     Rate and Rhythm: Normal rate and regular rhythm.     Heart sounds: Normal heart sounds. No murmur heard. Pulmonary:     Effort: Pulmonary effort is normal. No respiratory distress.     Breath sounds: Normal breath sounds. No wheezing.  Abdominal:     General: Bowel sounds are normal. There is no distension.     Palpations: Abdomen is soft.      Tenderness: There is no abdominal tenderness.   Musculoskeletal:        General: No tenderness. Normal range of motion.     Cervical back: Normal range of motion and neck supple.   Skin:    General: Skin is warm and dry.     Findings: No erythema or rash.   Neurological:     Mental Status: He is alert and oriented to person, place, and time.     Cranial Nerves: No cranial nerve deficit.     Deep Tendon Reflexes: Reflexes are normal and symmetric.   Psychiatric:        Behavior: Behavior normal.        Thought  Content: Thought content normal.        Judgment: Judgment normal.       BP 124/82   Pulse 89   Temp 97.8 F (36.6 C) (Temporal)   Ht 6' 7 (2.007 m)   Wt (!) 324 lb (147 kg)   BMI 36.50 kg/m      Assessment & Plan:  CHASTIN RIESGO comes in today with chief complaint of Annual Exam   Diagnosis and orders addressed:  1. Hyperlipidemia associated with type 2 diabetes mellitus (HCC) - CBC with Differential/Platelet - Lipid panel - CMP14+EGFR - atorvastatin  (LIPITOR) 20 MG tablet; Take 1 tablet (20 mg total) by mouth daily.  Dispense: 90 tablet; Refill: 0  2. Gastroesophageal reflux disease, unspecified whether esophagitis present - CBC with Differential/Platelet - CMP14+EGFR - omeprazole  (PRILOSEC) 20 MG capsule; TAKE 1 CAPSULE BY MOUTH EVERY DAY BEFORE BREAKFAST  Dispense: 90 capsule; Refill: 0  3. Type 2 diabetes mellitus with other specified complication, without long-term current use of insulin (HCC) -Will increase Ozempic  to 1 mg from 0.5 mg  - Bayer DCA Hb A1c Waived - CBC with Differential/Platelet - CMP14+EGFR - Vitamin B12 - Semaglutide , 1 MG/DOSE, 4 MG/3ML SOPN; Inject 1 mg as directed once a week.  Dispense: 3 mL; Refill: 2  4. Hypertension associated with type 2 diabetes mellitus (HCC) - CBC with Differential/Platelet - CMP14+EGFR  5. Morbid obesity (HCC) - CBC with Differential/Platelet - CMP14+EGFR  6. Mild intermittent asthma without  complication - CBC with Differential/Platelet - CMP14+EGFR  7. Hepatic steatosis  - CBC with Differential/Platelet - CMP14+EGFR  8. Annual physical exam (Primary) - Bayer DCA Hb A1c Waived - CBC with Differential/Platelet - Lipid panel - CMP14+EGFR - Vitamin B12    Labs pending Will Ozempic  to 1 mg from 0.5 mg Strict low carb  Health Maintenance reviewed Diet and exercise encouraged  Follow up plan: 2 months    Tommas Fragmin, FNP

## 2024-03-26 NOTE — Telephone Encounter (Signed)
  TRULICITY  0.75 MG/0.5ML SOAJ        Changed from: Semaglutide , 1 MG/DOSE, 4 MG/3ML SOPN    Pharmacy comment: Alternative Requested:NOT COVERED.   All Pharmacy Suggested Alternatives:  Dulaglutide  (TRULICITY ) 0.75 MG/0.5ML SOAJ liraglutide (VICTOZA) 18 MG/3ML SOPN

## 2024-03-26 NOTE — Patient Instructions (Signed)

## 2024-03-27 ENCOUNTER — Ambulatory Visit: Payer: Self-pay | Admitting: Family

## 2024-03-27 LAB — LIPID PANEL
Chol/HDL Ratio: 2.5 ratio (ref 0.0–5.0)
Cholesterol, Total: 100 mg/dL (ref 100–199)
HDL: 40 mg/dL (ref >39)
LDL Chol Calc (NIH): 44 mg/dL (ref 0–99)
Triglycerides: 79 mg/dL (ref 0–149)
VLDL Cholesterol Cal: 16 mg/dL (ref 5–40)

## 2024-03-27 LAB — CBC WITH DIFFERENTIAL/PLATELET
Basophils Absolute: 0.1 10*3/uL (ref 0.0–0.2)
Basos: 1 %
EOS (ABSOLUTE): 0.3 10*3/uL (ref 0.0–0.4)
Eos: 5 %
Hematocrit: 47.8 % (ref 37.5–51.0)
Hemoglobin: 14.9 g/dL (ref 13.0–17.7)
Immature Grans (Abs): 0 10*3/uL (ref 0.0–0.1)
Immature Granulocytes: 0 %
Lymphocytes Absolute: 2.1 10*3/uL (ref 0.7–3.1)
Lymphs: 36 %
MCH: 29.4 pg (ref 26.6–33.0)
MCHC: 31.2 g/dL — ABNORMAL LOW (ref 31.5–35.7)
MCV: 95 fL (ref 79–97)
Monocytes Absolute: 0.3 10*3/uL (ref 0.1–0.9)
Monocytes: 6 %
Neutrophils Absolute: 3 10*3/uL (ref 1.4–7.0)
Neutrophils: 52 %
Platelets: 253 10*3/uL (ref 150–450)
RBC: 5.06 x10E6/uL (ref 4.14–5.80)
RDW: 12 % (ref 11.6–15.4)
WBC: 5.8 10*3/uL (ref 3.4–10.8)

## 2024-03-27 LAB — CMP14+EGFR
ALT: 45 IU/L — ABNORMAL HIGH (ref 0–44)
AST: 38 IU/L (ref 0–40)
Albumin: 4.4 g/dL (ref 4.1–5.1)
Alkaline Phosphatase: 66 IU/L (ref 44–121)
BUN/Creatinine Ratio: 9 (ref 9–20)
BUN: 8 mg/dL (ref 6–20)
Bilirubin Total: 1.6 mg/dL — ABNORMAL HIGH (ref 0.0–1.2)
CO2: 20 mmol/L (ref 20–29)
Calcium: 9.2 mg/dL (ref 8.7–10.2)
Chloride: 104 mmol/L (ref 96–106)
Creatinine, Ser: 0.87 mg/dL (ref 0.76–1.27)
Globulin, Total: 2.2 g/dL (ref 1.5–4.5)
Glucose: 169 mg/dL — ABNORMAL HIGH (ref 70–99)
Potassium: 4.7 mmol/L (ref 3.5–5.2)
Sodium: 140 mmol/L (ref 134–144)
Total Protein: 6.6 g/dL (ref 6.0–8.5)
eGFR: 113 mL/min/{1.73_m2} (ref >59)

## 2024-03-27 LAB — VITAMIN B12: Vitamin B-12: 845 pg/mL (ref 232–1245)

## 2024-03-30 ENCOUNTER — Telehealth: Payer: Self-pay | Admitting: Pharmacy Technician

## 2024-03-30 NOTE — Progress Notes (Signed)
   03/30/2024 Name: Derek Decker MRN: [id number] DOB: 20-Mar-1986  Patient is appearing on a report for [facility] [place] Metric Diabetes and last engaged with the clinical pharmacist to discuss diabetes on 02/26/2024. Contacted patient today to discuss diabetes management and completed medication review.   Diabetes Plan from last clinical pharmacist appointment:  Diabetes: - Currently uncontrolled, but improving. - Reviewed long term cardiovascular and renal outcomes of uncontrolled blood sugar - Reviewed goal A1c, goal fasting, and goal 2 hour post prandial glucose - Recommend to : Transition to Ozempic  0.5mg  weekly for 4 weeks then increase to 1mg  weekly--titrate to 2mg  weekly as able; will leave sample up front.  Increase tresiba  to 22 units daily   - Patient denies personal or family history of multiple endocrine neoplasia type 2, medullary thyroid  cancer; personal history of pancreatitis or gallbladder disease.  Follow Up Plan: 1 month  Medication Adherence Barriers Identified:  Patient made recommended medication changes per plan: Yes Patent informs he picked up the sample of Ozempic  0.5mg  and has one dose left for this week. He informs he has not picked up the Ozempic  1mg  dose from the pharmacy as he has not received notification from them that it is ready for pick up. Advised patient to call the pharmacy to check on the status of the medication. If pharmacy does not have the medication, then advised patient to call the office and request it be sent to [facility] in [place]. Patient informs he also has Tresiba  and Metformin   XR on hand. He informs he injects 22 units daily for the Tresiba  and takes 2x 750mg XR Metformin  per day.. Per Dr Anson Basta, 30 days supply of Metformin  last sold to patient on 03/20/24, Tresiba  last sold on 2/28/225 (patient informs he has this on hand). Ozempic  was sent to pharmacy on 03/26/24 (does not yet show up in Dr Anson Basta). Patient denies any cost concerns with these  medications. Access issues with any new medication or testing device: No Patient informs he has sensors on hand. Per Dr Anson Basta, sensors last sold to patient on 03/28/24 for 30 days supply.  Patient is checking blood sugars as prescribed: Yes Patient uses CGM to check his blood sugars. He informs his blood sugars have been ranging in the 200s with one high up to 300. He informs he has not had any low sugars.  Medication Adherence Barriers Addressed/Actions Taken:  Reviewed medication changes per plan from last clinical pharmacist note Medication Access for Ozempic  Will discuss medication access concerns with pharmacist  Educated patient to contact pharmacy regarding new prescriptions  Reviewed instructions for monitoring blood sugars at home and reminded patient to keep a written log to review with pharmacist Reminded patient of date/time of upcoming clinical pharmacist follow up and any upcoming PCP/specialists visits.  Patient denies transportation barriers to the appointment. Yes, patient denies any transportation issues.  Next clinical pharmacist appointment is scheduled for: TBD, will sent message to Care Guide Lenton Rail, RMA to schedule with PharmD for about 1 month.  Jen Eppinger, CPhT [facility] Office: [phone] Email: [email or web address]

## 2024-03-31 ENCOUNTER — Other Ambulatory Visit (HOSPITAL_COMMUNITY): Payer: Self-pay

## 2024-03-31 ENCOUNTER — Telehealth: Payer: Self-pay | Admitting: Pharmacist

## 2024-03-31 ENCOUNTER — Other Ambulatory Visit: Payer: Self-pay | Admitting: Family

## 2024-03-31 DIAGNOSIS — E1169 Type 2 diabetes mellitus with other specified complication: Secondary | ICD-10-CM

## 2024-03-31 MED ORDER — TIRZEPATIDE 2.5 MG/0.5ML ~~LOC~~ SOAJ: 2.5000 mg | SUBCUTANEOUS | 1 refills | Status: DC

## 2024-03-31 NOTE — Telephone Encounter (Signed)
 TRULICITY  0.75 MG/0.5ML SOAJ        Changed from: tirzepatide  (MOUNJARO ) 2.5 MG/0.5ML Pen   Pharmacy comment: Alternative Requested:DRUG NOT ON FORMULARY. PLEASE SEND PRIOR AUTH OR AN ALTERNATIVE.   All Pharmacy Suggested Alternatives:  Dulaglutide  (TRULICITY ) 0.75 MG/0.5ML SOAJ liraglutide (VICTOZA) 18 MG/3ML SOPN

## 2024-03-31 NOTE — Telephone Encounter (Signed)
 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 His diabetes was previously well controlled on Mounjaro  and this was also helping him lose weight. He had to switch to Trulicity  as this was the preferred option on his [employer] plan ($25 per month). Diabetes was not well controlled with Trulicity  (even on 4.5 mg dose) so he was switched to Ozempic . Ozempic  PA was approved through 01/28/25. However, since Ozempic  is non-preferred this has a higher copay ($385 per month). He would be eligible for the Ozempic  copay card, but unfortunately this has a max benefit of $100 per month. Will route to PA team to see if we are able to get Mounjaro  covered. Suspect that this will likely have a high copay similar to Ozempic  since it is not preferred, but will try given he has tried and failed the preferred option (Trulicity ) and cannot afford Ozempic .  Called the patient to discuss options above. He finished the Ozempic  0.5 mg samples and is out of medication. Will place Ozempic  1 mg sample up front for him to pick up (Lot [id number], Exp 07/14/2026). Also discussed selecting a new insurance plan when open enrollment begins on November 1st that has better coverage for his specific medications.   Derek Decker, PharmD PGY-1 Pharmacy Resident

## 2024-04-02 ENCOUNTER — Telehealth: Payer: Self-pay | Admitting: Pharmacist

## 2024-04-02 DIAGNOSIS — Z794 Long term (current) use of insulin: Secondary | ICD-10-CM

## 2024-04-02 DIAGNOSIS — Z7985 Long-term (current) use of injectable non-insulin antidiabetic drugs: Secondary | ICD-10-CM

## 2024-04-02 DIAGNOSIS — E119 Type 2 diabetes mellitus without complications: Secondary | ICD-10-CM

## 2024-04-02 NOTE — Telephone Encounter (Signed)
   New referral needed for Pharmacy services.  Patient living with T2DM and barriers to high deductible insurance plan.  Patient is improving on current therapy, however Ozempic  is now cost prohibitive.  We will attempt to get Mounjaro  as an alternative.  Will continue to follow and schedule patient back in 3 weeks with PharmD.  Gildardo Tickner Dattero Jazz Rogala, PharmD, BCACP, CPP Clinical Pharmacist, [facility]

## 2024-04-07 ENCOUNTER — Telehealth: Payer: Self-pay

## 2024-04-07 MED ORDER — DICLOFENAC SODIUM 75 MG PO TBEC: 75.0000 mg | DELAYED_RELEASE_TABLET | Freq: Two times a day (BID) | ORAL | 2 refills | Status: DC

## 2024-04-07 NOTE — Telephone Encounter (Signed)
 Diclofenac  Prescription sent to pharmacy, no other NSAIDs.

## 2024-04-07 NOTE — Telephone Encounter (Signed)
 Patient aware and verbalized understanding.

## 2024-04-07 NOTE — Telephone Encounter (Signed)
 Copied from CRM [id number]. Topic: Clinical - Prescription Issue >> Apr 06, 2024  5:36 PM Zebedee SAUNDERS wrote: Reason for CRM: Pt stated he is still experience pain in his left hand due to injury. The pain medication pt was given did not help him. Can something strong be given to pt? Pt would like a call back at [phone]

## 2024-04-07 NOTE — Addendum Note (Signed)
 Addended by: LAVELL LYE A on: 04/07/2024 04:36 PM   Modules accepted: Orders

## 2024-04-08 ENCOUNTER — Ambulatory Visit (INDEPENDENT_AMBULATORY_CARE_PROVIDER_SITE_OTHER): Admitting: Family Medicine

## 2024-04-08 ENCOUNTER — Ambulatory Visit (INDEPENDENT_AMBULATORY_CARE_PROVIDER_SITE_OTHER)

## 2024-04-08 ENCOUNTER — Ambulatory Visit: Payer: Self-pay | Admitting: Family Medicine

## 2024-04-08 ENCOUNTER — Encounter: Payer: Self-pay | Admitting: Family Medicine

## 2024-04-08 VITALS — BP 114/80 | HR 95 | Temp 98.2°F | Ht 79.0 in | Wt 326.0 lb

## 2024-04-08 DIAGNOSIS — M79645 Pain in left finger(s): Secondary | ICD-10-CM

## 2024-04-08 DIAGNOSIS — S62605A Fracture of unspecified phalanx of left ring finger, initial encounter for closed fracture: Secondary | ICD-10-CM | POA: Diagnosis not present

## 2024-04-08 DIAGNOSIS — S62625A Displaced fracture of medial phalanx of left ring finger, initial encounter for closed fracture: Secondary | ICD-10-CM | POA: Diagnosis not present

## 2024-04-08 DIAGNOSIS — S62609A Fracture of unspecified phalanx of unspecified finger, initial encounter for closed fracture: Secondary | ICD-10-CM

## 2024-04-08 MED ORDER — NABUMETONE 500 MG PO TABS
500.0000 mg | ORAL_TABLET | Freq: Four times a day (QID) | ORAL | 1 refills | Status: DC | PRN
Start: 2024-04-08 — End: 2024-05-26

## 2024-04-08 NOTE — Progress Notes (Signed)
 Subjective:  Patient ID: Derek Decker, male    DOB: 1985-10-30  Age: 38 y.o. MRN: [id number]  CC: Hand Injury (Feel 3 weeks ago. Imagining showed that finger was jammed. Ringer finger swollen and stuck straight.  Pain in hand and finger even when not using. )   HPI Derek Decker presents for Finger giving him fits. Ring finger left hand . Injured when lawn Mower came off of trailer, Caught  hand. Finger was displaced . Pt. Popped it back. Went to E.D. Was told it was jammed. Wearing splint for three weeks. Cannot flex at MCP, PIP or DIP. Painful just for touch and for all ROM.      04/08/2024    8:02 AM 03/26/2024   11:27 AM 01/17/2024    8:58 AM  Depression screen PHQ 2/9  Decreased Interest 0 0 0  Down, Depressed, Hopeless 0 0 0  PHQ - 2 Score 0 0 0  Altered sleeping 3  3  Tired, decreased energy 0  2  Change in appetite 0  2  Feeling bad or failure about yourself  0  0  Trouble concentrating 0  0  Moving slowly or fidgety/restless 0  0  Suicidal thoughts 0  0  PHQ-9 Score 3  7  Difficult doing work/chores Not difficult at all  Somewhat difficult    History Dashiel has a past medical history of Diabetes mellitus without complication (HCC), Fatty liver, HLD (hyperlipidemia), and Hypertension.   He has a past surgical history that includes Fracture surgery (Left, 2016).   His family history includes Hypertension in his father and mother.He reports that he has quit smoking. His smoking use included cigarettes. He has a 0.8 pack-year smoking history. He has never used smokeless tobacco. He reports that he does not currently use alcohol. He reports that he does not use drugs.    ROS Review of Systems  Constitutional:  Negative for fever.  Respiratory:  Negative for shortness of breath.   Cardiovascular:  Negative for chest pain.  Musculoskeletal:  Negative for arthralgias.  Skin:  Negative for rash.    Objective:  BP 114/80   Pulse 95   Temp 98.2 F (36.8 C)   Ht 6' 7  (2.007 m)   Wt (!) 326 lb (147.9 kg)   SpO2 96%   BMI 36.73 kg/m   BP Readings from Last 3 Encounters:  04/08/24 114/80  03/26/24 124/82  01/17/24 132/85    Wt Readings from Last 3 Encounters:  04/08/24 (!) 326 lb (147.9 kg)  03/26/24 (!) 324 lb (147 kg)  01/17/24 (!) 330 lb (149.7 kg)     Physical Exam Vitals reviewed.  Constitutional:      Appearance: He is well-developed.  HENT:     Head: Normocephalic and atraumatic.     Right Ear: External ear normal.     Left Ear: External ear normal.     Mouth/Throat:     Pharynx: No oropharyngeal exudate or posterior oropharyngeal erythema.   Eyes:     Pupils: Pupils are equal, round, and reactive to light.    Cardiovascular:     Rate and Rhythm: Normal rate and regular rhythm.     Heart sounds: No murmur heard. Pulmonary:     Effort: No respiratory distress.     Breath sounds: Normal breath sounds.   Musculoskeletal:     Cervical back: Normal range of motion and neck supple.   Neurological:     Mental Status: He  is alert and oriented to person, place, and time.   XR possible sublux at R 4th PIP. Hairline fx, Preliminary reading done by Butler Zollie ROLLA Jerel for wet read.   Assessment & Plan:  Pain in finger of left hand -     DG Finger Ring Left; Future -     Ambulatory referral to Occupational Therapy  Closed fracture dislocation of finger, initial encounter  Other orders -     Nabumetone; Take 1 tablet (500 mg total) by mouth 4 (four) times daily as needed for moderate pain (pain score 4-6). For muscle and joint pain  Dispense: 60 tablet; Refill: 1    Customized aluminum splint with ace applied.   Follow-up: with Ortho urgent care, ASAP (walk in )  Butler Zollie, M.D.

## 2024-04-09 ENCOUNTER — Telehealth: Payer: Self-pay

## 2024-04-09 ENCOUNTER — Emergency Department (HOSPITAL_COMMUNITY)

## 2024-04-09 ENCOUNTER — Emergency Department (HOSPITAL_COMMUNITY)
Admission: EM | Admit: 2024-04-09 | Discharge: 2024-04-09 | Disposition: A | Discharge status: Home / Self Care | Attending: Student | Admitting: Student

## 2024-04-09 ENCOUNTER — Encounter (HOSPITAL_COMMUNITY): Payer: Self-pay

## 2024-04-09 ENCOUNTER — Other Ambulatory Visit: Payer: Self-pay

## 2024-04-09 ENCOUNTER — Encounter: Payer: Self-pay | Admitting: Family Medicine

## 2024-04-09 DIAGNOSIS — S62625A Displaced fracture of medial phalanx of left ring finger, initial encounter for closed fracture: Secondary | ICD-10-CM | POA: Diagnosis not present

## 2024-04-09 DIAGNOSIS — Z79899 Other long term (current) drug therapy: Secondary | ICD-10-CM | POA: Diagnosis not present

## 2024-04-09 DIAGNOSIS — I10 Essential (primary) hypertension: Secondary | ICD-10-CM | POA: Insufficient documentation

## 2024-04-09 DIAGNOSIS — W230XXA Caught, crushed, jammed, or pinched between moving objects, initial encounter: Secondary | ICD-10-CM | POA: Insufficient documentation

## 2024-04-09 DIAGNOSIS — S6992XA Unspecified injury of left wrist, hand and finger(s), initial encounter: Secondary | ICD-10-CM | POA: Diagnosis not present

## 2024-04-09 DIAGNOSIS — S62615A Displaced fracture of proximal phalanx of left ring finger, initial encounter for closed fracture: Secondary | ICD-10-CM | POA: Insufficient documentation

## 2024-04-09 DIAGNOSIS — S63285A Dislocation of proximal interphalangeal joint of left ring finger, initial encounter: Secondary | ICD-10-CM | POA: Diagnosis not present

## 2024-04-09 DIAGNOSIS — S62609A Fracture of unspecified phalanx of unspecified finger, initial encounter for closed fracture: Secondary | ICD-10-CM

## 2024-04-09 DIAGNOSIS — E119 Type 2 diabetes mellitus without complications: Secondary | ICD-10-CM | POA: Insufficient documentation

## 2024-04-09 DIAGNOSIS — S62629A Displaced fracture of medial phalanx of unspecified finger, initial encounter for closed fracture: Secondary | ICD-10-CM | POA: Diagnosis not present

## 2024-04-09 DIAGNOSIS — Z794 Long term (current) use of insulin: Secondary | ICD-10-CM | POA: Diagnosis not present

## 2024-04-09 MED ORDER — POVIDONE-IODINE 10 % EX SOLN: CUTANEOUS | Status: DC | PRN

## 2024-04-09 MED ORDER — HYDROCODONE-ACETAMINOPHEN 5-325 MG PO TABS: 1.0000 | ORAL_TABLET | Freq: Four times a day (QID) | ORAL | 0 refills | Status: DC | PRN

## 2024-04-09 MED ORDER — LIDOCAINE HCL (PF) 2 % IJ SOLN
INTRAMUSCULAR | Status: AC
  Administered 2024-04-09: 5 mL
  Filled 2024-04-09: qty 5

## 2024-04-09 MED ORDER — LIDOCAINE HCL (PF) 2 % IJ SOLN: 5.0000 mL | Freq: Once | INTRAMUSCULAR | Status: AC

## 2024-04-09 NOTE — Telephone Encounter (Signed)
 Patient called and wants to talk to nurse about message put in his MyChart.

## 2024-04-09 NOTE — ED Triage Notes (Signed)
 Pt arrived via POV following a conversation he had with his Primary Provider Dr. Zollie and Pt reports he was advised to go to the ED for a cast to be placed on his left ring finger due to recent Xray results showing his finger is broke. Pt reports fracture initially occurred apprx 3 weeks ago.

## 2024-04-09 NOTE — Telephone Encounter (Signed)
 Copied from CRM [id number]. Topic: Clinical - Medical Advice >> Apr 09, 2024 11:11 AM Powell HERO wrote: Reason for CRM: Patient calling in to get more information on if he needs to go to Urgent care for a cast on his finger, looking for answer asap to get it completed if necessary. [facility] did not accept call, states the received a mychart message from him and will respond to that when they are available to. Patient verbalized understanding.

## 2024-04-09 NOTE — Telephone Encounter (Signed)
 Discussed with patient. LS

## 2024-04-09 NOTE — Discharge Instructions (Addendum)
 Dr. Margrette will be reaching out to you for definitive treatment for your finger injury. Call him if you have not heard from his office within the next day to arrange followup care.   Do not drive within 4 hours of taking hydrocodone as this medication will make you drowsy.  You may take it for significant pain in your finger, especially at nighttime if needed for relief or sleeping.

## 2024-04-09 NOTE — ED Provider Notes (Signed)
 [facility] AT [facility] Provider Note   CSN: [id number] Arrival date & time: 04/09/24  1359     Patient presents with: Finger Injury   Derek Decker is a 38 y.o. male with a history including diabetes, hypertension, hyperlipidemia and GERD presenting for evaluation of left ring finger injury.  Approximately 3 weeks ago he injured this hand when a lawnmower was rolling off of his trailer, it caught his hand, specifically his left ring finger.  Initially patient said it was displaced but he was able to pop it back into place.  He was seen at an outside hospital and x-rays suggested he had a sprain.  He has been wearing a splint for the past 3 weeks but after receiving no improvement in pain and inability to flex this finger he saw his PCP yesterday.  X-rays resulting today revealing for comminuted dislocation at the proximal joint.  He was placed in a molded aluminum splint yesterday, was advised to come to the ED today for full casting and follow-up care.  He denies numbness in the finger.  He denies any other injuries.  He has taken ibuprofen with no significant improvement in pain.  He was given a prescription for nabumetone  yesterday, he states it made him very nauseous overnight.   The history is provided by the patient.       Prior to Admission medications   Medication Sig Start Date End Date Taking? Authorizing Provider  HYDROcodone -acetaminophen  (NORCO/VICODIN) 5-325 MG tablet Take 1 tablet by mouth every 6 (six) hours as needed. 04/09/24  Yes Brissia Delisa, Mliss, PA-C  albuterol  (VENTOLIN  HFA) 108 (90 Base) MCG/ACT inhaler TAKE 2 PUFFS BY MOUTH EVERY 6 HOURS AS NEEDED FOR WHEEZE OR SHORTNESS OF BREATH 06/10/23   Hawks, Bari A, FNP  amLODipine  (NORVASC ) 5 MG tablet TAKE 1 TABLET (5 MG TOTAL) BY MOUTH DAILY. 12/02/23   Lavell Bari A, FNP  atorvastatin  (LIPITOR) 20 MG tablet Take 1 tablet (20 mg total) by mouth daily. 03/26/24   Lavell Bari LABOR, FNP   cetirizine  (ZYRTEC ) 10 MG tablet Take 1 tablet (10 mg total) by mouth daily. 01/30/24   Lavell Bari LABOR, FNP  Continuous Glucose Sensor (DEXCOM G7 SENSOR) MISC CHANGE SENSOR EVERY 10 DAYS AS DIRECTED 01/28/24   Lavell Bari A, FNP  diclofenac  (VOLTAREN ) 75 MG EC tablet Take 1 tablet (75 mg total) by mouth 2 (two) times daily. 04/07/24   Lavell Bari A, FNP  glucose blood (ONETOUCH VERIO) test strip USE 1 STRIP TO CHECK GLUCOSE four times a day 04/13/20   Lavell Bari A, FNP  insulin degludec  (TRESIBA  FLEXTOUCH) 200 UNIT/ML FlexTouch Pen Inject 20 Units into the skin daily. 01/28/24   Lavell Bari LABOR, FNP  Insulin Pen Needle (PEN NEEDLES) 32G X 6 MM MISC Use to inject insulin daily 12/17/23   Lavell Bari A, FNP  lisinopril  (ZESTRIL ) 40 MG tablet TAKE 1 TABLET BY MOUTH EVERY DAY 02/03/24   Lavell Bari A, FNP  metFORMIN  (GLUCOPHAGE -XR) 750 MG 24 hr tablet TAKE 2 TABLETS (1,500 MG TOTAL) BY MOUTH EVERY DAY WITH BREAKFAST 03/17/24   Lavell Bari A, FNP  montelukast  (SINGULAIR ) 10 MG tablet TAKE 1 TABLET BY MOUTH EVERYDAY AT BEDTIME 01/03/24   Hawks, Bari A, FNP  nabumetone  (RELAFEN ) 500 MG tablet Take 1 tablet (500 mg total) by mouth 4 (four) times daily as needed for moderate pain (pain score 4-6). For muscle and joint pain 04/08/24   Zollie Lowers, MD  omeprazole  (  PRILOSEC) 20 MG capsule TAKE 1 CAPSULE BY MOUTH EVERY DAY BEFORE BREAKFAST 03/26/24   Hawks, Bari A, FNP  Semaglutide , 1 MG/DOSE, 4 MG/3ML SOPN Inject 1 mg as directed once a week. 03/26/24   Lavell Bari A, FNP  tirzepatide  (MOUNJARO ) 2.5 MG/0.5ML Pen Inject 2.5 mg into the skin once a week. 03/31/24   Lavell Bari A, FNP  Dulaglutide  (TRULICITY ) 3 MG/0.5ML SOAJ Inject 3 mg as directed once a week. Patient not taking: Reported on 02/26/2024 01/17/24   Lavell Bari LABOR, FNP    Allergies: Patient has no known allergies.    Review of Systems  Constitutional:  Negative for fever.  Musculoskeletal:  Positive for arthralgias and  joint swelling. Negative for myalgias.  Neurological:  Negative for weakness and numbness.    Updated Vital Signs BP (!) 153/86 (BP Location: Right Arm)   Pulse (!) 116   Temp 98 F (36.7 C)   Resp 19   Ht 6' 7 (2.007 m)   Wt (!) 147.9 kg   SpO2 98%   BMI 36.73 kg/m   Physical Exam  Musculoskeletal:     Left hand: Bony tenderness present. Decreased range of motion.     Comments: Patient does have moderate edema of his left ring finger, distal sensation is intact with less than 2-second cap refill.  He is most tender to palpation around his PIP joint.  It does not feel significantly dislocated despite imaging which were reviewed.  He is unable to flex the finger at the PIP.     (all labs ordered are listed, but only abnormal results are displayed) Labs Reviewed - No data to display  EKG: None  Radiology: DG Finger Ring Left Result Date: 04/09/2024 CLINICAL DATA:  post reduction. EXAM: LEFT RING FINGER 2+V COMPARISON:  Radiograph from earlier the same day. FINDINGS: Status post attempted reduction of proximal interphalangeal joint of the ring finger. There is mild improvement when compared to the prior exam; however, the subluxation remains. Redemonstration of oblique avulsion fracture along the volar base of the middle phalanx. No other acute fracture or dislocation. No aggressive osseous lesion. No significant arthritis of imaged joints. No radiopaque foreign bodies. Soft tissues are within normal limits. IMPRESSION: *Status post attempted reduction of proximal interphalangeal joint of the ring finger. There is mild improvement when compared to the prior exam; however, the subluxation remains. Redemonstration of oblique avulsion fracture along the volar base of the middle phalanx. Electronically Signed   By: Ree Molt M.D.   On: 04/09/2024 17:23   DG Finger Ring Left Result Date: 04/09/2024 CLINICAL DATA:  [id number] Dislocation closed [id number] EXAM: LEFT RING FINGER 2+V  COMPARISON:  April 08, 2024 FINDINGS: Redemonstrated dorsal dislocation of the fourth digit (ring finger), at the proximal interphalangeal joint. Obliquely oriented, displaced fracture through the volar base of the fourth middle phalanx. There is no evidence of arthropathy or other focal bone abnormality. Soft tissues are unremarkable. No radiopaque foreign body. IMPRESSION: Similar dorsal dislocation of the fourth digit at the PIP joint. Obliquely oriented, displaced fracture through the volar base of the middle phalanx with intra-articular extension. Electronically Signed   By: Rogelia Myers M.D.   On: 04/09/2024 16:19   DG Finger Ring Left Result Date: 04/08/2024 CLINICAL DATA:  Pain after injury 3 weeks ago. EXAM: LEFT RING FINGER 3V COMPARISON:  None Available. FINDINGS: Soft tissue swelling about the fourth digit with a comminuted displaced fracture along the volar base of the middle phalanx of the fourth  digit. There is also dislocation of the proximal interphalangeal joint. The mid and distal phalanges are dorsal to the proximal. IMPRESSION: Fracture and dislocation of the proximal interphalangeal joint with soft tissue swelling. Electronically Signed   By: Ranell Bring M.D.   On: 04/08/2024 15:20     .Nerve Block  Date/Time: 04/09/2024 5:28 PM  Performed by: Birdena Clarity, PA-C Authorized by: Birdena Clarity, PA-C   Consent:    Consent obtained:  Verbal   Consent given by:  Patient   Risks, benefits, and alternatives were discussed: yes     Risks discussed:  Swelling, pain, intravenous injection and nerve damage Location:    Body area:  Upper extremity   Upper extremity nerve blocked: digital.   Laterality:  Left Pre-procedure details:    Skin preparation:  Alcohol Skin anesthesia:    Skin anesthesia method:  None Procedure details:    Block needle gauge:  25 G   Anesthetic injected:  Lidocaine  2% w/o epi   Steroid injected:  None   Additive injected:  None   Injection procedure:   Anatomic landmarks identified, introduced needle, negative aspiration for blood, incremental injection and anatomic landmarks palpated Post-procedure details:    Dressing:  None   Outcome:  Anesthesia achieved   Procedure completion:  Tolerated well, no immediate complications      Attempted reduction of finger by Dr. Albertina, post reduction films, no sig improvement.   Medications Ordered in the ED  povidone-iodine  (BETADINE ) 10 % external solution (has no administration in time range)  lidocaine  HCl (PF) (XYLOCAINE ) 2 % injection 5 mL (5 mLs Other Given 04/09/24 1643)                                    Medical Decision Making Patient presenting with a 39-week old injury confirmed as a fracture dislocation of his left ring PIP joint.  Imaging repeated today to confirm dislocation as his physical exam did not suggest a significant dislocated joint.  Imaging reviewed and attempt at relocation of the distal finger attempted without significant improvement.  Call placed to Dr. Margrette who is on hand call for us  today.  He will arrange follow-up care for this patient, but states he will need surgical intervention, being 3 weeks out relocation will not be possible at this time.  He may need fusion of the joint as definitive treatment.  Amount and/or Complexity of Data Reviewed Radiology: ordered.    Details: Imaging reviewed,  still sig change in dislocated joint.  Risk Prescription drug management.        Final diagnoses:  Closed fracture dislocation of finger, initial encounter    ED Discharge Orders          Ordered    HYDROcodone -acetaminophen  (NORCO/VICODIN) 5-325 MG tablet  Every 6 hours PRN        04/09/24 1745               Buzz Axel, PA-C 04/09/24 1748    Kommor, [place], MD 04/10/24 [phone]

## 2024-04-10 ENCOUNTER — Telehealth: Payer: Self-pay | Admitting: Pharmacist

## 2024-04-10 ENCOUNTER — Other Ambulatory Visit (INDEPENDENT_AMBULATORY_CARE_PROVIDER_SITE_OTHER)

## 2024-04-10 DIAGNOSIS — Z794 Long term (current) use of insulin: Secondary | ICD-10-CM

## 2024-04-10 DIAGNOSIS — E119 Type 2 diabetes mellitus without complications: Secondary | ICD-10-CM

## 2024-04-10 DIAGNOSIS — Z7985 Long-term (current) use of injectable non-insulin antidiabetic drugs: Secondary | ICD-10-CM

## 2024-04-10 NOTE — Progress Notes (Unsigned)
   04/10/2024 Name: Derek Decker MRN: [id number] DOB: 05/12/1986  No chief complaint on file.   Derek Decker is a 38 y.o. year old male who presented for a telephone visit.   They were referred to the pharmacist by {referredtopharmacy:27270} for assistance in managing {referralreason:27271}.   Subjective:  Care Team: Primary Care Provider: Lavell Bari LABOR, FNP ; Next Scheduled Visit: *** {careteamprovider:27366}  Medication Access/Adherence  Current Pharmacy:  [phone] [place] [place] - [address] [place] [address] Phone: [phone] Fax: [phone]  [facility] [place] [place] - [address] [place] [address] Phone: [phone] Fax: [phone]  [facility] [place], [place] - [address] [place] [address] Phone: [phone] Fax: [phone]   Patient reports affordability concerns with their medications: {YES/NO:21197} Patient reports access/transportation concerns to their pharmacy: {YES/NO:21197} Patient reports adherence concerns with their medications:  {YES/NO:21197} ***   {Pharmacy S/O Choices:26420}   Objective:  Lab Results  Component Value Date   HGBA1C 7.6 (H) 03/26/2024    Lab Results  Component Value Date   CREATININE 0.87 03/26/2024   BUN 8 03/26/2024   NA 140 03/26/2024   K 4.7 03/26/2024   CL 104 03/26/2024   CO2 20 03/26/2024    Lab Results  Component Value Date   CHOL 100 03/26/2024   HDL 40 03/26/2024   LDLCALC 44 03/26/2024   TRIG 79 03/26/2024   CHOLHDL 2.5 03/26/2024    Medications Reviewed Today   Medications were not reviewed in this encounter       Assessment/Plan:   {Pharmacy A/P Choices:26421}  Follow Up Plan: ***  ***

## 2024-04-13 ENCOUNTER — Other Ambulatory Visit (HOSPITAL_COMMUNITY): Payer: Self-pay

## 2024-04-13 NOTE — Telephone Encounter (Signed)
 PA submitted via cover my meds by PharmD on 04/13/24 Will f/u next week Patient has 1 month of Ozempic  remaining so not urgent

## 2024-04-13 NOTE — Telephone Encounter (Signed)
 Pharmacy Patient Advocate Encounter  Received notification from [employer] that Prior Authorization for  Mounjaro  5 mg/0.5 ml pen has been APPROVED from 04/10/24 to 04/10/25. Ran test claim, Copay is $267.26. This test claim was processed through [facility]- copay amounts may vary at other pharmacies due to pharmacy/plan contracts, or as the patient moves through the different stages of their insurance plan.   PA #/Case ID/Reference #: --  *high copay due to a $675 deductible

## 2024-04-13 NOTE — Telephone Encounter (Signed)
Left message making pt aware and to call back if needed.

## 2024-04-15 ENCOUNTER — Other Ambulatory Visit: Payer: Self-pay | Admitting: Family

## 2024-04-15 DIAGNOSIS — E1169 Type 2 diabetes mellitus with other specified complication: Secondary | ICD-10-CM

## 2024-04-16 ENCOUNTER — Telehealth: Payer: Self-pay | Admitting: Radiology

## 2024-04-16 ENCOUNTER — Encounter: Admitting: Orthopedic Surgery

## 2024-04-16 ENCOUNTER — Telehealth: Payer: Self-pay | Admitting: Pharmacist

## 2024-04-16 ENCOUNTER — Ambulatory Visit: Admitting: Orthopedic Surgery

## 2024-04-16 VITALS — BP 138/88 | HR 97 | Ht 79.0 in | Wt 326.0 lb

## 2024-04-16 DIAGNOSIS — S62609A Fracture of unspecified phalanx of unspecified finger, initial encounter for closed fracture: Secondary | ICD-10-CM | POA: Diagnosis not present

## 2024-04-16 DIAGNOSIS — E1169 Type 2 diabetes mellitus with other specified complication: Secondary | ICD-10-CM

## 2024-04-16 MED ORDER — TIRZEPATIDE 5 MG/0.5ML ~~LOC~~ SOAJ
5.0000 mg | SUBCUTANEOUS | 1 refills | Status: DC
Start: 2024-04-16 — End: 2024-04-22

## 2024-04-16 NOTE — Patient Instructions (Signed)
 We are referring you to [facility] from [facility] address is [address] [place] The phone number is [phone]  The office will call you with an appointment Dr. Erwin is the hand surgeon the office will call you with appointment, if you do not hear anything by Tuesday next week, please call them to schedule

## 2024-04-16 NOTE — Progress Notes (Signed)
 Office Visit Note   Patient: Derek Decker           Date of Birth: 1986-09-26           MRN: [id number] Visit Date: 04/16/2024 Requested by: Lavell Bari LABOR, FNP [address] [place],  [place] [address] PCP: Lavell Bari LABOR, FNP   Assessment & Plan:   Encounter Diagnosis  Name Primary?   Closed fracture dislocation of distal interphalangeal (DIP) joint of finger, initial encounter Yes    No orders of the defined types were placed in this encounter.   38 year old male injured his finger June 5 presented to medical attention June 25 with a dislocated PIP joint of the left ring finger  Referral for hand surgeon to evaluate and manage, this is not in the scope of my practice   Subjective: Chief Complaint  Patient presents with   Fracture    L ring finger DOI 03/19/24    HPI: 38 year old male right-hand-dominant fell off of a lawnmower injured his left hand more specifically left ring finger PIP joint on June 5.  He went to the emergency room had x-rays they were negative.  He followed up with primary care on June 25 x-rays there showed a dislocated PIP joint he was sent to [facility] where they attempted closed reduction unsuccessfully and he was referred to us  as I was on-call              ROS: Negative   Images personally read and my interpretation : 3 sets of films are noted from June 25th and 6th all of which show a PIP joint proximal dorsal dislocation with a bone fragment volarly  Visit Diagnoses:  1. Closed fracture dislocation of distal interphalangeal (DIP) joint of finger, initial encounter      Follow-Up Instructions: Return for REFERRAL.    Objective: Vital Signs: BP 138/88   Pulse 97   Ht 6' 7 (2.007 m)   Wt (!) 326 lb (147.9 kg)   BMI 36.73 kg/m   Physical Exam Vitals and nursing note reviewed.  Constitutional:      Appearance: Normal appearance.  HENT:     Head: Normocephalic and atraumatic.  Eyes:     General: No  scleral icterus.       Right eye: No discharge.        Left eye: No discharge.     Extraocular Movements: Extraocular movements intact.     Conjunctiva/sclera: Conjunctivae normal.     Pupils: Pupils are equal, round, and reactive to light.  Cardiovascular:     Rate and Rhythm: Normal rate.     Pulses: Normal pulses.  Skin:    General: Skin is warm and dry.     Capillary Refill: Capillary refill takes less than 2 seconds.  Neurological:     General: No focal deficit present.     Mental Status: He is alert and oriented to person, place, and time.  Psychiatric:        Mood and Affect: Mood normal.        Behavior: Behavior normal.        Thought Content: Thought content normal.        Judgment: Judgment normal.      Left Hand Exam   Comments:  Left hand looks straight but looks swollen no motion at the PIP joint neurovascular exam intact       Specialty Comments:  No specialty comments available.  Imaging: No results  found.   PMFS History: Patient Active Problem List   Diagnosis Date Noted   Mild intermittent asthma without complication 04/08/2023   Elevated LFTs 06/27/2020   Gastroesophageal reflux disease 06/27/2020   Hepatic steatosis 04/14/2020   Hyperlipidemia associated with type 2 diabetes mellitus (HCC) 04/13/2020   Hypertension associated with type 2 diabetes mellitus (HCC) 07/10/2019   Morbid obesity (HCC) 07/10/2019   Diabetes mellitus (HCC) 02/26/2019   Past Medical History:  Diagnosis Date   Diabetes mellitus without complication (HCC)    Fatty liver    HLD (hyperlipidemia)    Hypertension     Family History  Problem Relation Age of Onset   Hypertension Mother    Hypertension Father    Liver disease Neg Hx    Colon cancer Neg Hx     Past Surgical History:  Procedure Laterality Date   FRACTURE SURGERY Left 2016   left foot   Social History   Occupational History   Not on file  Tobacco Use   Smoking status: Former    Current  packs/day: 0.25    Average packs/day: 0.3 packs/day for 3.0 years (0.8 ttl pk-yrs)    Types: Cigarettes    Passive exposure: Past   Smokeless tobacco: Never  Vaping Use   Vaping status: Never Used  Substance and Sexual Activity   Alcohol use: Not Currently   Drug use: No   Sexual activity: Not on file

## 2024-04-16 NOTE — Progress Notes (Signed)
  Intake history:  Ht 6' 7 (2.007 m)   Wt (!) 326 lb (147.9 kg)   BMI 36.73 kg/m  Body mass index is 36.73 kg/m.    WHAT ARE WE SEEING YOU FOR TODAY?   left hand(s) ring finger  How long has this bothered you? (DOI?DOS?WS?)  on 03/19/24  Anticoag.  No  Diabetes Yes  Heart disease No  Hypertension Yes  SMOKING HX No  Kidney disease No  Any ALLERGIES ______________________________________________   Treatment:  Have you taken:  Tylenol  No  Advil Yes  Had PT No  Had injection No  Other  _________________________

## 2024-04-16 NOTE — Telephone Encounter (Signed)
 [name] states Dr Erwin will be back Monday so I left referral as it is, thank you / advised Dr Margrette

## 2024-04-16 NOTE — Telephone Encounter (Signed)
-----   Message from Harrold sent at 04/16/2024  3:51 PM EDT ----- Note I forgot to tell someone midst of all the chaos that this patient cannot go to Dr. Erwin as Dr. Erwin is out of town for several weeks for a family issue

## 2024-04-16 NOTE — Telephone Encounter (Signed)
 Who are we referring for hand surgery since Dr Erwin is off? I was not aware

## 2024-04-16 NOTE — Telephone Encounter (Signed)
 Mounjaro  approved by insurance Will start with 5mg  weekly and titrate as tolerated/needed [facility] Will send mychart message to include copay card

## 2024-04-22 ENCOUNTER — Telehealth: Payer: Self-pay | Admitting: Pharmacist

## 2024-04-22 DIAGNOSIS — E119 Type 2 diabetes mellitus without complications: Secondary | ICD-10-CM

## 2024-04-22 MED ORDER — TRULICITY 4.5 MG/0.5ML ~~LOC~~ SOAJ: 4.5000 mg | SUBCUTANEOUS | 5 refills | Status: DC

## 2024-04-22 NOTE — Telephone Encounter (Signed)
   Patient to switch back to Trulicity  4.5mg  weekly.  His insurance covers Ozempic  and Mounjaro  now, however his copays are very high ($400+).  Discussed therapy with patient.  He is to continue all other medications as prescribed.  Derek Decker Suzi Hernan, PharmD, BCACP, CPP Clinical Pharmacist, [facility]

## 2024-04-27 ENCOUNTER — Other Ambulatory Visit: Payer: Self-pay | Admitting: Orthopedic Surgery

## 2024-04-27 DIAGNOSIS — M79642 Pain in left hand: Secondary | ICD-10-CM

## 2024-04-30 ENCOUNTER — Ambulatory Visit
Admission: RE | Admit: 2024-04-30 | Discharge: 2024-04-30 | Disposition: A | Discharge status: Home / Self Care | Source: Ambulatory Visit | Attending: Orthopedic Surgery | Admitting: Orthopedic Surgery

## 2024-04-30 DIAGNOSIS — R600 Localized edema: Secondary | ICD-10-CM | POA: Diagnosis not present

## 2024-04-30 DIAGNOSIS — S62625A Displaced fracture of medial phalanx of left ring finger, initial encounter for closed fracture: Secondary | ICD-10-CM | POA: Diagnosis not present

## 2024-04-30 DIAGNOSIS — M79642 Pain in left hand: Secondary | ICD-10-CM

## 2024-05-04 DIAGNOSIS — S62625A Displaced fracture of medial phalanx of left ring finger, initial encounter for closed fracture: Secondary | ICD-10-CM | POA: Diagnosis not present

## 2024-05-12 DIAGNOSIS — Y999 Unspecified external cause status: Secondary | ICD-10-CM | POA: Diagnosis not present

## 2024-05-12 DIAGNOSIS — S62623A Displaced fracture of medial phalanx of left middle finger, initial encounter for closed fracture: Secondary | ICD-10-CM | POA: Diagnosis not present

## 2024-05-12 DIAGNOSIS — M24642 Ankylosis, left hand: Secondary | ICD-10-CM | POA: Diagnosis not present

## 2024-05-12 DIAGNOSIS — S63287A Dislocation of proximal interphalangeal joint of left little finger, initial encounter: Secondary | ICD-10-CM | POA: Diagnosis not present

## 2024-05-18 ENCOUNTER — Ambulatory Visit (HOSPITAL_COMMUNITY): Attending: Family | Admitting: Occupational Therapy

## 2024-05-18 ENCOUNTER — Encounter (HOSPITAL_COMMUNITY): Payer: Self-pay | Admitting: Occupational Therapy

## 2024-05-18 DIAGNOSIS — M79645 Pain in left finger(s): Secondary | ICD-10-CM | POA: Insufficient documentation

## 2024-05-18 DIAGNOSIS — M79644 Pain in right finger(s): Secondary | ICD-10-CM | POA: Insufficient documentation

## 2024-05-18 NOTE — Therapy (Signed)
 [facility] at [facility] [address] [place], [place], [address] Phone: [phone]   Fax:  [phone]  Patient Details  Name: Derek Decker MRN: [id number] Date of Birth: [birth year] Referring Provider:  Lavell Bari LABOR, FNP  Encounter Date: 05/18/2024  Cancellation Note:   Pt arrived for OT evaluation, however reports he had surgery last week. Pt is being seen from old referral, explained we may need a new referral from the surgeon. Pt reports surgeon's office told him to come on to therapy when he told them he had an appointment. OT called [facility], receptionist Rosaline reports they have not ordered therapy yet and he should not be seen until after his follow up on 05/25/24. Will call pt to cx appointments until after 8/11 and will need new order and protocol from MD.     Sonny Cory, OTR/L  [phone] 05/18/2024, 4:02 PM  [facility] at [facility] [address] [place], [place], [address] Phone: [phone]   Fax:  [phone]

## 2024-05-25 ENCOUNTER — Encounter (HOSPITAL_COMMUNITY): Admitting: Occupational Therapy

## 2024-05-25 DIAGNOSIS — M79645 Pain in left finger(s): Secondary | ICD-10-CM | POA: Diagnosis not present

## 2024-05-25 DIAGNOSIS — S62625D Displaced fracture of medial phalanx of left ring finger, subsequent encounter for fracture with routine healing: Secondary | ICD-10-CM | POA: Diagnosis not present

## 2024-05-26 ENCOUNTER — Ambulatory Visit (INDEPENDENT_AMBULATORY_CARE_PROVIDER_SITE_OTHER): Admitting: Family

## 2024-05-26 VITALS — BP 119/81 | HR 103 | Temp 97.8°F | Ht 79.0 in | Wt 314.0 lb

## 2024-05-26 DIAGNOSIS — E1159 Type 2 diabetes mellitus with other circulatory complications: Secondary | ICD-10-CM | POA: Diagnosis not present

## 2024-05-26 DIAGNOSIS — E1169 Type 2 diabetes mellitus with other specified complication: Secondary | ICD-10-CM | POA: Diagnosis not present

## 2024-05-26 DIAGNOSIS — J4520 Mild intermittent asthma, uncomplicated: Secondary | ICD-10-CM

## 2024-05-26 DIAGNOSIS — I152 Hypertension secondary to endocrine disorders: Secondary | ICD-10-CM

## 2024-05-26 DIAGNOSIS — E785 Hyperlipidemia, unspecified: Secondary | ICD-10-CM

## 2024-05-26 DIAGNOSIS — K219 Gastro-esophageal reflux disease without esophagitis: Secondary | ICD-10-CM | POA: Diagnosis not present

## 2024-05-26 DIAGNOSIS — Z794 Long term (current) use of insulin: Secondary | ICD-10-CM

## 2024-05-26 DIAGNOSIS — K760 Fatty (change of) liver, not elsewhere classified: Secondary | ICD-10-CM | POA: Diagnosis not present

## 2024-05-26 DIAGNOSIS — E119 Type 2 diabetes mellitus without complications: Secondary | ICD-10-CM | POA: Diagnosis not present

## 2024-05-26 MED ORDER — TRULICITY 4.5 MG/0.5ML ~~LOC~~ SOAJ: 4.5000 mg | SUBCUTANEOUS | 5 refills | Status: DC

## 2024-05-26 MED ORDER — TRESIBA FLEXTOUCH 200 UNIT/ML ~~LOC~~ SOPN: 24.0000 [IU] | PEN_INJECTOR | SUBCUTANEOUS | 2 refills | Status: DC

## 2024-05-26 NOTE — Progress Notes (Signed)
 Subjective:    Patient ID: Derek Decker, male    DOB: [birth year], 38 y.o.   MRN: [id number]  Chief Complaint  Patient presents with   Medical Management of Chronic Issues   Pt presents to the office today for chronic follow up.    He is followed by GI for fatty liver and followed by dietitian as needed.  He is morbid obese with a BMI of 35 and DM and HTN.    He is taking Trulicity  4.5 mg weekly. His starting weight was 334 lb. He has lost 20 lbs.      05/26/2024    3:38 PM 04/16/2024    2:47 PM 04/09/2024    2:05 PM  Last 3 Weights  Weight (lbs) 314 lb 326 lb 326 lb  Weight (kg) 142.429 kg 147.873 kg 147.873 kg     Hypertension This is a chronic problem. The current episode started more than 1 year ago. The problem has been resolved since onset. The problem is controlled. Pertinent negatives include no blurred vision, malaise/fatigue, peripheral edema or shortness of breath. Risk factors for coronary artery disease include dyslipidemia, diabetes mellitus, obesity and male gender. The current treatment provides moderate improvement.  Diabetes He presents for his follow-up diabetic visit. He has type 2 diabetes mellitus. Pertinent negatives for diabetes include no blurred vision and no foot paresthesias. Risk factors for coronary artery disease include dyslipidemia, diabetes mellitus, hypertension, male sex and sedentary lifestyle. He is following a generally unhealthy diet. His overall blood glucose range is 110-130 mg/dl.  Asthma There is no cough, shortness of breath or wheezing. This is a chronic problem. The problem occurs intermittently. The problem has been waxing and waning. Associated symptoms include heartburn. Pertinent negatives include no malaise/fatigue. He reports moderate improvement on treatment. His past medical history is significant for asthma.  Gastroesophageal Reflux He complains of belching and heartburn. He reports no coughing or no wheezing. This is a chronic  problem. The current episode started more than 1 year ago. The problem occurs rarely. The symptoms are aggravated by certain foods. Risk factors include obesity. He has tried a PPI for the symptoms. The treatment provided moderate relief.  Hyperlipidemia This is a chronic problem. The current episode started more than 1 year ago. The problem is controlled. Recent lipid tests were reviewed and are normal. Exacerbating diseases include obesity. Pertinent negatives include no shortness of breath. Current antihyperlipidemic treatment includes statins. The current treatment provides moderate improvement of lipids. Risk factors for coronary artery disease include dyslipidemia, diabetes mellitus, male sex, hypertension and a sedentary lifestyle.      Review of Systems  Constitutional:  Negative for malaise/fatigue.  Eyes:  Negative for blurred vision.  Respiratory:  Negative for cough, shortness of breath and wheezing.   Gastrointestinal:  Positive for heartburn.  All other systems reviewed and are negative.  Family History  Problem Relation Age of Onset   Hypertension Mother    Hypertension Father    Liver disease Neg Hx    Colon cancer Neg Hx    Social History   Socioeconomic History   Marital status: Single    Spouse name: Not on file   Number of children: Not on file   Years of education: Not on file   Highest education level: 12th grade  Occupational History   Not on file  Tobacco Use   Smoking status: Former    Current packs/day: 0.25    Average packs/day: 0.3 packs/day for 3.0 years (  0.8 ttl pk-yrs)    Types: Cigarettes    Passive exposure: Past   Smokeless tobacco: Never  Vaping Use   Vaping status: Never Used  Substance and Sexual Activity   Alcohol use: Not Currently   Drug use: No   Sexual activity: Not on file  Other Topics Concern   Not on file  Social History Narrative   Not on file   Social Drivers of Health   Financial Resource Strain: Low Risk  (05/26/2024)    Overall Financial Resource Strain (CARDIA)    Difficulty of Paying Living Expenses: Not hard at all  Food Insecurity: No Food Insecurity (05/26/2024)   Hunger Vital Sign    Worried About Running Out of Food in the Last Year: Never true    Ran Out of Food in the Last Year: Never true  Transportation Needs: No Transportation Needs (05/26/2024)   PRAPARE - Administrator, Civil Service (Medical): No    Lack of Transportation (Non-Medical): No  Physical Activity: Insufficiently Active (05/26/2024)   Exercise Vital Sign    Days of Exercise per Week: 1 day    Minutes of Exercise per Session: 50 min  Stress: Stress Concern Present (05/26/2024)   [employer] of Occupational Health - Occupational Stress Questionnaire    Feeling of Stress: To some extent  Social Connections: Moderately Integrated (05/26/2024)   Social Connection and Isolation Panel    Frequency of Communication with Friends and Family: More than three times a week    Frequency of Social Gatherings with Friends and Family: Once a week    Attends Religious Services: 1 to 4 times per year    Active Member of [employer] or Organizations: Yes    Attends Banker Meetings: 1 to 4 times per year    Marital Status: Never married       Objective:   Physical Exam Vitals reviewed.  Constitutional:      General: He is not in acute distress.    Appearance: He is well-developed. He is obese.  HENT:     Head: Normocephalic.     Right Ear: Tympanic membrane normal.     Left Ear: Tympanic membrane normal.  Eyes:     General:        Right eye: No discharge.        Left eye: No discharge.     Pupils: Pupils are equal, round, and reactive to light.  Neck:     Thyroid : No thyromegaly.  Cardiovascular:     Rate and Rhythm: Normal rate and regular rhythm.     Heart sounds: Normal heart sounds. No murmur heard. Pulmonary:     Effort: Pulmonary effort is normal. No respiratory distress.     Breath sounds: Normal  breath sounds. No wheezing.  Abdominal:     General: Bowel sounds are normal. There is no distension.     Palpations: Abdomen is soft.     Tenderness: There is no abdominal tenderness.  Musculoskeletal:        General: Tenderness present.     Cervical back: Normal range of motion and neck supple.     Comments: Left ring finger pain with flexion, in splint   Skin:    General: Skin is warm and dry.     Findings: No erythema or rash.  Neurological:     Mental Status: He is alert and oriented to person, place, and time.     Cranial Nerves: No cranial nerve deficit.  Deep Tendon Reflexes: Reflexes are normal and symmetric.  Psychiatric:        Behavior: Behavior normal.        Thought Content: Thought content normal.        Judgment: Judgment normal.       BP 119/81   Pulse (!) 103   Temp 97.8 F (36.6 C) (Temporal)   Ht 6' 7 (2.007 m)   Wt (!) 314 lb (142.4 kg)   BMI 35.37 kg/m      Assessment & Plan:  Derek Decker comes in today with chief complaint of Medical Management of Chronic Issues   Diagnosis and orders addressed:  1. Insulin-requiring or dependent type II diabetes mellitus (HCC) - Dulaglutide  (TRULICITY ) 4.5 MG/0.5ML SOAJ; Inject 4.5 mg into the skin once a week.  Dispense: 2 mL; Refill: 5 - insulin degludec  (TRESIBA  FLEXTOUCH) 200 UNIT/ML FlexTouch Pen; Inject 24 Units into the skin daily.  Dispense: 9 mL; Refill: 2 - CMP14+EGFR  2. Type 2 diabetes mellitus with other specified complication, without long-term current use of insulin (HCC) (Primary) - Dulaglutide  (TRULICITY ) 4.5 MG/0.5ML SOAJ; Inject 4.5 mg into the skin once a week.  Dispense: 2 mL; Refill: 5 - insulin degludec  (TRESIBA  FLEXTOUCH) 200 UNIT/ML FlexTouch Pen; Inject 24 Units into the skin daily.  Dispense: 9 mL; Refill: 2 - CMP14+EGFR  3. Hypertension associated with type 2 diabetes mellitus (HCC) - CMP14+EGFR  4. Hyperlipidemia associated with type 2 diabetes mellitus (HCC) -  CMP14+EGFR  5. Morbid obesity (HCC)  - CMP14+EGFR  6. Gastroesophageal reflux disease, unspecified whether esophagitis present - CMP14+EGFR  7. Mild intermittent asthma without complication - CMP14+EGFR  8. Hepatic steatosis - CMP14+EGFR    Labs pending Continue Trulicity  4.5 mg weekly and Tresiba  24 units Low carb diet  Strict low carb  Health Maintenance reviewed Diet and exercise encouraged  Follow up plan: 3 months    Bari Learn, FNP

## 2024-05-26 NOTE — Patient Instructions (Signed)

## 2024-05-27 LAB — CMP14+EGFR
ALT: 30 IU/L (ref 0–44)
AST: 21 IU/L (ref 0–40)
Albumin: 4.4 g/dL (ref 4.1–5.1)
Alkaline Phosphatase: 76 IU/L (ref 44–121)
BUN/Creatinine Ratio: 12 (ref 9–20)
BUN: 12 mg/dL (ref 6–20)
Bilirubin Total: 1.1 mg/dL (ref 0.0–1.2)
CO2: 23 mmol/L (ref 20–29)
Calcium: 9.7 mg/dL (ref 8.7–10.2)
Chloride: 102 mmol/L (ref 96–106)
Creatinine, Ser: 1.03 mg/dL (ref 0.76–1.27)
Globulin, Total: 2.6 g/dL (ref 1.5–4.5)
Glucose: 176 mg/dL — ABNORMAL HIGH (ref 70–99)
Potassium: 5.3 mmol/L — ABNORMAL HIGH (ref 3.5–5.2)
Sodium: 140 mmol/L (ref 134–144)
Total Protein: 7 g/dL (ref 6.0–8.5)
eGFR: 95 mL/min/1.73 (ref >59)

## 2024-05-28 ENCOUNTER — Ambulatory Visit: Payer: Self-pay | Admitting: Family

## 2024-05-28 ENCOUNTER — Encounter (HOSPITAL_COMMUNITY): Admitting: Occupational Therapy

## 2024-06-01 ENCOUNTER — Encounter (HOSPITAL_COMMUNITY): Admitting: Occupational Therapy

## 2024-06-04 ENCOUNTER — Encounter (HOSPITAL_COMMUNITY): Admitting: Occupational Therapy

## 2024-06-08 ENCOUNTER — Encounter (HOSPITAL_COMMUNITY): Admitting: Occupational Therapy

## 2024-06-10 ENCOUNTER — Encounter (HOSPITAL_COMMUNITY): Admitting: Occupational Therapy

## 2024-06-13 ENCOUNTER — Other Ambulatory Visit: Payer: Self-pay | Admitting: Family

## 2024-06-13 DIAGNOSIS — I152 Hypertension secondary to endocrine disorders: Secondary | ICD-10-CM

## 2024-06-16 ENCOUNTER — Encounter (HOSPITAL_COMMUNITY): Admitting: Occupational Therapy

## 2024-06-18 ENCOUNTER — Encounter (HOSPITAL_COMMUNITY): Admitting: Occupational Therapy

## 2024-06-23 ENCOUNTER — Encounter (HOSPITAL_COMMUNITY): Admitting: Occupational Therapy

## 2024-06-23 ENCOUNTER — Other Ambulatory Visit: Payer: Self-pay | Admitting: Family

## 2024-06-23 DIAGNOSIS — E1169 Type 2 diabetes mellitus with other specified complication: Secondary | ICD-10-CM

## 2024-06-25 ENCOUNTER — Encounter (HOSPITAL_COMMUNITY): Admitting: Occupational Therapy

## 2024-06-29 ENCOUNTER — Encounter (HOSPITAL_COMMUNITY): Admitting: Occupational Therapy

## 2024-07-01 ENCOUNTER — Encounter (HOSPITAL_COMMUNITY): Admitting: Occupational Therapy

## 2024-07-02 DIAGNOSIS — S62625D Displaced fracture of medial phalanx of left ring finger, subsequent encounter for fracture with routine healing: Secondary | ICD-10-CM | POA: Diagnosis not present

## 2024-07-02 DIAGNOSIS — M79645 Pain in left finger(s): Secondary | ICD-10-CM | POA: Diagnosis not present

## 2024-07-06 ENCOUNTER — Other Ambulatory Visit: Payer: Self-pay | Admitting: Family

## 2024-07-06 ENCOUNTER — Encounter (HOSPITAL_COMMUNITY): Admitting: Occupational Therapy

## 2024-07-06 DIAGNOSIS — K219 Gastro-esophageal reflux disease without esophagitis: Secondary | ICD-10-CM

## 2024-07-08 ENCOUNTER — Encounter (HOSPITAL_COMMUNITY): Admitting: Occupational Therapy

## 2024-07-09 DIAGNOSIS — M79645 Pain in left finger(s): Secondary | ICD-10-CM | POA: Diagnosis not present

## 2024-07-23 DIAGNOSIS — M79645 Pain in left finger(s): Secondary | ICD-10-CM | POA: Diagnosis not present

## 2024-07-27 DIAGNOSIS — M79645 Pain in left finger(s): Secondary | ICD-10-CM | POA: Diagnosis not present

## 2024-07-30 DIAGNOSIS — M79645 Pain in left finger(s): Secondary | ICD-10-CM | POA: Diagnosis not present

## 2024-08-03 DIAGNOSIS — M79645 Pain in left finger(s): Secondary | ICD-10-CM | POA: Diagnosis not present

## 2024-08-06 DIAGNOSIS — M79645 Pain in left finger(s): Secondary | ICD-10-CM | POA: Diagnosis not present

## 2024-08-10 DIAGNOSIS — M79645 Pain in left finger(s): Secondary | ICD-10-CM | POA: Diagnosis not present

## 2024-08-13 DIAGNOSIS — M79645 Pain in left finger(s): Secondary | ICD-10-CM | POA: Diagnosis not present

## 2024-08-17 DIAGNOSIS — M79645 Pain in left finger(s): Secondary | ICD-10-CM | POA: Diagnosis not present

## 2024-08-24 DIAGNOSIS — M79645 Pain in left finger(s): Secondary | ICD-10-CM | POA: Diagnosis not present

## 2024-08-26 ENCOUNTER — Other Ambulatory Visit: Payer: Self-pay | Admitting: *Deleted

## 2024-08-26 DIAGNOSIS — E1159 Type 2 diabetes mellitus with other circulatory complications: Secondary | ICD-10-CM

## 2024-08-26 DIAGNOSIS — E1169 Type 2 diabetes mellitus with other specified complication: Secondary | ICD-10-CM

## 2024-08-27 ENCOUNTER — Encounter: Payer: Self-pay | Admitting: Family

## 2024-08-27 ENCOUNTER — Ambulatory Visit: Payer: Self-pay | Admitting: Family

## 2024-08-27 VITALS — BP 131/78 | HR 101 | Temp 97.3°F | Ht 79.0 in | Wt 330.8 lb

## 2024-08-27 DIAGNOSIS — Z1159 Encounter for screening for other viral diseases: Secondary | ICD-10-CM | POA: Diagnosis not present

## 2024-08-27 DIAGNOSIS — E119 Type 2 diabetes mellitus without complications: Secondary | ICD-10-CM

## 2024-08-27 DIAGNOSIS — J4520 Mild intermittent asthma, uncomplicated: Secondary | ICD-10-CM

## 2024-08-27 DIAGNOSIS — M79645 Pain in left finger(s): Secondary | ICD-10-CM | POA: Diagnosis not present

## 2024-08-27 DIAGNOSIS — K219 Gastro-esophageal reflux disease without esophagitis: Secondary | ICD-10-CM

## 2024-08-27 DIAGNOSIS — F5101 Primary insomnia: Secondary | ICD-10-CM | POA: Diagnosis not present

## 2024-08-27 DIAGNOSIS — Z23 Encounter for immunization: Secondary | ICD-10-CM

## 2024-08-27 DIAGNOSIS — K760 Fatty (change of) liver, not elsewhere classified: Secondary | ICD-10-CM | POA: Diagnosis not present

## 2024-08-27 DIAGNOSIS — Z794 Long term (current) use of insulin: Secondary | ICD-10-CM | POA: Diagnosis not present

## 2024-08-27 DIAGNOSIS — E785 Hyperlipidemia, unspecified: Secondary | ICD-10-CM | POA: Diagnosis not present

## 2024-08-27 DIAGNOSIS — I152 Hypertension secondary to endocrine disorders: Secondary | ICD-10-CM | POA: Diagnosis not present

## 2024-08-27 DIAGNOSIS — E1169 Type 2 diabetes mellitus with other specified complication: Secondary | ICD-10-CM | POA: Diagnosis not present

## 2024-08-27 DIAGNOSIS — E1159 Type 2 diabetes mellitus with other circulatory complications: Secondary | ICD-10-CM

## 2024-08-27 LAB — BAYER DCA HB A1C WAIVED: HB A1C (BAYER DCA - WAIVED): 7.3 % — ABNORMAL HIGH (ref 4.8–5.6)

## 2024-08-27 MED ORDER — TRAZODONE HCL 50 MG PO TABS: 50.0000 mg | ORAL_TABLET | Freq: Every evening | ORAL | 3 refills | Status: AC | PRN

## 2024-08-27 MED ORDER — TRULICITY 4.5 MG/0.5ML ~~LOC~~ SOAJ: 4.5000 mg | SUBCUTANEOUS | 5 refills | Status: AC

## 2024-08-27 MED ORDER — TRESIBA FLEXTOUCH 200 UNIT/ML ~~LOC~~ SOPN: 24.0000 [IU] | PEN_INJECTOR | SUBCUTANEOUS | 2 refills | Status: AC

## 2024-08-27 MED ORDER — AMLODIPINE BESYLATE 5 MG PO TABS: 5.0000 mg | ORAL_TABLET | Freq: Every day | ORAL | 2 refills | Status: DC

## 2024-08-27 MED ORDER — OMEPRAZOLE 20 MG PO CPDR: DELAYED_RELEASE_CAPSULE | ORAL | 0 refills | Status: DC

## 2024-08-27 NOTE — Addendum Note (Signed)
 Addended by: LAVELL LYE A on: 08/27/2024 03:33 PM   Modules accepted: Orders

## 2024-08-27 NOTE — Progress Notes (Addendum)
 Subjective:    Patient ID: Derek Decker, male    DOB: [birth year], 38 y.o.   MRN: [id number]  Chief Complaint  Patient presents with   Medical Management of Chronic Issues   Pt presents to the office today for chronic follow up.    He is followed by GI for fatty liver and followed by dietitian as needed.  He is morbid obese with a BMI of 37 and DM and HTN.    He is taking Trulicity  4.5 mg weekly. His insurance will no longer pay for Ozempic  or Mounjaro .  His starting weight was 334 lb. He has lost 20 lbs but gained weight now.      08/27/2024    2:52 PM 05/26/2024    3:38 PM 04/16/2024    2:47 PM  Last 3 Weights  Weight (lbs) 330 lb 12.8 oz 314 lb 326 lb  Weight (kg) 150.05 kg 142.429 kg 147.873 kg     Hypertension This is a chronic problem. The current episode started more than 1 year ago. The problem has been resolved since onset. The problem is controlled. Pertinent negatives include no blurred vision, malaise/fatigue, peripheral edema or shortness of breath. Risk factors for coronary artery disease include dyslipidemia, diabetes mellitus, obesity and male gender. The current treatment provides moderate improvement.  Diabetes He presents for his follow-up diabetic visit. He has type 2 diabetes mellitus. Pertinent negatives for diabetes include no blurred vision and no foot paresthesias. Risk factors for coronary artery disease include dyslipidemia, diabetes mellitus, hypertension, male sex and sedentary lifestyle. He is following a generally unhealthy diet. His overall blood glucose range is 140-180 mg/dl.  Asthma There is no cough, shortness of breath or wheezing. This is a chronic problem. The problem occurs intermittently. The problem has been waxing and waning. Associated symptoms include heartburn. Pertinent negatives include no malaise/fatigue. He reports moderate improvement on treatment. His past medical history is significant for asthma.  Gastroesophageal Reflux He complains  of belching and heartburn. He reports no coughing or no wheezing. This is a chronic problem. The current episode started more than 1 year ago. The problem occurs rarely. The symptoms are aggravated by certain foods. Risk factors include obesity. He has tried a PPI for the symptoms. The treatment provided moderate relief.  Hyperlipidemia This is a chronic problem. The current episode started more than 1 year ago. The problem is controlled. Recent lipid tests were reviewed and are normal. Exacerbating diseases include obesity. Pertinent negatives include no shortness of breath. Current antihyperlipidemic treatment includes statins. The current treatment provides moderate improvement of lipids. Risk factors for coronary artery disease include dyslipidemia, diabetes mellitus, male sex, hypertension, a sedentary lifestyle and obesity.      Review of Systems  Constitutional:  Negative for malaise/fatigue.  Eyes:  Negative for blurred vision.  Respiratory:  Negative for cough, shortness of breath and wheezing.   Gastrointestinal:  Positive for heartburn.  All other systems reviewed and are negative.  Family History  Problem Relation Age of Onset   Hypertension Mother    Hypertension Father    Liver disease Neg Hx    Colon cancer Neg Hx    Social History   Socioeconomic History   Marital status: Single    Spouse name: Not on file   Number of children: Not on file   Years of education: Not on file   Highest education level: 12th grade  Occupational History   Not on file  Tobacco Use   Smoking  status: Former    Current packs/day: 0.25    Average packs/day: 0.3 packs/day for 3.0 years (0.8 ttl pk-yrs)    Types: Cigarettes    Passive exposure: Past   Smokeless tobacco: Never  Vaping Use   Vaping status: Never Used  Substance and Sexual Activity   Alcohol use: Not Currently   Drug use: No   Sexual activity: Not on file  Other Topics Concern   Not on file  Social History Narrative    Not on file   Social Drivers of Health   Financial Resource Strain: Medium Risk (08/27/2024)   Overall Financial Resource Strain (CARDIA)    Difficulty of Paying Living Expenses: Somewhat hard  Food Insecurity: No Food Insecurity (08/27/2024)   Hunger Vital Sign    Worried About Running Out of Food in the Last Year: Never true    Ran Out of Food in the Last Year: Never true  Transportation Needs: No Transportation Needs (08/27/2024)   PRAPARE - Administrator, Civil Service (Medical): No    Lack of Transportation (Non-Medical): No  Physical Activity: Sufficiently Active (08/27/2024)   Exercise Vital Sign    Days of Exercise per Week: 7 days    Minutes of Exercise per Session: 50 min  Stress: Stress Concern Present (08/27/2024)   [employer] of Occupational Health - Occupational Stress Questionnaire    Feeling of Stress: To some extent  Social Connections: Moderately Isolated (08/27/2024)   Social Connection and Isolation Panel    Frequency of Communication with Friends and Family: More than three times a week    Frequency of Social Gatherings with Friends and Family: More than three times a week    Attends Religious Services: 1 to 4 times per year    Active Member of [employer] or Organizations: No    Attends Engineer, Structural: Not on file    Marital Status: Never married       Objective:   Physical Exam Vitals reviewed.  Constitutional:      General: He is not in acute distress.    Appearance: He is well-developed. He is obese.  HENT:     Head: Normocephalic.     Right Ear: Tympanic membrane normal.     Left Ear: Tympanic membrane normal.  Eyes:     General:        Right eye: No discharge.        Left eye: No discharge.     Pupils: Pupils are equal, round, and reactive to light.  Neck:     Thyroid : No thyromegaly.  Cardiovascular:     Rate and Rhythm: Normal rate and regular rhythm.     Heart sounds: Normal heart sounds. No murmur  heard. Pulmonary:     Effort: Pulmonary effort is normal. No respiratory distress.     Breath sounds: Normal breath sounds. No wheezing.  Abdominal:     General: Bowel sounds are normal. There is no distension.     Palpations: Abdomen is soft.     Tenderness: There is no abdominal tenderness.  Musculoskeletal:        General: Tenderness present.     Cervical back: Normal range of motion and neck supple.     Comments: Left ring finger pain with flexion, in splint   Skin:    General: Skin is warm and dry.     Findings: No erythema or rash.  Neurological:     Mental Status: He is alert and oriented  to person, place, and time.     Cranial Nerves: No cranial nerve deficit.     Deep Tendon Reflexes: Reflexes are normal and symmetric.  Psychiatric:        Behavior: Behavior normal.        Thought Content: Thought content normal.        Judgment: Judgment normal.       BP 131/78   Pulse (!) 101   Temp (!) 97.3 F (36.3 C) (Temporal)   Ht 6' 7 (2.007 m)   Wt (!) 330 lb 12.8 oz (150 kg)   BMI 37.27 kg/m      Assessment & Plan:  Derek Decker comes in today with chief complaint of Medical Management of Chronic Issues   Diagnosis and orders addressed:  1. Hypertension associated with type 2 diabetes mellitus (HCC) - amLODipine  (NORVASC ) 5 MG tablet; Take 1 tablet (5 mg total) by mouth daily.  Dispense: 30 tablet; Refill: 2 - CMP14+EGFR - CBC with Differential/Platelet  2. Insulin-requiring or dependent type II diabetes mellitus (HCC) - Dulaglutide  (TRULICITY ) 4.5 MG/0.5ML SOAJ; Inject 4.5 mg into the skin once a week.  Dispense: 2 mL; Refill: 5 - insulin degludec  (TRESIBA  FLEXTOUCH) 200 UNIT/ML FlexTouch Pen; Inject 24 Units into the skin daily.  Dispense: 9 mL; Refill: 2 - CMP14+EGFR - CBC with Differential/Platelet  3. Type 2 diabetes mellitus with other specified complication, without long-term current use of insulin (HCC) (Primary)  - Dulaglutide  (TRULICITY ) 4.5  MG/0.5ML SOAJ; Inject 4.5 mg into the skin once a week.  Dispense: 2 mL; Refill: 5 - insulin degludec  (TRESIBA  FLEXTOUCH) 200 UNIT/ML FlexTouch Pen; Inject 24 Units into the skin daily.  Dispense: 9 mL; Refill: 2 - CMP14+EGFR - CBC with Differential/Platelet - Bayer DCA Hb A1c Waived  4. Gastroesophageal reflux disease, unspecified whether esophagitis present - omeprazole  (PRILOSEC) 20 MG capsule; TAKE ONE CAPSULE BY MOUTH EVERY DAY BEFORE BREAKFAST  Dispense: 90 capsule; Refill: 0 - CMP14+EGFR  5. Encounter for immunization - Flu vaccine trivalent PF, 6mos and older(Flulaval,Afluria,Fluarix,Fluzone) - CMP14+EGFR - CBC with Differential/Platelet  6. Morbid obesity (HCC)  - CMP14+EGFR - CBC with Differential/Platelet  7. Mild intermittent asthma without complication  - CMP14+EGFR  8. Hyperlipidemia associated with type 2 diabetes mellitus (HCC) - CMP14+EGFR  9. Hepatic steatosis - CMP14+EGFR  10. Need for hepatitis B screening test - Hepatitis B surface antibody,quantitative  11. Primary insomnia Start Trazodone 50 mg  Sleep ritual  - traZODone (DESYREL) 50 MG tablet; Take 1-2 tablets (50-100 mg total) by mouth at bedtime as needed for sleep.  Dispense: 60 tablet; Refill: 3  Labs pending Continue Trulicity  4.5 mg weekly and Tresiba  24 units Low carb diet  Strict low carb  Health Maintenance reviewed Diet and exercise encouraged  Follow up plan: 3 months    Bari Learn, FNP

## 2024-08-27 NOTE — Patient Instructions (Signed)

## 2024-08-28 ENCOUNTER — Ambulatory Visit: Payer: Self-pay | Admitting: Family

## 2024-08-28 LAB — CBC WITH DIFFERENTIAL/PLATELET
Basophils Absolute: 0.1 x10E3/uL (ref 0.0–0.2)
Basos: 1 %
EOS (ABSOLUTE): 0.2 x10E3/uL (ref 0.0–0.4)
Eos: 3 %
Hematocrit: 44.9 % (ref 37.5–51.0)
Hemoglobin: 14.7 g/dL (ref 13.0–17.7)
Immature Grans (Abs): 0 x10E3/uL (ref 0.0–0.1)
Immature Granulocytes: 0 %
Lymphocytes Absolute: 2.1 x10E3/uL (ref 0.7–3.1)
Lymphs: 30 %
MCH: 30.2 pg (ref 26.6–33.0)
MCHC: 32.7 g/dL (ref 31.5–35.7)
MCV: 92 fL (ref 79–97)
Monocytes Absolute: 0.4 x10E3/uL (ref 0.1–0.9)
Monocytes: 6 %
Neutrophils Absolute: 4.1 x10E3/uL (ref 1.4–7.0)
Neutrophils: 60 %
Platelets: 245 x10E3/uL (ref 150–450)
RBC: 4.87 x10E6/uL (ref 4.14–5.80)
RDW: 11.6 % (ref 11.6–15.4)
WBC: 6.9 x10E3/uL (ref 3.4–10.8)

## 2024-08-28 LAB — CMP14+EGFR
ALT: 37 IU/L (ref 0–44)
AST: 24 IU/L (ref 0–40)
Albumin: 4.6 g/dL (ref 4.1–5.1)
Alkaline Phosphatase: 70 IU/L (ref 47–123)
BUN/Creatinine Ratio: 8 — ABNORMAL LOW (ref 9–20)
BUN: 9 mg/dL (ref 6–20)
Bilirubin Total: 1.3 mg/dL — ABNORMAL HIGH (ref 0.0–1.2)
CO2: 24 mmol/L (ref 20–29)
Calcium: 9.7 mg/dL (ref 8.7–10.2)
Chloride: 103 mmol/L (ref 96–106)
Creatinine, Ser: 1.09 mg/dL (ref 0.76–1.27)
Globulin, Total: 1.9 g/dL (ref 1.5–4.5)
Glucose: 222 mg/dL — ABNORMAL HIGH (ref 70–99)
Potassium: 4.4 mmol/L (ref 3.5–5.2)
Sodium: 141 mmol/L (ref 134–144)
Total Protein: 6.5 g/dL (ref 6.0–8.5)
eGFR: 89 mL/min/1.73 (ref >59)

## 2024-08-28 LAB — HEPATITIS B SURFACE ANTIBODY, QUANTITATIVE: Hepatitis B Surf Ab Quant: <3.5 m[IU]/mL — ABNORMAL LOW

## 2024-09-14 DIAGNOSIS — M79645 Pain in left finger(s): Secondary | ICD-10-CM | POA: Diagnosis not present

## 2024-09-17 DIAGNOSIS — M79645 Pain in left finger(s): Secondary | ICD-10-CM | POA: Diagnosis not present

## 2024-09-21 DIAGNOSIS — M79645 Pain in left finger(s): Secondary | ICD-10-CM | POA: Diagnosis not present

## 2024-09-21 DIAGNOSIS — S62625D Displaced fracture of medial phalanx of left ring finger, subsequent encounter for fracture with routine healing: Secondary | ICD-10-CM | POA: Diagnosis not present

## 2024-09-24 DIAGNOSIS — M79645 Pain in left finger(s): Secondary | ICD-10-CM | POA: Diagnosis not present

## 2024-09-26 ENCOUNTER — Other Ambulatory Visit: Payer: Self-pay | Admitting: Family

## 2024-09-26 DIAGNOSIS — E1159 Type 2 diabetes mellitus with other circulatory complications: Secondary | ICD-10-CM

## 2024-09-26 DIAGNOSIS — E1169 Type 2 diabetes mellitus with other specified complication: Secondary | ICD-10-CM

## 2024-10-05 ENCOUNTER — Other Ambulatory Visit: Payer: Self-pay | Admitting: Family

## 2024-10-05 DIAGNOSIS — K219 Gastro-esophageal reflux disease without esophagitis: Secondary | ICD-10-CM

## 2024-11-06 ENCOUNTER — Telehealth: Payer: Self-pay

## 2024-11-06 ENCOUNTER — Other Ambulatory Visit (HOSPITAL_COMMUNITY): Payer: Self-pay

## 2024-11-06 NOTE — Telephone Encounter (Signed)
 Pharmacy Patient Advocate Encounter   Received notification from [facility] KEY that prior authorization for TRULICITY  4.5MG  is required/requested.   Insurance verification completed.   The patient is insured through [employer].   Per test claim: PA required; PA started via CoverMyMeds. KEY [id number] . Waiting for clinical questions to populate.

## 2024-11-09 ENCOUNTER — Other Ambulatory Visit (HOSPITAL_COMMUNITY): Payer: Self-pay

## 2024-11-09 ENCOUNTER — Telehealth: Payer: Self-pay

## 2024-11-09 NOTE — Telephone Encounter (Signed)
 Pharmacy Patient Advocate Encounter   PA required; PA submitted to above mentioned insurance via Latent Key/confirmation #/EOC [id number]. Status is pending

## 2024-11-09 NOTE — Telephone Encounter (Signed)
 Pharmacy Patient Advocate Encounter   Received notification from [employer] [facility] KEY that prior authorization for [facility] G7 SENSORS is required/requested.   Insurance verification completed.   The patient is insured through [employer].   Per test claim: PA required; PA submitted to above mentioned insurance via Latent Key/confirmation #/EOC [id number]. Status is pending

## 2024-11-10 NOTE — Progress Notes (Signed)
 Derek Decker                                          MRN: [id number]   11/10/2024   The [facility] Quality Team Specialist reviewed this patient medical record for the purposes of chart review for care gap closure. The following were reviewed: abstraction for care gap closure-diabetic eye exam.    [facility] Quality Team

## 2024-11-13 NOTE — Telephone Encounter (Signed)
 Pharmacy Patient Advocate Encounter  Received notification from [facility] that Prior Authorization for TRULICITY  has been APPROVED from 11/12/24 to 11/12/25   PA #/Case ID/Reference #: [id number]

## 2024-11-13 NOTE — Telephone Encounter (Signed)
 Pharmacy Patient Advocate Encounter  Received notification from [facility] that Prior Authorization for [facility] G7 SENSORS has been APPROVED from 11/12/24 to 11/12/25   PA #/Case ID/Reference #: [id number]

## 2024-11-27 ENCOUNTER — Ambulatory Visit: Admitting: Family
